# Patient Record
Sex: Female | Born: 1950 | Race: Black or African American | Hispanic: No | Smoking: Former smoker
Health system: Southern US, Community
[De-identification: ages and names within clinical notes are randomized; demographics above are authoritative.]

## PROBLEM LIST (undated history)

## (undated) DIAGNOSIS — F32A Depression, unspecified: Secondary | ICD-10-CM

## (undated) DIAGNOSIS — F419 Anxiety disorder, unspecified: Secondary | ICD-10-CM

## (undated) DIAGNOSIS — F1910 Other psychoactive substance abuse, uncomplicated: Secondary | ICD-10-CM

## (undated) DIAGNOSIS — M1990 Unspecified osteoarthritis, unspecified site: Secondary | ICD-10-CM

## (undated) DIAGNOSIS — D219 Benign neoplasm of connective and other soft tissue, unspecified: Secondary | ICD-10-CM

## (undated) DIAGNOSIS — K219 Gastro-esophageal reflux disease without esophagitis: Secondary | ICD-10-CM

## (undated) DIAGNOSIS — I10 Essential (primary) hypertension: Secondary | ICD-10-CM

## (undated) DIAGNOSIS — F329 Major depressive disorder, single episode, unspecified: Secondary | ICD-10-CM

## (undated) DIAGNOSIS — R0602 Shortness of breath: Secondary | ICD-10-CM

## (undated) DIAGNOSIS — I639 Cerebral infarction, unspecified: Secondary | ICD-10-CM

## (undated) HISTORY — DX: Other psychoactive substance abuse, uncomplicated: F19.10

## (undated) HISTORY — DX: Unspecified osteoarthritis, unspecified site: M19.90

## (undated) HISTORY — PX: CHOLECYSTECTOMY: SHX55

## (undated) HISTORY — PX: ABDOMINAL HYSTERECTOMY: SHX81

---

## 2010-01-27 ENCOUNTER — Ambulatory Visit: Payer: Self-pay | Admitting: Diagnostic Radiology

## 2010-01-27 ENCOUNTER — Emergency Department (HOSPITAL_BASED_OUTPATIENT_CLINIC_OR_DEPARTMENT_OTHER): Admission: EM | Admit: 2010-01-27 | Discharge: 2010-01-28 | Payer: Self-pay | Admitting: Emergency Medicine

## 2011-03-08 LAB — CBC
HCT: 40.2 % (ref 36.0–46.0)
MCHC: 33.7 g/dL (ref 30.0–36.0)
MCV: 93.8 fL (ref 78.0–100.0)
Platelets: 246 10*3/uL (ref 150–400)
RDW: 12.6 % (ref 11.5–15.5)

## 2011-03-08 LAB — BASIC METABOLIC PANEL
BUN: 11 mg/dL (ref 6–23)
Creatinine, Ser: 1.2 mg/dL (ref 0.4–1.2)
GFR calc Af Amer: 55 mL/min — ABNORMAL LOW (ref >60)
Sodium: 146 mEq/L — ABNORMAL HIGH (ref 135–145)

## 2011-03-08 LAB — DIFFERENTIAL
Eosinophils Absolute: 0.3 10*3/uL (ref 0.0–0.7)
Neutro Abs: 2 10*3/uL (ref 1.7–7.7)

## 2011-05-30 DIAGNOSIS — H35341 Macular cyst, hole, or pseudohole, right eye: Secondary | ICD-10-CM | POA: Insufficient documentation

## 2013-02-19 ENCOUNTER — Emergency Department (HOSPITAL_COMMUNITY)
Admission: EM | Admit: 2013-02-19 | Discharge: 2013-02-19 | Disposition: A | Discharge status: Home / Self Care | Payer: Medicare Other | Attending: Emergency Medicine | Admitting: Emergency Medicine

## 2013-02-19 ENCOUNTER — Encounter (HOSPITAL_COMMUNITY): Payer: Self-pay | Admitting: Emergency Medicine

## 2013-02-19 ENCOUNTER — Other Ambulatory Visit: Payer: Self-pay

## 2013-02-19 ENCOUNTER — Emergency Department (HOSPITAL_COMMUNITY): Payer: Medicare Other

## 2013-02-19 DIAGNOSIS — Z79899 Other long term (current) drug therapy: Secondary | ICD-10-CM | POA: Insufficient documentation

## 2013-02-19 DIAGNOSIS — R0982 Postnasal drip: Secondary | ICD-10-CM | POA: Insufficient documentation

## 2013-02-19 DIAGNOSIS — I10 Essential (primary) hypertension: Secondary | ICD-10-CM | POA: Insufficient documentation

## 2013-02-19 DIAGNOSIS — Z8673 Personal history of transient ischemic attack (TIA), and cerebral infarction without residual deficits: Secondary | ICD-10-CM | POA: Insufficient documentation

## 2013-02-19 DIAGNOSIS — R55 Syncope and collapse: Secondary | ICD-10-CM | POA: Insufficient documentation

## 2013-02-19 DIAGNOSIS — R0789 Other chest pain: Secondary | ICD-10-CM

## 2013-02-19 DIAGNOSIS — F411 Generalized anxiety disorder: Secondary | ICD-10-CM | POA: Insufficient documentation

## 2013-02-19 DIAGNOSIS — R42 Dizziness and giddiness: Secondary | ICD-10-CM

## 2013-02-19 DIAGNOSIS — R05 Cough: Secondary | ICD-10-CM | POA: Insufficient documentation

## 2013-02-19 DIAGNOSIS — R059 Cough, unspecified: Secondary | ICD-10-CM

## 2013-02-19 DIAGNOSIS — Z7982 Long term (current) use of aspirin: Secondary | ICD-10-CM | POA: Insufficient documentation

## 2013-02-19 DIAGNOSIS — R450 Nervousness: Secondary | ICD-10-CM | POA: Insufficient documentation

## 2013-02-19 DIAGNOSIS — R0602 Shortness of breath: Secondary | ICD-10-CM | POA: Insufficient documentation

## 2013-02-19 DIAGNOSIS — F419 Anxiety disorder, unspecified: Secondary | ICD-10-CM

## 2013-02-19 HISTORY — DX: Anxiety disorder, unspecified: F41.9

## 2013-02-19 HISTORY — DX: Essential (primary) hypertension: I10

## 2013-02-19 HISTORY — DX: Cerebral infarction, unspecified: I63.9

## 2013-02-19 LAB — BASIC METABOLIC PANEL
CO2: 33 mEq/L — ABNORMAL HIGH (ref 19–32)
Creatinine, Ser: 1.08 mg/dL (ref 0.50–1.10)
GFR calc Af Amer: 63 mL/min — ABNORMAL LOW (ref >90)
GFR calc non Af Amer: 54 mL/min — ABNORMAL LOW (ref >90)
Potassium: 4.6 mEq/L (ref 3.5–5.1)

## 2013-02-19 LAB — POCT I-STAT TROPONIN I
Troponin i, poc: 0.01 ng/mL (ref 0.00–0.08)
Troponin i, poc: 0 ng/mL (ref 0.00–0.08)

## 2013-02-19 LAB — CBC
HCT: 39 % (ref 36.0–46.0)
Hemoglobin: 13.2 g/dL (ref 12.0–15.0)
MCH: 30.3 pg (ref 26.0–34.0)
MCHC: 33.8 g/dL (ref 30.0–36.0)
MCV: 89.7 fL (ref 78.0–100.0)
Platelets: 202 K/uL (ref 150–400)
RBC: 4.35 MIL/uL (ref 3.87–5.11)
RDW: 12.9 % (ref 11.5–15.5)
WBC: 5.7 10*3/uL (ref 4.0–10.5)

## 2013-02-19 LAB — BASIC METABOLIC PANEL WITH GFR
BUN: 13 mg/dL (ref 6–23)
Calcium: 9.8 mg/dL (ref 8.4–10.5)
Chloride: 101 meq/L (ref 96–112)
Glucose, Bld: 81 mg/dL (ref 70–99)
Sodium: 140 meq/L (ref 135–145)

## 2013-02-19 MED ORDER — NITROGLYCERIN 0.4 MG SL SUBL
0.4000 mg | SUBLINGUAL_TABLET | SUBLINGUAL | Status: DC | PRN
  Administered 2013-02-19: 0.4 mg via SUBLINGUAL
  Filled 2013-02-19: qty 25

## 2013-02-19 MED ORDER — NITROGLYCERIN 2 % TD OINT
1.0000 [in_us] | TOPICAL_OINTMENT | Freq: Four times a day (QID) | TRANSDERMAL | Status: DC
Start: 2013-02-19 — End: 2013-02-19
  Administered 2013-02-19: 1 [in_us] via TOPICAL
  Filled 2013-02-19: qty 1

## 2013-02-19 MED ORDER — ALPRAZOLAM 0.25 MG PO TABS: 0.2500 mg | ORAL_TABLET | Freq: Three times a day (TID) | ORAL | Status: DC | PRN

## 2013-02-19 MED ORDER — ASPIRIN 325 MG PO TABS
325.0000 mg | ORAL_TABLET | ORAL | Status: DC
  Filled 2013-02-19: qty 1

## 2013-02-19 MED ORDER — FENTANYL CITRATE 0.05 MG/ML IJ SOLN
50.0000 ug | Freq: Once | INTRAMUSCULAR | Status: AC
  Administered 2013-02-19: 50 ug via INTRAVENOUS
  Filled 2013-02-19: qty 2

## 2013-02-19 MED ORDER — HYDROCOD POLST-CHLORPHEN POLST 10-8 MG/5ML PO LQCR: 5.0000 mL | Freq: Two times a day (BID) | ORAL | Status: DC | PRN

## 2013-02-19 MED ORDER — LORAZEPAM 2 MG/ML IJ SOLN
0.5000 mg | Freq: Once | INTRAMUSCULAR | Status: AC
  Administered 2013-02-19: 0.5 mg via INTRAVENOUS
  Filled 2013-02-19: qty 1

## 2013-02-19 NOTE — ED Notes (Signed)
Pt went to PMD this morning c/o chest pain, on EMS arrival pt anxious, crying. Left arm tingling for the last two years. Pain increasing over the last few weeks. Pt c/o left chest wall pain. Denies cardiac hx.

## 2013-02-19 NOTE — ED Notes (Signed)
X-ray at bedside

## 2013-02-19 NOTE — ED Provider Notes (Signed)
History     CSN: [id number]  Arrival date & time 02/19/13  1122   First MD Initiated Contact with Patient 02/19/13 1233      Chief Complaint  Patient presents with  . Chest Pain    (Consider location/radiation/quality/duration/timing/severity/associated sxs/prior treatment) HPI Comments: Patient with history of hypertension and anxiety and reportedly a stroke although she has no current deficit has had symptoms of dizziness, fainting due to cough and left-sided chest discomfort that radiates to her upper and posterior shoulder area. She denies sweats, nausea. Her cough has been dry but no fever or chills. She reports she has a history of cough syncope. She reports that she has been told by other providers that she likely has sinus problems causing the cough. She also has been told that she may have vertigo problems. She reports with exertion over the last few weeks, she does get shortness of breath more than usual but denies any worsening chest tightness. She denies heartburn. She and her daughter both admit that the patient has issues with anxiety. She does have Medicaid and Medicare but does not have a primary care physician. [facility] and was told that she should probably come to the emergency department for her symptoms. She did not want EMS called and her daughter decided to take her to [facility] walk-in clinic who evaluated the patient and also recommended that she be brought to the emergency department. She was given aspirin and sublingual nitroglycerin there at the clinic and reports her chest discomfort did improve from about an 810 to approximately a 5/10. She does smoke 3 or 4 cigarettes per day. She reports her mother had several medical problems including hypertension, diabetes and seems to indicate that she did have a heart attack at some point in her 81s or 62s.  Patient is a 62 y.o. female presenting with chest pain. The history is provided by the patient and a  relative.  Chest Pain Associated symptoms: cough, dizziness and shortness of breath   Associated symptoms: no diaphoresis, no fever and no nausea     Past Medical History  Diagnosis Date  . Anxiety   . Hypertension   . Stroke     History reviewed. No pertinent past surgical history.  History reviewed. No pertinent family history.  History  Substance Use Topics  . Smoking status: Not on file  . Smokeless tobacco: Not on file  . Alcohol Use: Not on file    OB History   Grav Para Term Preterm Abortions TAB SAB Ect Mult Living                  Review of Systems  Constitutional: Negative for fever, chills, diaphoresis and appetite change.  HENT: Positive for postnasal drip.   Respiratory: Positive for cough and shortness of breath.   Cardiovascular: Positive for chest pain.  Gastrointestinal: Negative for nausea and diarrhea.  Neurological: Positive for dizziness and syncope. Negative for seizures.  Psychiatric/Behavioral: The patient is nervous/anxious.   All other systems reviewed and are negative.    Allergies  Review of patient's allergies indicates no known allergies.  Home Medications   Current Outpatient Rx  Name  Route  Sig  Dispense  Refill  . aspirin 325 MG tablet   Oral   Take 325 mg by mouth daily.         . Naproxen Sodium (ALEVE PO)   Oral   Take 1 tablet by mouth daily as needed (for  pain.).         Marland Kitchen valsartan-hydrochlorothiazide (DIOVAN-HCT) 160-25 MG per tablet   Oral   Take 1 tablet by mouth daily.         Marland Kitchen ALPRAZolam (XANAX) 0.25 MG tablet   Oral   Take 1 tablet (0.25 mg total) by mouth 3 (three) times daily as needed for sleep or anxiety.   15 tablet   0   . chlorpheniramine-HYDROcodone (TUSSIONEX PENNKINETIC ER) 10-8 MG/5ML LQCR   Oral   Take 5 mLs by mouth every 12 (twelve) hours as needed.   80 mL   0     BP 125/74  Pulse 86  Temp(Src) 98.6 F (37 C) (Oral)  Resp 17  SpO2 96%  Physical Exam  Nursing note and  vitals reviewed. Constitutional: She appears well-developed and well-nourished. No distress.  HENT:  Head: Normocephalic and atraumatic.  Eyes: No scleral icterus.  Neck: Normal range of motion. Neck supple.  Cardiovascular: Normal rate and regular rhythm.   No murmur heard. Pulmonary/Chest: Effort normal. No respiratory distress.  Abdominal: Soft. She exhibits no distension. There is no tenderness. There is no rebound.  Musculoskeletal: She exhibits no edema.  Neurological: She is alert. No cranial nerve deficit. Coordination normal.  Skin: Skin is warm and dry. She is not diaphoretic.    ED Course  Procedures (including critical care time)  Labs Reviewed  BASIC METABOLIC PANEL - Abnormal; Notable for the following:    CO2 33 (*)    GFR calc non Af Amer 54 (*)    GFR calc Af Amer 63 (*)    All other components within normal limits  CBC  POCT I-STAT TROPONIN I  POCT I-STAT TROPONIN I   Dg Chest Port 1 View  02/19/2013  *RADIOLOGY REPORT*  Clinical Data: Chest pain, left axilla pain  PORTABLE CHEST - 1 VIEW  Comparison: None.  Findings: Cardiomediastinal silhouette is unremarkable.  No acute infiltrate or pleural effusion.  No pulmonary edema.  Mild degenerative changes left shoulder.  IMPRESSION: No active disease.   Original Report Authenticated By: Natasha Mead, M.D.      1. Musculoskeletal chest pain   2. Dizziness   3. Cough   4. Anxiety     EKG performed at time 11:30, shows sinus rhythm at a rate of 69, normal axis, normal intervals. Nonspecific diffuse ST elevation in inferior and lateral leads with no reciprocal changes. Interpretation is borderline EKG. No priors are available.  Room air saturation is 100% I interpret this to be normal.  4:13 PM Pt now tells me that pain is still improved, yet present at 3/10.  No change with nitropaste.  She has been coughing more in the room and affirms that coughing is making the CP worse.  She now has some reproducible CP on my  exam and I think her CP is more musculoskeletal.  If 2nd troponin is neg, will d/c with MSK CP and cough as diagnoses which fits with her atypical symptoms, no ischemia on ECG and neg troponin.    MDM  Patient's symptoms are several and not consistent with acute coronary syndrome. She does display symptoms of anxiety and has a history of same. Her EKG has some subtle nonspecific changes but no overt changes for ischemia. However given her age and risk factors and nonspecific EKG, she may warrant brief admission for cardiac evaluation. She does not currently have a primary care physician.  Continues to have CP, will give ativan and further  NTG.          Gavin Pound. Oletta Lamas, MD 02/19/13 1806

## 2013-02-19 NOTE — ED Notes (Signed)
Nitroglycerin topical ointment placed on right shoulder.

## 2013-02-19 NOTE — ED Notes (Signed)
Pt crying and appears very anxious. States her mother died very young because of her heart.

## 2013-02-19 NOTE — Discharge Instructions (Signed)
 Cough, Adult  A cough is a reflex that helps clear your throat and airways. It can help heal the body or may be a reaction to an irritated airway. A cough may only last 2 or 3 weeks (acute) or may last more than 8 weeks (chronic).  CAUSES Acute cough:  Viral or bacterial infections. Chronic cough:  Infections.  Allergies.  Asthma.  Post-nasal drip.  Smoking.  Heartburn or acid reflux.  Some medicines.  Chronic lung problems (COPD).  Cancer. SYMPTOMS   Cough.  Fever.  Chest pain.  Increased breathing rate.  High-pitched whistling sound when breathing (wheezing).  Colored mucus that you cough up (sputum). TREATMENT   A bacterial cough may be treated with antibiotic medicine.  A viral cough must run its course and will not respond to antibiotics.  Your caregiver may recommend other treatments if you have a chronic cough. HOME CARE INSTRUCTIONS   Only take over-the-counter or prescription medicines for pain, discomfort, or fever as directed by your caregiver. Use cough suppressants only as directed by your caregiver.  Use a cold steam vaporizer or humidifier in your bedroom or home to help loosen secretions.  Sleep in a semi-upright position if your cough is worse at night.  Rest as needed.  Stop smoking if you smoke. SEEK IMMEDIATE MEDICAL CARE IF:   You have pus in your sputum.  Your cough starts to worsen.  You cannot control your cough with suppressants and are losing sleep.  You begin coughing up blood.  You have difficulty breathing.  You develop pain which is getting worse or is uncontrolled with medicine.  You have a fever. MAKE SURE YOU:   Understand these instructions.  Will watch your condition.  Will get help right away if you are not doing well or get worse. Document Released: 06/01/2011 Document Revised: 02/25/2012 Document Reviewed: 06/01/2011 [facility] Patient Information 2013 [place], [place].     Dizziness Dizziness is a  common problem. It is a feeling of unsteadiness or lightheadedness. You may feel like you are about to faint. Dizziness can lead to injury if you stumble or fall. A person of any age group can suffer from dizziness, but dizziness is more common in older adults. CAUSES  Dizziness can be caused by many different things, including:  Middle ear problems.  Standing for too long.  Infections.  An allergic reaction.  Aging.  An emotional response to something, such as the sight of blood.  Side effects of medicines.  Fatigue.  Problems with circulation or blood pressure.  Excess use of alcohol, medicines, or illegal drug use.  Breathing too fast (hyperventilation).  An arrhythmia or problems with your heart rhythm.  Low red blood cell count (anemia).  Pregnancy.  Vomiting, diarrhea, fever, or other illnesses that cause dehydration.  Diseases or conditions such as Parkinson's disease, high blood pressure (hypertension), diabetes, and thyroid  problems.  Exposure to extreme heat. DIAGNOSIS  To find the cause of your dizziness, your caregiver may do a physical exam, lab tests, radiologic imaging scans, or an electrocardiography test (ECG).  TREATMENT  Treatment of dizziness depends on the cause of your symptoms and can vary greatly. HOME CARE INSTRUCTIONS   Drink enough fluids to keep your urine clear or pale yellow. This is especially important in very hot weather. In the elderly, it is also important in cold weather.  If your dizziness is caused by medicines, take them exactly as directed. When taking blood pressure medicines, it is especially important to get  up slowly.  Rise slowly from chairs and steady yourself until you feel okay.  In the morning, first sit up on the side of the bed. When this seems okay, stand slowly while holding onto something until you know your balance is fine.  If you need to stand in one place for a long time, be sure to move your legs often.  Tighten and relax the muscles in your legs while standing.  If dizziness continues to be a problem, have someone stay with you for a day or two. Do this until you feel you are well enough to stay alone. Have the person call your caregiver if he or she notices changes in you that are concerning.  Do not drive or use heavy machinery if you feel dizzy.  Do not drink alcohol. SEEK IMMEDIATE MEDICAL CARE IF:   Your dizziness or lightheadedness gets worse.  You feel nauseous or vomit.  You develop problems with talking, walking, weakness, or using your arms, hands, or legs.  You are not thinking clearly or you have difficulty forming sentences. It may take a friend or family member to determine if your thinking is normal.  You develop chest pain, abdominal pain, shortness of breath, or sweating.  Your vision changes.  You notice any bleeding.  You have side effects from medicine that seems to be getting worse rather than better. MAKE SURE YOU:   Understand these instructions.  Will watch your condition.  Will get help right away if you are not doing well or get worse. Document Released: 05/29/2001 Document Revised: 02/25/2012 Document Reviewed: 06/22/2011 [facility] Patient Information 2013 [place], [place].    RESOURCE GUIDE  Chronic Pain Problems: Contact [facility]  [phone] Patients need to be referred by their primary care doctor.  Insufficient Money for Medicine: Contact United Way:  call 211.   No Primary Care Doctor: - Call Health Connect  [phone] - can help you locate a primary care doctor that  accepts your insurance, provides certain services, etc. - Physician Referral [phone]  Agencies that provide inexpensive medical care: - [facility]  [phone] - [facility]  [phone] - [facility]  [phone] - Women's Clinic  [phone] - Planned Parenthood  [phone] [facility]   [phone]  Medicaid-accepting [facility] Providers: - [facility]- [address], [address]  [phone], Mon-Fri 9am-7pm, Sat 9am-1pm - [facility]- [address] [place], [address] [place]  [phone] - [facility]- [address] [place]  [phone] [facility]- [address]  [phone] - Kennieth Leech- [address] [place], [address], [phone]  Only accepts [place] patients after they have their name  applied to their card  Self Pay (no insurance) in [place]: - Sickle Cell Patients: Dr Camellia Hutchinson, [facility]  [address] [place], [phone] - [facility]- [address] [place]  [phone]       [facility]- [address]       -     [facility]- see information above (Speak to [employer] if you do not have insurance)       -  [facility]- [address],  [phone]       -  [facility]- [address],  [phone]       -  Dr Catalina-  [address], [place], [phone]       -  [facility] - [address] [phone]       -  [facility]- [address] [phone], also [address] [phone]       -    [facility]- [address] [place], [phone], 1st & 3rd Saturday        every month, 10am-1pm  [facility] [address] [place], [place] [address] [phone]  [facility] [address] [place], [place] [address] [phone]  1) Find a Doctor and Pay Out of Pocket Although you won't have to find out who is covered by your insurance plan, it is a good idea to ask around and get recommendations. You will then need to call the office and see if the doctor you have chosen will accept you as a new patient and what types of options they offer for patients who are  self-pay. Some doctors offer discounts or will set up payment plans for their patients who do not have insurance, but you will need to ask so you aren't surprised when you get to your appointment.  2) Contact Your Local Health Department Not all health departments have doctors that can see patients for sick visits, but many do, so it is worth a call to see if yours does. If you don't know where your local health department is, you can check in your phone book. The CDC also has a tool to help you locate your state's health department, and many state websites also have listings of all of their local health departments.  3) Find a Walk-in Clinic If your illness is not likely to be very severe or complicated, you may want to try a walk in clinic. These are popping up all over the country in pharmacies, drugstores, and shopping centers. They're usually staffed by nurse practitioners or physician assistants that have been trained to treat common illnesses and complaints. They're usually fairly quick and inexpensive. However, if you have serious medical issues or chronic medical problems, these are probably not your best option  STD Testing - [facility] Department of [facility], STD Clinic, [address], [place], phone [phone] or [phone].  Monday - Friday, call for an appointment. [facility] Department of [employer], STD Clinic, [address], [place], phone [phone] or [phone].  Monday - Friday, call for an appointment.  Abuse/Neglect: [facility] Child Abuse Hotline (318)743-8651 [facility] Child Abuse Hotline 250-762-1044 (After Hours)  Emergency Shelter:  [facility] [phone]  Maternity Homes: - Room at the [place] [facility] [phone] - [facility] ([phone]  MRSA Hotline #:   3173284084  Dental Assistance If unable to pay or uninsured, contact:  [facility]. to become qualified for the adult dental clinic.  Patients with Medicaid: [facility] [phone] [address], [phone] [address] [phone]  If unable to pay, or uninsured, contact [facility] [phone] in [place], [phone] in [facility]) to become qualified for the adult dental clinic  [employer] [address] [place], [place] [address] [phone] [email or web address]  Other Proofreader Services: Chartered loss adjuster [facility]- [address], [place],  [place], [address], [phone], Ext. [phone], 2nd and 4th Thursday of the month at 6:30am.  10 clients each day by appointment, can sometimes see walk-in patients if someone does not show for an appointment. [facility]- [address] [place], [place], [address], [phone] - [facility] [address] [place], [place], [address], [phone] - [facility]- [phone] [facility] Health Department- [phone] [facility] Health [phone]       Behavioral Health Resources in the [facility]  Intensive Outpatient Programs: [facility]      [address] [place], [place] [phone] Both a day and evening program       [facility] Outpatient     [address]        [place], [place] [address] [phone]         ADS: [facility] Drug [facility] [address] [place] [phone]  [facility] Mental Health ACCESS LINE: [phone] or [phone] [address] [place], [place] [address] [email or web address]   Substance Abuse Resources: - Alcohol [facility] Drug Services  ([phone] - [facility] [phone] - [facility] [place] [phone] [facility] [place] [phone] - Residential & Outpatient Substance Abuse Program  ([phone]  Psychological Services: [facility]   [phone] [facility]  [phone] - [facility], [phone] [place]. [address], [place], ACCESS LINE: [phone] or [phone], [email or web address]  Mobile Crisis Teams:                                        Therapeutic Alternatives         Mobile Crisis Care Unit ([phone]             [facility] [address] [place] [phone]                                         Interventionist [address] [place] [address] [place] [phone]  Self-Help/Support Groups: Mental Health Assoc. of [employer] of support groups 339-191-6567 (call for more info)   Narcotics Anonymous (NA) Caring [facility] [address] [place] - 2 meetings at this location  Residential Treatment Programs:  [facility]      [address]        [place]       [phone]         [facility] [address] [place] [address]81 [place], [place]  [address] [phone]  [facility]  [address] [place], [place] [address] [phone] Admissions: 8am-3pm M-F  [facility]     [address]        [place], [place] [address]       [phone]         [facility] [address] Christianna COYER [place], [place] [phone]  [facility] [address] [place], [place] [phone]  [facility] - Intensive Outpatient      [address]     [place], [place]       [phone]         [facility] (Addiction Recovery Care [facility].)     [address] [place], [place]  [phone] or [phone]  Residential Treatment Services (RTS), Medicaid [address] [place], [place] [phone]  Fellowship [address]                                               [address] [place] [phone]  [facility] Resources: [phone]                General Therapy                                                Mliss Rival, PhD        [address] [place], [place] [address]         [phone]   Insurance  [facility] - Closed Behavioral   [address] [place], [place] [address] [phone]  [facility] Recovery [address] [place], [place] [address] [phone] Insurance/Medicaid/sponsorship through [facility] and Families                                              [address]                                        [place], [place] [address]    Therapy/tele-psych/case         [phone]          [facility] [address], [place]  [address]  Adolescent/group home/case management [phone]                                           Recardo Rival PhD       General therapy       Insurance   [phone]         Dr. Curry, [place], M-F [phone]  [facility] [place]  [employer] [facility] Dept. [address]                 [address]         [address] [place] [address]  [place]                                               Keenan Keenan Phone:  [phone]  Phone:  [phone]                   Phone:  [phone]  [facility], [phone] - [facility] - [phone]       -     [facility] in [place], [address]             [phone], Insurance  [place] Child Abuse Hotline 270-214-8688 or [phone] (After Hours)

## 2013-03-05 ENCOUNTER — Emergency Department (INDEPENDENT_AMBULATORY_CARE_PROVIDER_SITE_OTHER)
Admission: EM | Admit: 2013-03-05 | Discharge: 2013-03-05 | Disposition: A | Discharge status: Home / Self Care | Payer: Medicare Other | Source: Home / Self Care

## 2013-03-05 ENCOUNTER — Encounter (HOSPITAL_COMMUNITY): Payer: Self-pay

## 2013-03-05 DIAGNOSIS — R071 Chest pain on breathing: Secondary | ICD-10-CM

## 2013-03-05 MED ORDER — IBUPROFEN 600 MG PO TABS: 600.0000 mg | ORAL_TABLET | Freq: Four times a day (QID) | ORAL | Status: DC | PRN

## 2013-03-05 NOTE — ED Notes (Signed)
Hospital follow up?

## 2013-03-05 NOTE — ED Notes (Signed)
Patient Demographics  Deborah Whitehead, is a 62 y.o. female  ZOX:[id number]  WJX:[id number]  DOB - August 10, 1951  Chief Complaint  Patient presents with  . Follow-up        Subjective:   Deborah Whitehead  with a history of smoking, who was recently in the ER for left-sided chest pain which has been ongoing for one month, in the ER EKG troponins were negative, she currently has costochondritis on exam. Denies any palpitations or shortness of breath, worse with touching, no correlation with exertion. Denies any previous heart or breast issues.  Objective:    Filed Vitals:   03/05/13 1408  BP: 147/90  Pulse: 84  Temp: 97.6 F (36.4 C)  TempSrc: Oral  SpO2: 100%     Exam  Awake Alert, Oriented X 3, No new F.N deficits, Normal affect [place].AT,PERRAL Supple Neck,No JVD, No cervical lymphadenopathy appriciated.  Symmetrical Chest wall movement, Good air movement bilaterally, CTAB, point tenderness in the left rib cage area above her breast around the third and fourth ribs. RRR,No Gallops,Rubs or new Murmurs, No Parasternal Heave +ve B.Sounds, Abd Soft, Non tender, No organomegaly appriciated, No rebound - guarding or rigidity. No Cyanosis, Clubbing or edema, No new Rash or bruise       Data Review   CBC No results found for this basename: WBC, HGB, HCT, PLT, MCV, MCH, MCHC, RDW, NEUTRABS, LYMPHSABS, MONOABS, EOSABS, BASOSABS, BANDABS, BANDSABD,  in the last 168 hours  Chemistries   No results found for this basename: NA, K, CL, CO2, GLUCOSE, BUN, CREATININE, GFRCGP, CALCIUM, MG, AST, ALT, ALKPHOS, BILITOT,  in the last 168 hours ------------------------------------------------------------------------------------------------------------------ No results found for this basename: HGBA1C,  in the last 72 hours ------------------------------------------------------------------------------------------------------------------ No results found for this basename: CHOL, HDL, LDLCALC,  TRIG, CHOLHDL, LDLDIRECT,  in the last 72 hours ------------------------------------------------------------------------------------------------------------------ No results found for this basename: TSH, T4TOTAL, FREET3, T3FREE, THYROIDAB,  in the last 72 hours ------------------------------------------------------------------------------------------------------------------ No results found for this basename: VITAMINB12, FOLATE, FERRITIN, TIBC, IRON, RETICCTPCT,  in the last 72 hours  Coagulation profile  No results found for this basename: INR, PROTIME,  in the last 168 hours     Prior to Admission medications   Medication Sig Start Date End Date Taking? Authorizing Provider  ALPRAZolam (XANAX) 0.25 MG tablet Take 1 tablet (0.25 mg total) by mouth 3 (three) times daily as needed for sleep or anxiety. 02/19/13   Gavin Pound. Ghim, MD  aspirin 325 MG tablet Take 325 mg by mouth daily.    Historical Provider, MD  chlorpheniramine-HYDROcodone (TUSSIONEX PENNKINETIC ER) 10-8 MG/5ML LQCR Take 5 mLs by mouth every 12 (twelve) hours as needed. 02/19/13   Gavin Pound. Ghim, MD  ibuprofen (MOTRIN IB) 600 MG tablet Take 1 tablet (600 mg total) by mouth every 6 (six) hours as needed for pain. 03/05/13   Leroy Sea, MD  valsartan-hydrochlorothiazide (DIOVAN-HCT) 160-25 MG per tablet Take 1 tablet by mouth daily.    Historical Provider, MD     Assessment & Plan     CostoChondritis of the left chest wall. Patient was recently in the ER where EKG and troponins were negative, pain has been going for one month, she has point tenderness, she would be prescribed Motrin, come back in a month.   Hypertension stable continue home medications.   Smoking counseled to quit smoking.   Patient needs referral for mammogram, woman's health referral form provided.   Follow-up Information   Follow up with this clinic. Schedule an appointment  as soon as possible for a visit in 1 month.       Leroy Sea M.D on 03/05/2013 at 2:30 PM   Leroy Sea, MD 03/05/13 [phone]

## 2013-05-06 ENCOUNTER — Encounter (INDEPENDENT_AMBULATORY_CARE_PROVIDER_SITE_OTHER): Payer: Self-pay

## 2013-05-13 ENCOUNTER — Ambulatory Visit (INDEPENDENT_AMBULATORY_CARE_PROVIDER_SITE_OTHER): Payer: Medicare Other | Admitting: General Surgery

## 2013-05-13 ENCOUNTER — Encounter (INDEPENDENT_AMBULATORY_CARE_PROVIDER_SITE_OTHER): Payer: Self-pay | Admitting: General Surgery

## 2013-05-13 VITALS — BP 130/80 | HR 80 | Temp 98.6°F | Resp 18 | Ht 63.0 in | Wt 221.4 lb

## 2013-05-13 DIAGNOSIS — R109 Unspecified abdominal pain: Secondary | ICD-10-CM | POA: Insufficient documentation

## 2013-05-13 NOTE — Patient Instructions (Signed)
Will get CT abdomen and pelvis to look for hernia

## 2013-05-13 NOTE — Progress Notes (Signed)
Subjective:     Patient ID: Deborah Whitehead, female   DOB: August 30, 1951, 62 y.o.   MRN: [id number]  HPI We are asked to see the patient in consultation by Dr. Pecola Leisure to evaluate her for an abdominal wall hernia. The patient is a 62 year old black female who states that she has had a bulge at the upper portion of her midline abdominal incision for about the last 5 years. During that time it does not seem to have gotten larger but she seems to be having more discomfort associated with it. She has had several episodes of nausea and vomiting. Her bowels are moving regularly and her appetite is good.  Review of Systems  Constitutional: Negative.   HENT: Negative.   Eyes: Negative.   Respiratory: Positive for cough.   Cardiovascular: Negative.   Gastrointestinal: Positive for abdominal pain.  Endocrine: Negative.   Genitourinary: Negative.   Musculoskeletal: Negative.   Skin: Negative.   Allergic/Immunologic: Negative.   Neurological: Negative.   Hematological: Negative.   Psychiatric/Behavioral: Negative.        Objective:   Physical Exam  Constitutional: She is oriented to person, place, and time. She appears well-developed and well-nourished.  HENT:  Head: Normocephalic and atraumatic.  Eyes: Conjunctivae and EOM are normal. Pupils are equal, round, and reactive to light.  Neck: Normal range of motion. Neck supple.  Cardiovascular: Normal rate, regular rhythm and normal heart sounds.   Pulmonary/Chest: Effort normal and breath sounds normal.  Abdominal: Soft. Bowel sounds are normal.  There is some slight fullness at the upper portion of her midline incision but I cannot appreciate a palpable fascial defect or any reducible hernia  Musculoskeletal: Normal range of motion.  Neurological: She is alert and oriented to person, place, and time.  Skin: Skin is warm and dry.  Psychiatric: She has a normal mood and affect. Her behavior is normal.       Assessment:     The patient has  some abdominal pain at the upper portion of her old incision but I cannot definitively appreciate a fascial defect consistent with a hernia. Because of this I think she should be evaluated with a CT scan of her abdomen and pelvis to look at her anatomy. If she does have a hernia I have discussed with her the different options for repairing it including open versus laparoscopic. I think she would be a good laparoscopic candidate. I have discussed with her in detail the risks and benefits of the operation to fix the hernia laparoscopically as well as some of the technical aspects and she understands and wishes to proceed     Plan:     We will plan to obtain a CT scan of her abdomen and pelvis and then proceed accordingly. We will call her with the results of the study

## 2013-05-15 ENCOUNTER — Ambulatory Visit
Admission: RE | Admit: 2013-05-15 | Discharge: 2013-05-15 | Disposition: A | Discharge status: Home / Self Care | Payer: Medicare Other | Source: Ambulatory Visit | Attending: General Surgery | Admitting: General Surgery

## 2013-05-15 ENCOUNTER — Other Ambulatory Visit (INDEPENDENT_AMBULATORY_CARE_PROVIDER_SITE_OTHER): Payer: Self-pay | Admitting: General Surgery

## 2013-05-15 DIAGNOSIS — R109 Unspecified abdominal pain: Secondary | ICD-10-CM

## 2013-05-15 MED ORDER — IOHEXOL 300 MG/ML  SOLN
125.0000 mL | Freq: Once | INTRAMUSCULAR | Status: AC | PRN
  Administered 2013-05-15: 125 mL via INTRAVENOUS

## 2013-06-10 ENCOUNTER — Encounter (HOSPITAL_COMMUNITY): Payer: Self-pay | Admitting: Pharmacy Technician

## 2013-06-17 ENCOUNTER — Encounter (HOSPITAL_COMMUNITY): Payer: Self-pay

## 2013-06-17 ENCOUNTER — Ambulatory Visit (HOSPITAL_COMMUNITY)
Admission: RE | Admit: 2013-06-17 | Discharge: 2013-06-17 | Disposition: A | Discharge status: Home / Self Care | Payer: Medicare Other | Source: Ambulatory Visit | Attending: General Surgery | Admitting: General Surgery

## 2013-06-17 ENCOUNTER — Encounter (HOSPITAL_COMMUNITY)
Admission: RE | Admit: 2013-06-17 | Discharge: 2013-06-17 | Disposition: A | Discharge status: Home / Self Care | Payer: Medicare Other | Source: Ambulatory Visit | Attending: General Surgery | Admitting: General Surgery

## 2013-06-17 DIAGNOSIS — M954 Acquired deformity of chest and rib: Secondary | ICD-10-CM | POA: Insufficient documentation

## 2013-06-17 DIAGNOSIS — Z01818 Encounter for other preprocedural examination: Secondary | ICD-10-CM | POA: Insufficient documentation

## 2013-06-17 DIAGNOSIS — Z01812 Encounter for preprocedural laboratory examination: Secondary | ICD-10-CM | POA: Insufficient documentation

## 2013-06-17 DIAGNOSIS — K469 Unspecified abdominal hernia without obstruction or gangrene: Secondary | ICD-10-CM | POA: Insufficient documentation

## 2013-06-17 HISTORY — DX: Depression, unspecified: F32.A

## 2013-06-17 HISTORY — DX: Major depressive disorder, single episode, unspecified: F32.9

## 2013-06-17 HISTORY — DX: Shortness of breath: R06.02

## 2013-06-17 HISTORY — DX: Benign neoplasm of connective and other soft tissue, unspecified: D21.9

## 2013-06-17 HISTORY — DX: Gastro-esophageal reflux disease without esophagitis: K21.9

## 2013-06-17 LAB — BASIC METABOLIC PANEL
Calcium: 9.6 mg/dL (ref 8.4–10.5)
Chloride: 97 mEq/L (ref 96–112)
Creatinine, Ser: 1.14 mg/dL — ABNORMAL HIGH (ref 0.50–1.10)
GFR calc Af Amer: 59 mL/min — ABNORMAL LOW (ref >90)

## 2013-06-17 LAB — CBC
MCV: 92.2 fL (ref 78.0–100.0)
Platelets: 234 10*3/uL (ref 150–400)
RDW: 13.1 % (ref 11.5–15.5)
WBC: 5.4 10*3/uL (ref 4.0–10.5)

## 2013-06-24 MED ORDER — CHLORHEXIDINE GLUCONATE 4 % EX LIQD: 1.0000 "application " | Freq: Once | CUTANEOUS | Status: DC

## 2013-06-24 MED ORDER — CEFAZOLIN SODIUM-DEXTROSE 2-3 GM-% IV SOLR
2.0000 g | INTRAVENOUS | Status: AC
  Administered 2013-06-25: 2 g via INTRAVENOUS
  Filled 2013-06-24: qty 50

## 2013-06-25 ENCOUNTER — Ambulatory Visit (HOSPITAL_COMMUNITY): Payer: Medicare Other | Admitting: Anesthesiology

## 2013-06-25 ENCOUNTER — Encounter (HOSPITAL_COMMUNITY)
Admission: RE | Disposition: A | Discharge status: Home / Self Care | Payer: Self-pay | Source: Ambulatory Visit | Attending: General Surgery

## 2013-06-25 ENCOUNTER — Encounter (HOSPITAL_COMMUNITY): Payer: Self-pay | Admitting: *Deleted

## 2013-06-25 ENCOUNTER — Encounter (HOSPITAL_COMMUNITY): Payer: Self-pay | Admitting: Anesthesiology

## 2013-06-25 ENCOUNTER — Inpatient Hospital Stay (HOSPITAL_COMMUNITY)
Admission: RE | Admit: 2013-06-25 | Discharge: 2013-06-29 | DRG: 355 | Disposition: A | Discharge status: Home / Self Care | Payer: Medicare Other | Source: Ambulatory Visit | Attending: General Surgery | Admitting: General Surgery

## 2013-06-25 DIAGNOSIS — R109 Unspecified abdominal pain: Secondary | ICD-10-CM

## 2013-06-25 DIAGNOSIS — K439 Ventral hernia without obstruction or gangrene: Principal | ICD-10-CM | POA: Diagnosis present

## 2013-06-25 HISTORY — PX: INSERTION OF MESH: SHX5868

## 2013-06-25 HISTORY — PX: VENTRAL HERNIA REPAIR: SHX424

## 2013-06-25 SURGERY — REPAIR, HERNIA, VENTRAL, LAPAROSCOPIC
Anesthesia: General | Site: Abdomen | Wound class: Clean

## 2013-06-25 MED ORDER — SODIUM CHLORIDE 0.9 % IR SOLN
Status: DC | PRN
  Administered 2013-06-25: 1000 mL

## 2013-06-25 MED ORDER — MORPHINE SULFATE 4 MG/ML IJ SOLN
INTRAMUSCULAR | Status: AC
  Filled 2013-06-25: qty 1

## 2013-06-25 MED ORDER — HYDROMORPHONE HCL PF 1 MG/ML IJ SOLN
INTRAMUSCULAR | Status: AC
  Filled 2013-06-25: qty 1

## 2013-06-25 MED ORDER — ALPRAZOLAM 0.25 MG PO TABS
0.2500 mg | ORAL_TABLET | Freq: Three times a day (TID) | ORAL | Status: DC | PRN
  Administered 2013-06-25: 0.25 mg via ORAL
  Filled 2013-06-25: qty 1

## 2013-06-25 MED ORDER — IRBESARTAN 150 MG PO TABS
150.0000 mg | ORAL_TABLET | Freq: Every day | ORAL | Status: DC
  Administered 2013-06-25 – 2013-06-29 (×4): 150 mg via ORAL
  Filled 2013-06-25 (×6): qty 1

## 2013-06-25 MED ORDER — ASPIRIN EC 81 MG PO TBEC
81.0000 mg | DELAYED_RELEASE_TABLET | Freq: Every day | ORAL | Status: DC
  Administered 2013-06-26 – 2013-06-29 (×4): 81 mg via ORAL
  Filled 2013-06-25 (×5): qty 1

## 2013-06-25 MED ORDER — ONDANSETRON HCL 4 MG PO TABS: 4.0000 mg | ORAL_TABLET | Freq: Four times a day (QID) | ORAL | Status: DC | PRN

## 2013-06-25 MED ORDER — PROPOFOL 10 MG/ML IV BOLUS
INTRAVENOUS | Status: DC | PRN
  Administered 2013-06-25: 150 mg via INTRAVENOUS

## 2013-06-25 MED ORDER — MIDAZOLAM HCL 2 MG/2ML IJ SOLN
INTRAMUSCULAR | Status: AC
  Filled 2013-06-25: qty 2

## 2013-06-25 MED ORDER — HYDROMORPHONE HCL PF 1 MG/ML IJ SOLN
0.2500 mg | INTRAMUSCULAR | Status: DC | PRN
  Administered 2013-06-25 (×4): 0.5 mg via INTRAVENOUS

## 2013-06-25 MED ORDER — NEOSTIGMINE METHYLSULFATE 1 MG/ML IJ SOLN
INTRAMUSCULAR | Status: DC | PRN
  Administered 2013-06-25: 4 mg via INTRAVENOUS

## 2013-06-25 MED ORDER — MIDAZOLAM HCL 5 MG/5ML IJ SOLN
INTRAMUSCULAR | Status: DC | PRN
  Administered 2013-06-25: 2 mg via INTRAVENOUS

## 2013-06-25 MED ORDER — VALSARTAN-HYDROCHLOROTHIAZIDE 160-25 MG PO TABS: 1.0000 | ORAL_TABLET | Freq: Every day | ORAL | Status: DC

## 2013-06-25 MED ORDER — FENTANYL CITRATE 0.05 MG/ML IJ SOLN
INTRAMUSCULAR | Status: DC | PRN
  Administered 2013-06-25 (×2): 50 ug via INTRAVENOUS
  Administered 2013-06-25: 100 ug via INTRAVENOUS
  Administered 2013-06-25: 50 ug via INTRAVENOUS

## 2013-06-25 MED ORDER — BUPIVACAINE-EPINEPHRINE 0.25% -1:200000 IJ SOLN
INTRAMUSCULAR | Status: DC | PRN
  Administered 2013-06-25: 14 mL

## 2013-06-25 MED ORDER — ONDANSETRON HCL 4 MG/2ML IJ SOLN
4.0000 mg | Freq: Four times a day (QID) | INTRAMUSCULAR | Status: DC | PRN
  Administered 2013-06-25 (×2): 4 mg via INTRAVENOUS
  Filled 2013-06-25 (×2): qty 2

## 2013-06-25 MED ORDER — MIDAZOLAM HCL 2 MG/2ML IJ SOLN
0.5000 mg | Freq: Once | INTRAMUSCULAR | Status: AC
  Administered 2013-06-25: 0.5 mg via INTRAVENOUS

## 2013-06-25 MED ORDER — HYDROCHLOROTHIAZIDE 25 MG PO TABS
25.0000 mg | ORAL_TABLET | Freq: Every day | ORAL | Status: DC
  Administered 2013-06-25 – 2013-06-29 (×4): 25 mg via ORAL
  Filled 2013-06-25 (×5): qty 1

## 2013-06-25 MED ORDER — MORPHINE SULFATE 4 MG/ML IJ SOLN
4.0000 mg | INTRAMUSCULAR | Status: DC | PRN
  Administered 2013-06-25 (×3): 4 mg via INTRAVENOUS
  Filled 2013-06-25: qty 1

## 2013-06-25 MED ORDER — ROCURONIUM BROMIDE 100 MG/10ML IV SOLN
INTRAVENOUS | Status: DC | PRN
  Administered 2013-06-25: 50 mg via INTRAVENOUS

## 2013-06-25 MED ORDER — ONDANSETRON HCL 4 MG/2ML IJ SOLN
INTRAMUSCULAR | Status: DC | PRN
  Administered 2013-06-25: 4 mg via INTRAVENOUS

## 2013-06-25 MED ORDER — GLYCOPYRROLATE 0.2 MG/ML IJ SOLN
INTRAMUSCULAR | Status: DC | PRN
  Administered 2013-06-25: 0.6 mg via INTRAVENOUS

## 2013-06-25 MED ORDER — KCL IN DEXTROSE-NACL 20-5-0.9 MEQ/L-%-% IV SOLN
INTRAVENOUS | Status: DC
  Administered 2013-06-25 – 2013-06-26 (×2): via INTRAVENOUS
  Filled 2013-06-25 (×7): qty 1000

## 2013-06-25 MED ORDER — LACTATED RINGERS IV SOLN
INTRAVENOUS | Status: DC | PRN
  Administered 2013-06-25: 07:00:00 via INTRAVENOUS

## 2013-06-25 MED ORDER — BUPIVACAINE-EPINEPHRINE PF 0.25-1:200000 % IJ SOLN
INTRAMUSCULAR | Status: AC
  Filled 2013-06-25: qty 30

## 2013-06-25 MED ORDER — LIDOCAINE HCL (CARDIAC) 20 MG/ML IV SOLN
INTRAVENOUS | Status: DC | PRN
  Administered 2013-06-25: 60 mg via INTRAVENOUS

## 2013-06-25 MED ORDER — HYDROCODONE-ACETAMINOPHEN 5-325 MG PO TABS
1.0000 | ORAL_TABLET | ORAL | Status: DC | PRN
Start: 2013-06-25 — End: 2013-06-29
  Administered 2013-06-25: 2 via ORAL
  Administered 2013-06-25 – 2013-06-26 (×2): 1 via ORAL
  Administered 2013-06-26 – 2013-06-29 (×10): 2 via ORAL
  Filled 2013-06-25 (×13): qty 2

## 2013-06-25 MED ORDER — HYDROCODONE-ACETAMINOPHEN 5-325 MG PO TABS
ORAL_TABLET | ORAL | Status: AC
  Filled 2013-06-25: qty 2

## 2013-06-25 SURGICAL SUPPLY — 41 items
APPLIER CLIP LOGIC TI 5 (MISCELLANEOUS) IMPLANT
APPLIER CLIP ROT 10 11.4 M/L (STAPLE)
BANDAGE GAUZE ELAST BULKY 4 IN (GAUZE/BANDAGES/DRESSINGS) IMPLANT
CANISTER SUCTION 2500CC (MISCELLANEOUS) IMPLANT
CHLORAPREP W/TINT 26ML (MISCELLANEOUS) ×2 IMPLANT
CLIP APPLIE ROT 10 11.4 M/L (STAPLE) IMPLANT
CLOTH BEACON ORANGE TIMEOUT ST (SAFETY) ×2 IMPLANT
COVER SURGICAL LIGHT HANDLE (MISCELLANEOUS) ×2 IMPLANT
DECANTER SPIKE VIAL GLASS SM (MISCELLANEOUS) ×2 IMPLANT
DERMABOND ADVANCED (GAUZE/BANDAGES/DRESSINGS) ×1
DERMABOND ADVANCED .7 DNX12 (GAUZE/BANDAGES/DRESSINGS) ×1 IMPLANT
DEVICE SECURE STRAP 25 ABSORB (INSTRUMENTS) ×2 IMPLANT
DEVICE TROCAR PUNCTURE CLOSURE (ENDOMECHANICALS) ×2 IMPLANT
DRAPE INCISE IOBAN 66X45 STRL (DRAPES) ×2 IMPLANT
DRAPE UTILITY 15X26 W/TAPE STR (DRAPE) ×4 IMPLANT
ELECT CAUTERY BLADE 6.4 (BLADE) ×2 IMPLANT
ELECT REM PT RETURN 9FT ADLT (ELECTROSURGICAL) ×2
ELECTRODE REM PT RTRN 9FT ADLT (ELECTROSURGICAL) ×1 IMPLANT
GLOVE BIO SURGEON STRL SZ7.5 (GLOVE) ×2 IMPLANT
GOWN STRL NON-REIN LRG LVL3 (GOWN DISPOSABLE) ×6 IMPLANT
KIT BASIN OR (CUSTOM PROCEDURE TRAY) ×2 IMPLANT
KIT ROOM TURNOVER OR (KITS) ×2 IMPLANT
MARKER SKIN DUAL TIP RULER LAB (MISCELLANEOUS) ×2 IMPLANT
MESH VENTRALIGHT ST 4.5IN (Mesh General) ×2 IMPLANT
NEEDLE SPNL 22GX3.5 QUINCKE BK (NEEDLE) ×2 IMPLANT
NS IRRIG 1000ML POUR BTL (IV SOLUTION) ×2 IMPLANT
PAD ARMBOARD 7.5X6 YLW CONV (MISCELLANEOUS) ×2 IMPLANT
PENCIL BUTTON HOLSTER BLD 10FT (ELECTRODE) ×2 IMPLANT
SCALPEL HARMONIC ACE (MISCELLANEOUS) IMPLANT
SCISSORS LAP 5X35 DISP (ENDOMECHANICALS) IMPLANT
SET IRRIG TUBING LAPAROSCOPIC (IRRIGATION / IRRIGATOR) IMPLANT
SLEEVE ENDOPATH XCEL 5M (ENDOMECHANICALS) ×2 IMPLANT
SUT MNCRL AB 4-0 PS2 18 (SUTURE) ×2 IMPLANT
SUT NOVA NAB DX-16 0-1 5-0 T12 (SUTURE) ×2 IMPLANT
TOWEL OR 17X24 6PK STRL BLUE (TOWEL DISPOSABLE) ×2 IMPLANT
TOWEL OR 17X26 10 PK STRL BLUE (TOWEL DISPOSABLE) ×2 IMPLANT
TRAY FOLEY CATH 16FRSI W/METER (SET/KITS/TRAYS/PACK) ×2 IMPLANT
TRAY LAPAROSCOPIC (CUSTOM PROCEDURE TRAY) ×2 IMPLANT
TROCAR XCEL BLUNT TIP 100MML (ENDOMECHANICALS) IMPLANT
TROCAR XCEL NON-BLD 11X100MML (ENDOMECHANICALS) ×2 IMPLANT
TROCAR XCEL NON-BLD 5MMX100MML (ENDOMECHANICALS) ×2 IMPLANT

## 2013-06-25 NOTE — Interval H&P Note (Signed)
History and Physical Interval Note:  06/25/2013 7:21 AM  Deborah Whitehead  has presented today for surgery, with the diagnosis of ventral hernia  The various methods of treatment have been discussed with the patient and family. After consideration of risks, benefits and other options for treatment, the patient has consented to  Procedure(s): LAPAROSCOPIC VENTRAL HERNIA (N/A) INSERTION OF MESH (N/A) as a surgical intervention .  The patient's history has been reviewed, patient examined, no change in status, stable for surgery.  I have reviewed the patient's chart and labs.  Questions were answered to the patient's satisfaction.     TOTH III,PAUL S

## 2013-06-25 NOTE — Anesthesia Preprocedure Evaluation (Addendum)
Anesthesia Evaluation  Patient identified by MRN, date of birth, ID band Patient awake    Reviewed: Allergy & Precautions, H&P   History of Anesthesia Complications Negative for: history of anesthetic complications  Airway Mallampati: II      Dental  (+) Edentulous Upper and Edentulous Lower   Pulmonary shortness of breath and with exertion, Current Smoker,  breath sounds clear to auscultation        Cardiovascular hypertension, Pt. on medications Rhythm:Regular Rate:Normal     Neuro/Psych Anxiety Depression CVA (left side weaker than right), Residual Symptoms    GI/Hepatic Neg liver ROS, GERD-  Medicated and Controlled,  Endo/Other  negative endocrine ROS  Renal/GU negative Renal ROS     Musculoskeletal   Abdominal   Peds  Hematology   Anesthesia Other Findings   Reproductive/Obstetrics                         Anesthesia Physical Anesthesia Plan  ASA: III  Anesthesia Plan: General   Post-op Pain Management:    Induction: Intravenous  Airway Management Planned: Oral ETT  Additional Equipment:   Intra-op Plan:   Post-operative Plan: Extubation in OR  Informed Consent:   Dental advisory given  Plan Discussed with: CRNA, Anesthesiologist and Surgeon  Anesthesia Plan Comments:         Anesthesia Quick Evaluation

## 2013-06-25 NOTE — H&P (Signed)
TISHEENA MAGUIRE  05/13/2013 1:50 PM   Office Visit  MRN:  [id number]   Description: 62 year old Deborah Whitehead  Provider: Robyne Askew, MD  Department: [facility]        Diagnoses    Abdominal pain, unspecified site    -  Primary    789.00      Reason for Visit    New Evaluation    eval abd hernia        Current Vitals - Last Recorded    BP Pulse Temp(Src) Resp Ht Wt    130/80 80 98.6 F (37 C) (Temporal) 18 5\' 3"  (1.6 m) 221 lb 6.4 oz (100.426 kg)       BMI              39.23 kg/m2                 Progress Notes    Robyne Askew, MD at 05/13/2013  2:15 PM    Status: Signed                   Subjective:       Patient ID: Deborah Whitehead, Deborah Whitehead   DOB: [birth year], 62 y.o.   MRN: [id number]   HPI We are asked to see the patient in consultation by Dr. Pecola Leisure to evaluate her for an abdominal wall hernia. The patient is a 62 year old black Deborah Whitehead who states that she has had a bulge at the upper portion of her midline abdominal incision for about the last 5 years. During that time it does not seem to have gotten larger but she seems to be having more discomfort associated with it. She has had several episodes of nausea and vomiting. Her bowels are moving regularly and her appetite is good.   Review of Systems  Constitutional: Negative.   HENT: Negative.   Eyes: Negative.   Respiratory: Positive for cough.   Cardiovascular: Negative.   Gastrointestinal: Positive for abdominal pain.  Endocrine: Negative.   Genitourinary: Negative.   Musculoskeletal: Negative.   Skin: Negative.   Allergic/Immunologic: Negative.   Neurological: Negative.   Hematological: Negative.   Psychiatric/Behavioral: Negative.           Objective:     Physical Exam  Constitutional: She is oriented to person, place, and time. She appears well-developed and well-nourished.  HENT:   Head: Normocephalic and atraumatic.  Eyes: Conjunctivae and EOM are normal. Pupils are  equal, round, and reactive to light.  Neck: Normal range of motion. Neck supple.  Cardiovascular: Normal rate, regular rhythm and normal heart sounds.   Pulmonary/Chest: Effort normal and breath sounds normal.  Abdominal: Soft. Bowel sounds are normal.  There is some slight fullness at the upper portion of her midline incision but I cannot appreciate a palpable fascial defect or any reducible hernia  Musculoskeletal: Normal range of motion.  Neurological: She is alert and oriented to person, place, and time.  Skin: Skin is warm and dry.  Psychiatric: She has a normal mood and affect. Her behavior is normal.          Assessment:       The patient has some abdominal pain at the upper portion of her old incision but I cannot definitively appreciate a fascial defect consistent with a hernia. Because of this I think she should be evaluated with a CT scan of her abdomen and pelvis to look at her anatomy. If she does  have a hernia I have discussed with her the different options for repairing it including open versus laparoscopic. I think she would be a good laparoscopic candidate. I have discussed with her in detail the risks and benefits of the operation to fix the hernia laparoscopically as well as some of the technical aspects and she understands and wishes to proceed      Plan:       We will plan to obtain a CT scan of her abdomen and pelvis and then proceed accordingly. We will call her with the results of the study  CT showed ventral hernia. i think she is a good candidate for lap ventral hernia repair with mesh. I have discussed with her the risks and benefits of surgery as well as some of the technical aspects and she understands and wishes to proceed.

## 2013-06-25 NOTE — Progress Notes (Signed)
Orthopedic Tech Progress Note Patient Details:  Deborah Whitehead Sep 02, 1951 [id number] Abdominal binder ordered and delivered to PACU Ortho Devices Type of Ortho Device: Abdominal binder Ortho Device/Splint Interventions: Ordered   Asia R Thompson 06/25/2013, 10:26 AM

## 2013-06-25 NOTE — Anesthesia Postprocedure Evaluation (Signed)
  Anesthesia Post-op Note  Patient: Deborah Whitehead  Procedure(s) Performed: Procedure(s): LAPAROSCOPIC VENTRAL HERNIA (N/A) INSERTION OF MESH (N/A)  Patient Location: PACU  Anesthesia Type:General  Level of Consciousness: awake  Airway and Oxygen Therapy: Patient Spontanous Breathing  Post-op Pain: mild  Post-op Assessment: Post-op Vital signs reviewed  Post-op Vital Signs: Reviewed  Complications: No apparent anesthesia complications 

## 2013-06-25 NOTE — Anesthesia Procedure Notes (Signed)
Procedure Name: Intubation Date/Time: 06/25/2013 7:41 AM Performed by: Orvilla Fus A Pre-anesthesia Checklist: Patient identified, Timeout performed, Emergency Drugs available, Suction available and Patient being monitored Patient Re-evaluated:Patient Re-evaluated prior to inductionOxygen Delivery Method: Circle system utilized Preoxygenation: Pre-oxygenation with 100% oxygen Intubation Type: IV induction Ventilation: Mask ventilation without difficulty and Oral airway inserted - appropriate to patient size Laryngoscope Size: Mac and 4 Grade View: Grade II Tube type: Oral Tube size: 7.0 mm Number of attempts: 1 Airway Equipment and Method: Stylet Placement Confirmation: ETT inserted through vocal cords under direct vision,  breath sounds checked- equal and bilateral and positive ETCO2 Secured at: 21 cm Tube secured with: Tape Dental Injury: Teeth and Oropharynx as per pre-operative assessment

## 2013-06-25 NOTE — Addendum Note (Signed)
Addendum created 06/25/13 1016 by Judie Petit, MD   Modules edited: Anesthesia Attestations

## 2013-06-25 NOTE — Anesthesia Postprocedure Evaluation (Signed)
  Anesthesia Post-op Note  Patient: Deborah Whitehead  Procedure(s) Performed: Procedure(s): LAPAROSCOPIC VENTRAL HERNIA (N/A) INSERTION OF MESH (N/A)  Patient Location: PACU  Anesthesia Type:General  Level of Consciousness: awake  Airway and Oxygen Therapy: Patient Spontanous Breathing  Post-op Pain: mild  Post-op Assessment: Post-op Vital signs reviewed  Post-op Vital Signs: Reviewed  Complications: No apparent anesthesia complications

## 2013-06-25 NOTE — Transfer of Care (Signed)
Immediate Anesthesia Transfer of Care Note  Patient: Deborah Whitehead  Procedure(s) Performed: Procedure(s): LAPAROSCOPIC VENTRAL HERNIA (N/A) INSERTION OF MESH (N/A)  Patient Location: PACU  Anesthesia Type:General  Level of Consciousness: awake, alert  and oriented  Airway & Oxygen Therapy: Patient Spontanous Breathing and Patient connected to nasal cannula oxygen  Post-op Assessment: Report given to PACU RN, Post -op Vital signs reviewed and stable and Patient moving all extremities  Post vital signs: Reviewed and stable  Complications: No apparent anesthesia complications

## 2013-06-25 NOTE — Op Note (Signed)
06/25/2013  9:12 AM  PATIENT:  Deborah Whitehead  62 y.o. female  PRE-OPERATIVE DIAGNOSIS:  ventral hernia  POST-OPERATIVE DIAGNOSIS:  ventral hernia  PROCEDURE:  Procedure(s): LAPAROSCOPIC VENTRAL HERNIA (N/A) INSERTION OF MESH (N/A)  SURGEON:  Surgeon(s) and Role:    * Robyne Askew, MD - Primary  PHYSICIAN ASSISTANT:   ASSISTANTS: Dr. Luisa Hart   ANESTHESIA:   general  EBL:  Total I/O In: 700 [I.V.:700] Out: -   BLOOD ADMINISTERED:none  DRAINS: none   LOCAL MEDICATIONS USED:  MARCAINE     SPECIMEN:  No Specimen  DISPOSITION OF SPECIMEN:  N/A  COUNTS:  YES  TOURNIQUET:  * No tourniquets in log *  DICTATION: .Dragon Dictation After informed consent was obtained the patient was brought to the operating room and placed in the supine position on the operating room table. After adequate induction of general anesthesia the patient's abdomen was prepped with ChloraPrep, allowed to dry, and draped in usual sterile manner. A site was chosen in the left upper quadrant for placement of a Optiview port. This area was infiltrated with quarter percent Marcaine. A small stab incision was made with a 15 blade knife. A 5 mm Optiview port with the camera inserted was used to dissect bluntly through the layers of the abdominal wall under direct vision until access was gained to the abdominal cavity. The ends and insufflated with carbon dioxide without difficulty. The abdomen was inspected and there were some adhesions at the midline of the abdominal wall just above the umbilicus. There was an obvious hernia defect in this location containing only some omental fat. A second 5 mm port was placed on the left mid abdomen under direct vision. A harmonic scalpel was then used to take down the adhesions of fat to the abdominal wall so that the hernia defect could be easily seen. A harmonic scalpel was also used to take down some of the falciform ligament superiorly. Once this was accomplished the  abdominal wall around the hernia was free. The size of the fascial defect was estimated using a spinal needle and then an 11 cm piece of ventral light circular mesh was chosen. 4 #1 Novafil stitches were placed at equidistant points around the edge of the mesh. Next a small incision was made through the middle of the hernia with a 15 blade knife. The dissection was carried through the skin and subcutaneous tissue sharply with electrocautery until the abdominal cavity was entered. The mesh was placed through this opening into the abdominal cavity with the coated side oriented towards the intestines. The fascial defect at the hernia site was then closed with a figure-of-eight #1 Novafil stitch. The abdomen was then reinsufflated. 4 small stab incisions were made at points corresponding to the placement of the stitches. A suture passer was then used to bring the tails of each stitch through the appropriate opening in the skin. Once this was accomplished each of the stitches was tied down and the tails of the stitches removed. At this point the mesh was in good apposition to the abdominal wall. A secure strap tacking device was then used to tack the edges of the mesh between the stitches so that there were no gaps. Once this was accomplished the mesh was in good position. The superior Novafil stitch broke and was then replaced by passing a 0 Vicryl tie through the mesh at the same location. The Vicryl tie was then cinched and tied which created a good seal to  the abdominal wall. At this point the abdomen was generally inspected. There were no other abnormalities noted. There were some adhesions down in the pelvis but there were is no evidence of hernia on her previous CT scan in this location. At this point the gas was allowed to escape and the 5 mm ports were removed. The incisions were all closed with interrupted 4-0 Monocryl subcuticular stitches. Dermabond dressings were applied. The patient tolerated the procedure  well. At the end of the case needle sponge and instrument counts were correct. The patient was then awakened and taken to recovery in stable condition.  PLAN OF CARE: Admit to inpatient   PATIENT DISPOSITION:  PACU - hemodynamically stable.   Delay start of Pharmacological VTE agent (>24hrs) due to surgical blood loss or risk of bleeding: yes

## 2013-06-25 NOTE — Preoperative (Signed)
Beta Blockers   Reason not to administer Beta Blockers:Not Applicable 

## 2013-06-26 ENCOUNTER — Encounter (HOSPITAL_COMMUNITY): Payer: Self-pay | Admitting: General Surgery

## 2013-06-26 MED ORDER — ALUM & MAG HYDROXIDE-SIMETH 200-200-20 MG/5ML PO SUSP
30.0000 mL | ORAL | Status: DC | PRN
  Administered 2013-06-26 – 2013-06-27 (×3): 30 mL via ORAL
  Filled 2013-06-26 (×3): qty 30

## 2013-06-26 MED ORDER — PANTOPRAZOLE SODIUM 40 MG PO TBEC
40.0000 mg | DELAYED_RELEASE_TABLET | Freq: Every day | ORAL | Status: DC
  Administered 2013-06-26 – 2013-06-29 (×4): 40 mg via ORAL
  Filled 2013-06-26 (×4): qty 1

## 2013-06-26 MED ORDER — PNEUMOCOCCAL VAC POLYVALENT 25 MCG/0.5ML IJ INJ
0.5000 mL | INJECTION | INTRAMUSCULAR | Status: AC
  Filled 2013-06-26: qty 0.5

## 2013-06-26 NOTE — Progress Notes (Signed)
1 Day Post-Op  Subjective: Complains of pain and nausea  Objective: Vital signs in last 24 hours: Temp:  [97.4 F (36.3 C)-99.2 F (37.3 C)] 98.6 F (37 C) (07/11 1345) Pulse Rate:  [82-91] 91 (07/11 1345) Resp:  [18] 18 (07/11 1345) BP: (95-130)/(53-76) 118/76 mmHg (07/11 1345) SpO2:  [97 %-100 %] 100 % (07/11 1345) Last BM Date: 06/24/13  Intake/Output from previous day: 07/10 0701 - 07/11 0700 In: 700 [I.V.:700] Out: 850 [Urine:850] Intake/Output this shift:    GI: soft, appropriately tender. good bs  Lab Results:  No results found for this basename: WBC, HGB, HCT, PLT,  in the last 72 hours BMET No results found for this basename: NA, K, CL, CO2, GLUCOSE, BUN, CREATININE, CALCIUM,  in the last 72 hours PT/INR No results found for this basename: LABPROT, INR,  in the last 72 hours ABG No results found for this basename: PHART, PCO2, PO2, HCO3,  in the last 72 hours  Studies/Results: No results found.  Anti-infectives: Anti-infectives   Start     Dose/Rate Route Frequency Ordered Stop   06/25/13 0600  ceFAZolin (ANCEF) IVPB 2 g/50 mL premix     2 g 100 mL/hr over 30 Minutes Intravenous On call to O.R. 06/24/13 1358 06/25/13 0744      Assessment/Plan: s/p Procedure(s): LAPAROSCOPIC VENTRAL HERNIA (N/A) INSERTION OF MESH (N/A) Advance diet once nausea resolves Work on pain control OOB  LOS: 1 day    TOTH III,PAUL S 06/26/2013

## 2013-06-27 LAB — URINALYSIS, ROUTINE W REFLEX MICROSCOPIC
Bilirubin Urine: NEGATIVE
Glucose, UA: NEGATIVE mg/dL
Specific Gravity, Urine: 1.013 (ref 1.005–1.030)
pH: 6 (ref 5.0–8.0)

## 2013-06-27 LAB — CBC
HCT: 37.2 % (ref 36.0–46.0)
Hemoglobin: 12.2 g/dL (ref 12.0–15.0)
MCHC: 32.8 g/dL (ref 30.0–36.0)

## 2013-06-27 LAB — BASIC METABOLIC PANEL
BUN: 15 mg/dL (ref 6–23)
Chloride: 100 mEq/L (ref 96–112)
GFR calc non Af Amer: 51 mL/min — ABNORMAL LOW (ref >90)
Glucose, Bld: 123 mg/dL — ABNORMAL HIGH (ref 70–99)
Potassium: 4.3 mEq/L (ref 3.5–5.1)

## 2013-06-27 LAB — URINE MICROSCOPIC-ADD ON

## 2013-06-27 MED ORDER — HEPARIN SODIUM (PORCINE) 5000 UNIT/ML IJ SOLN
5000.0000 [IU] | Freq: Three times a day (TID) | INTRAMUSCULAR | Status: DC
  Administered 2013-06-27 – 2013-06-29 (×6): 5000 [IU] via SUBCUTANEOUS
  Filled 2013-06-27 (×9): qty 1

## 2013-06-27 MED ORDER — BISACODYL 10 MG RE SUPP
10.0000 mg | Freq: Every day | RECTAL | Status: DC | PRN
  Administered 2013-06-27: 10 mg via RECTAL
  Filled 2013-06-27: qty 1

## 2013-06-27 MED ORDER — SIMETHICONE 40 MG/0.6ML PO SUSP
40.0000 mg | Freq: Four times a day (QID) | ORAL | Status: DC | PRN
  Administered 2013-06-27: 40 mg via ORAL
  Filled 2013-06-27: qty 0.6

## 2013-06-27 NOTE — Progress Notes (Addendum)
<  principal problem not specified>  Assessment: Stable post op, little progress from yesterday  Plan: Will leave binder off for now as seemed to be causing pain. Will check labs, give suppository and try to ambulate more.   Subjective: Not had a bm or passed gas yet, c/o abdominal pain, particularly around incision in luq. Felt better when binder came off (was very tight and had slid down too low). Was up in chair, taking little po RN notes the urine somewhat foul smelling - will check u/a  Objective: Vital signs in last 24 hours: Temp:  [97.4 F (36.3 C)-99.1 F (37.3 C)] 98.5 F (36.9 C) (07/12 0525) Pulse Rate:  [74-91] 74 (07/12 0525) Resp:  [18-20] 20 (07/12 0525) BP: (95-128)/(53-87) 124/87 mmHg (07/12 0525) SpO2:  [97 %-100 %] 98 % (07/12 0525) Last BM Date: 06/24/13  Intake/Output from previous day: 07/11 0701 - 07/12 0700 In: 1760 [P.O.:480; I.V.:1280] Out: 1400 [Urine:1400]  General appearance: alert, cooperative and mild distress Resp: clear to auscultation bilaterally Cardio: regular rate and rhythm, S1, S2 normal, no murmur, click, rub or gallop Abd distended and a bit tight, some tender around incision, particularly one small one in LUQ. No BS yet Lab Results:  No results found for this or any previous visit (from the past 24 hour(s)).   Studies/Results Radiology     MEDS, Scheduled . aspirin EC  81 mg Oral Daily  . irbesartan  150 mg Oral Daily   And  . hydrochlorothiazide  25 mg Oral Daily  . pantoprazole  40 mg Oral Daily  . pneumococcal 23 valent vaccine  0.5 mL Intramuscular Tomorrow-1000       LOS: 2 days    Currie Paris, MD, [facility], [place] [phone]   06/27/2013 9:10 AM

## 2013-06-28 NOTE — Progress Notes (Signed)
3 Days Post-Op   Assessment: s/p Procedure(s): LAPAROSCOPIC VENTRAL HERNIA INSERTION OF MESH Patient Active Problem List   Diagnosis Date Noted  . Abdominal pain, unspecified site 05/13/2013    Improved today, not quite ready to go home  Plan: Plan for discharge tomorrow  Subjective: Feels better, pain improved since the binder was loostened, passed gas, no BM starting to take some PO, mote than yesterday  Objective: Vital signs in last 24 hours: Temp:  [98.4 F (36.9 C)-98.7 F (37.1 C)] 98.4 F (36.9 C) (07/13 0547) Pulse Rate:  [71-79] 79 (07/13 0547) Resp:  [15-18] 18 (07/13 0547) BP: (97-136)/(55-82) 97/78 mmHg (07/13 0547) SpO2:  [99 %-100 %] 99 % (07/13 0547)   Intake/Output from previous day:    General appearance: alert, cooperative and no distress Resp: clear to auscultation bilaterally GI: Still distended but less than yesterday and not tender today, BS +. Binder in place and not pulled as tight as yesterday, so more comfortable  Incision: healing well  Lab Results:   Recent Labs  06/27/13 1310  WBC 8.0  HGB 12.2  HCT 37.2  PLT 238   BMET  Recent Labs  06/27/13 1310  NA 137  K 4.3  CL 100  CO2 33*  GLUCOSE 123*  BUN 15  CREATININE 1.13*  CALCIUM 9.7    MEDS, Scheduled . aspirin EC  81 mg Oral Daily  . heparin subcutaneous  5,000 Units Subcutaneous Q8H  . irbesartan  150 mg Oral Daily   And  . hydrochlorothiazide  25 mg Oral Daily  . pantoprazole  40 mg Oral Daily  . pneumococcal 23 valent vaccine  0.5 mL Intramuscular Tomorrow-1000    Studies/Results: No results found.    LOS: 3 days     Currie Paris, MD, [facility], [place] [phone]   06/28/2013 9:51 AM

## 2013-06-29 NOTE — Progress Notes (Signed)
4 Days Post-Op  Subjective: She seems to be better. She is still having some pain but it is more manageable. She is having bm's  Objective: Vital signs in last 24 hours: Temp:  [96.8 F (36 C)-98.6 F (37 C)] 96.8 F (36 C) (07/14 0553) Pulse Rate:  [76-79] 79 (07/14 0553) Resp:  [18] 18 (07/14 0553) BP: (103-109)/(72-73) 103/72 mmHg (07/14 0553) SpO2:  [97 %] 97 % (07/14 0553) Last BM Date: 06/29/13  Intake/Output from previous day: 07/13 0701 - 07/14 0700 In: -  Out: 300 [Urine:300] Intake/Output this shift:    GI: soft, appropriately tender. incisions look good  Lab Results:   Recent Labs  06/27/13 1310  WBC 8.0  HGB 12.2  HCT 37.2  PLT 238   BMET  Recent Labs  06/27/13 1310  NA 137  K 4.3  CL 100  CO2 33*  GLUCOSE 123*  BUN 15  CREATININE 1.13*  CALCIUM 9.7   PT/INR No results found for this basename: LABPROT, INR,  in the last 72 hours ABG No results found for this basename: PHART, PCO2, PO2, HCO3,  in the last 72 hours  Studies/Results: No results found.  Anti-infectives: Anti-infectives   Start     Dose/Rate Route Frequency Ordered Stop   06/25/13 0600  ceFAZolin (ANCEF) IVPB 2 g/50 mL premix     2 g 100 mL/hr over 30 Minutes Intravenous On call to O.R. 06/24/13 1358 06/25/13 0744      Assessment/Plan: s/p Procedure(s): LAPAROSCOPIC VENTRAL HERNIA (N/A) INSERTION OF MESH (N/A) Discharge  LOS: 4 days    TOTH III,Deborah Whitehead S 06/29/2013

## 2013-06-29 NOTE — Evaluation (Signed)
Physical Therapy Evaluation Patient Details Name: Deborah Whitehead MRN: [id number] DOB: 29-Jun-1951 Today's Date: 06/29/2013 Time: 5784-6962 PT Time Calculation (min): 29 min  PT Assessment / Plan / Recommendation History of Present Illness  pt admitted for repair of ventral hernia, s/p laproscopic repair.  Prior to D/C pt concerned that her L LE has gotten weaker and more clumsy.  PT ordered.  Clinical Impression  Pt much more steady with the RW and with get one ordered for her.      PT Assessment  Patent does not need any further PT services    Follow Up Recommendations  No PT follow up;Supervision - Intermittent    Does the patient have the potential to tolerate intense rehabilitation      Barriers to Discharge        Equipment Recommendations  Rolling walker with 5" wheels    Recommendations for Other Services     Frequency      Precautions / Restrictions Precautions Precautions: Fall   Pertinent Vitals/Pain       Mobility  Bed Mobility Bed Mobility: Not assessed Transfers Transfers: Sit to Stand;Stand to Sit Sit to Stand: 5: Supervision Stand to Sit: 5: Supervision Details for Transfer Assistance: reinforced with vc/visual cues safe transfer technique Ambulation/Gait Ambulation/Gait Assistance: 4: Min assist;5: Supervision Ambulation Distance (Feet): 60 Feet (30 with RW, 30 without A device) Assistive device: Rolling walker;None Ambulation/Gait Assistance Details: Without device--unsteady with L foot drag, mild ataxia, shoes coming off.  With RW, mild HP gait remains, but much more stready and safe. Gait Pattern: Step-through pattern;Ataxic;Shuffle (L LE sragging behind at times) Stairs: Yes Stairs Assistance: 5: Supervision Stair Management Technique: One rail Left;Step to pattern;Sideways;Backwards Number of Stairs: 7    Exercises     PT Diagnosis:    PT Problem List:   PT Treatment Interventions:       PT Goals(Current goals can be found in  the care plan section)    Visit Information  Last PT Received On: 06/29/13 Assistance Needed: +1 History of Present Illness: pt admitted for repair of ventral hernia, s/p laproscopic repair.  Prior to D/C pt concerned that her L LE has gotten weaker and more clumsy.  PT ordered.       Prior Functioning  Home Living Family/patient expects to be discharged to:: Private residence Living Arrangements: Children (dtr who works days) Available Help at Discharge: Family;Available PRN/intermittently Type of Home: Apartment Home Access: Stairs to enter Entrance Stairs-Number of Steps: 10 Entrance Stairs-Rails: Right;Left Home Layout: One level Home Equipment: None Prior Function Level of Independence: Independent Communication Communication: No difficulties    Cognition  Cognition Arousal/Alertness: Awake/alert Behavior During Therapy: WFL for tasks assessed/performed Overall Cognitive Status: Within Functional Limits for tasks assessed    Extremity/Trunk Assessment Upper Extremity Assessment Upper Extremity Assessment: Overall WFL for tasks assessed Lower Extremity Assessment Lower Extremity Assessment: LLE deficits/detail LLE Deficits / Details: uncoordinated and mild ataxia with gait, numb per pt.   Balance Balance Balance Assessed: Yes Static Standing Balance Static Standing - Balance Support: Bilateral upper extremity supported Static Standing - Level of Assistance: 5: Stand by assistance  End of Session PT - End of Session Activity Tolerance: Patient tolerated treatment well Patient left: with call bell/phone within reach;with family/visitor present (sitting EOB)  GP     Sally Reimers, Eliseo Gum 06/29/2013, 4:13 PM 06/29/2013  [place] Bing, PT [phone]  (pager)

## 2013-06-29 NOTE — Care Management Note (Signed)
  Page 1 of 1   06/29/2013     3:51:39 PM   CARE MANAGEMENT NOTE 06/29/2013  Patient:  Deborah Whitehead, Deborah Whitehead   Account Number:  [id number]  Date Initiated:  06/29/2013  Documentation initiated by:  Ronny Flurry  Subjective/Objective Assessment:     Action/Plan:   Anticipated DC Date:     Anticipated DC Plan:  HOME/SELF CARE         Choice offered to / List presented to:     DME arranged  Levan Hurst      DME agency  [facility]        Status of service:  Completed, signed off Medicare Important Message given?   (If response is "NO", the following Medicare IM given date fields will be blank) Date Medicare IM given:   Date Additional Medicare IM given:    Discharge Disposition:    Per UR Regulation:    If discussed at Long Length of Stay Meetings, dates discussed:    Comments:

## 2013-06-29 NOTE — Discharge Summary (Signed)
Physician Discharge Summary  Patient ID: Deborah Whitehead MRN: [id number] DOB/AGE: 1951/01/23 62 y.o.  Admit date: 06/25/2013 Discharge date: 06/29/2013  Admission Diagnoses:  Discharge Diagnoses:  Active Problems:   * No active hospital problems. *   Discharged Condition: good  Hospital Course: the pt underwent lap ventral hernia repair with mesh. Postop she had issues with nausea and pain control. These gradually improved until she was ready for discharge home  Consults: None  Significant Diagnostic Studies: none  Treatments: surgery: as above  Discharge Exam: Blood pressure 103/72, pulse 79, temperature 96.8 F (36 C), temperature source Oral, resp. rate 18, height 5\' 3"  (1.6 m), weight 210 lb (95.255 kg), SpO2 97.00%. GI: soft, appropriately tender. incisions look good  Disposition: 01-Home or Self Care  Discharge Orders   Future Appointments Provider Department Dept Phone   07/14/2013 11:10 AM Robyne Askew, MD [facility] Surgery, [place] [phone]   Future Orders Complete By Expires     Call MD for:  difficulty breathing, headache or visual disturbances  As directed     Call MD for:  extreme fatigue  As directed     Call MD for:  hives  As directed     Call MD for:  persistant dizziness or light-headedness  As directed     Call MD for:  persistant nausea and vomiting  As directed     Call MD for:  redness, tenderness, or signs of infection (pain, swelling, redness, odor or green/yellow discharge around incision site)  As directed     Call MD for:  severe uncontrolled pain  As directed     Call MD for:  temperature >100.4  As directed     Diet - low sodium heart healthy  As directed     Discharge instructions  As directed     Comments:      May shower. Do not lift more than 5lbs for the next 6 weeks. May use abdominal binder if it is comfortable    Increase activity slowly  As directed     No wound care  As directed         Medication List         ALPRAZolam 0.25 MG tablet  Commonly known as:  XANAX  Take 1 tablet (0.25 mg total) by mouth 3 (three) times daily as needed for sleep or anxiety.     aspirin 81 MG tablet  Take 81 mg by mouth daily.     valsartan-hydrochlorothiazide 160-25 MG per tablet  Commonly known as:  DIOVAN-HCT  Take 1 tablet by mouth daily.           Follow-up Information   Follow up with Robyne Askew, MD In 2 weeks.   Contact information:   [address] [place] [address] [phone]       Signed: Robyne Askew 06/29/2013, 9:39 AM

## 2013-07-09 ENCOUNTER — Telehealth (INDEPENDENT_AMBULATORY_CARE_PROVIDER_SITE_OTHER): Payer: Self-pay

## 2013-07-09 ENCOUNTER — Other Ambulatory Visit (INDEPENDENT_AMBULATORY_CARE_PROVIDER_SITE_OTHER): Payer: Self-pay

## 2013-07-09 DIAGNOSIS — M7989 Other specified soft tissue disorders: Secondary | ICD-10-CM

## 2013-07-09 NOTE — Telephone Encounter (Signed)
Please order bilateral duplex US.  Most likely related to fluid balances, but need to rule out dvt.  Otherwise, she can call her PCP and see if her medications need to be adjusted.

## 2013-07-09 NOTE — Telephone Encounter (Signed)
Patient called to state she is still having swelling in her feet all the way up her leg. She says she had swelling in her feet/legs after her lap ventral hernia repair. At discharge she was told to keep legs elevated and to call our office if symptoms are no better. She report swelling is no better but and at times get worse. She has been taking epsom salt baths and keeping legs elevated all day. I told her Dr Carolynne Edouard is unavailable and we will ask our urgent office doctor and call her back.

## 2013-07-09 NOTE — Telephone Encounter (Signed)
Spoke with patient. She was made aware of below message. Bilateral dopplers set up for tomorrow at [facility] 07/10/13 @ 10:00 am to arrive at 9:45 am. Patient aware.

## 2013-07-10 ENCOUNTER — Encounter (HOSPITAL_BASED_OUTPATIENT_CLINIC_OR_DEPARTMENT_OTHER): Payer: Self-pay | Admitting: *Deleted

## 2013-07-10 ENCOUNTER — Emergency Department (HOSPITAL_BASED_OUTPATIENT_CLINIC_OR_DEPARTMENT_OTHER): Payer: Medicare Other

## 2013-07-10 ENCOUNTER — Ambulatory Visit (HOSPITAL_COMMUNITY)
Admission: RE | Admit: 2013-07-10 | Discharge: 2013-07-10 | Disposition: A | Discharge status: Home / Self Care | Payer: Medicare Other | Source: Ambulatory Visit | Attending: General Surgery | Admitting: General Surgery

## 2013-07-10 ENCOUNTER — Inpatient Hospital Stay (HOSPITAL_BASED_OUTPATIENT_CLINIC_OR_DEPARTMENT_OTHER)
Admission: EM | Admit: 2013-07-10 | Discharge: 2013-07-14 | DRG: 065 | Disposition: A | Discharge status: Rehabilitation Facility | Payer: Medicare Other | Attending: Internal Medicine | Admitting: Internal Medicine

## 2013-07-10 DIAGNOSIS — G8190 Hemiplegia, unspecified affecting unspecified side: Secondary | ICD-10-CM | POA: Diagnosis present

## 2013-07-10 DIAGNOSIS — Z79899 Other long term (current) drug therapy: Secondary | ICD-10-CM

## 2013-07-10 DIAGNOSIS — M129 Arthropathy, unspecified: Secondary | ICD-10-CM | POA: Diagnosis present

## 2013-07-10 DIAGNOSIS — F17200 Nicotine dependence, unspecified, uncomplicated: Secondary | ICD-10-CM | POA: Diagnosis present

## 2013-07-10 DIAGNOSIS — F411 Generalized anxiety disorder: Secondary | ICD-10-CM | POA: Diagnosis present

## 2013-07-10 DIAGNOSIS — K219 Gastro-esophageal reflux disease without esophagitis: Secondary | ICD-10-CM | POA: Diagnosis present

## 2013-07-10 DIAGNOSIS — R109 Unspecified abdominal pain: Secondary | ICD-10-CM | POA: Diagnosis present

## 2013-07-10 DIAGNOSIS — Z8673 Personal history of transient ischemic attack (TIA), and cerebral infarction without residual deficits: Secondary | ICD-10-CM

## 2013-07-10 DIAGNOSIS — I10 Essential (primary) hypertension: Secondary | ICD-10-CM | POA: Diagnosis present

## 2013-07-10 DIAGNOSIS — E669 Obesity, unspecified: Secondary | ICD-10-CM | POA: Diagnosis present

## 2013-07-10 DIAGNOSIS — E785 Hyperlipidemia, unspecified: Secondary | ICD-10-CM | POA: Diagnosis present

## 2013-07-10 DIAGNOSIS — Z7982 Long term (current) use of aspirin: Secondary | ICD-10-CM

## 2013-07-10 DIAGNOSIS — F329 Major depressive disorder, single episode, unspecified: Secondary | ICD-10-CM | POA: Diagnosis present

## 2013-07-10 DIAGNOSIS — F17210 Nicotine dependence, cigarettes, uncomplicated: Secondary | ICD-10-CM | POA: Diagnosis present

## 2013-07-10 DIAGNOSIS — Z23 Encounter for immunization: Secondary | ICD-10-CM

## 2013-07-10 DIAGNOSIS — I6350 Cerebral infarction due to unspecified occlusion or stenosis of unspecified cerebral artery: Principal | ICD-10-CM | POA: Diagnosis present

## 2013-07-10 DIAGNOSIS — Z7902 Long term (current) use of antithrombotics/antiplatelets: Secondary | ICD-10-CM

## 2013-07-10 DIAGNOSIS — F3289 Other specified depressive episodes: Secondary | ICD-10-CM | POA: Diagnosis present

## 2013-07-10 DIAGNOSIS — I639 Cerebral infarction, unspecified: Secondary | ICD-10-CM | POA: Diagnosis present

## 2013-07-10 LAB — CBC
MCH: 29.9 pg (ref 26.0–34.0)
MCV: 93.7 fL (ref 78.0–100.0)
Platelets: 279 10*3/uL (ref 150–400)
RBC: 3.95 MIL/uL (ref 3.87–5.11)

## 2013-07-10 LAB — URINALYSIS, ROUTINE W REFLEX MICROSCOPIC
Bilirubin Urine: NEGATIVE
Glucose, UA: NEGATIVE mg/dL
Hgb urine dipstick: NEGATIVE
Ketones, ur: NEGATIVE mg/dL
Nitrite: NEGATIVE
Protein, ur: NEGATIVE mg/dL
Specific Gravity, Urine: 1.02 (ref 1.005–1.030)
Urobilinogen, UA: 1 mg/dL (ref 0.0–1.0)
pH: 7.5 (ref 5.0–8.0)

## 2013-07-10 LAB — RAPID URINE DRUG SCREEN, HOSP PERFORMED
Amphetamines: NOT DETECTED
Barbiturates: NOT DETECTED
Benzodiazepines: NOT DETECTED
Cocaine: NOT DETECTED
Opiates: NOT DETECTED
Tetrahydrocannabinol: NOT DETECTED

## 2013-07-10 LAB — TROPONIN I: Troponin I: <0.3 ng/mL (ref <0.30)

## 2013-07-10 LAB — DIFFERENTIAL
Basophils Absolute: 0 10*3/uL (ref 0.0–0.1)
Basophils Relative: 0 % (ref 0–1)
Eosinophils Absolute: 0.3 10*3/uL (ref 0.0–0.7)
Eosinophils Relative: 5 % (ref 0–5)
Lymphocytes Relative: 31 % (ref 12–46)
Lymphs Abs: 2.1 10*3/uL (ref 0.7–4.0)
Monocytes Absolute: 0.7 10*3/uL (ref 0.1–1.0)
Monocytes Relative: 10 % (ref 3–12)
Neutro Abs: 3.7 10*3/uL (ref 1.7–7.7)
Neutrophils Relative %: 55 % (ref 43–77)

## 2013-07-10 LAB — PROTIME-INR
INR: 1.06 (ref 0.00–1.49)
Prothrombin Time: 13.6 s (ref 11.6–15.2)

## 2013-07-10 LAB — COMPREHENSIVE METABOLIC PANEL
ALT: 18 U/L (ref 0–35)
BUN: 16 mg/dL (ref 6–23)
Calcium: 10 mg/dL (ref 8.4–10.5)
GFR calc Af Amer: 61 mL/min — ABNORMAL LOW (ref >90)
Glucose, Bld: 75 mg/dL (ref 70–99)
Sodium: 137 mEq/L (ref 135–145)
Total Protein: 7.9 g/dL (ref 6.0–8.3)

## 2013-07-10 LAB — ETHANOL: Alcohol, Ethyl (B): <11 mg/dL (ref 0–11)

## 2013-07-10 LAB — GLUCOSE, CAPILLARY: Glucose-Capillary: 74 mg/dL (ref 70–99)

## 2013-07-10 MED ORDER — LORATADINE 10 MG PO TABS
10.0000 mg | ORAL_TABLET | Freq: Every day | ORAL | Status: DC | PRN
  Filled 2013-07-10: qty 1

## 2013-07-10 MED ORDER — KETOROLAC TROMETHAMINE 0.5 % OP SOLN
1.0000 [drp] | Freq: Four times a day (QID) | OPHTHALMIC | Status: DC
  Administered 2013-07-11 – 2013-07-14 (×8): 1 [drp] via OPHTHALMIC
  Filled 2013-07-10: qty 3

## 2013-07-10 MED ORDER — VALSARTAN-HYDROCHLOROTHIAZIDE 160-25 MG PO TABS: 1.0000 | ORAL_TABLET | Freq: Every day | ORAL | Status: DC

## 2013-07-10 MED ORDER — HYDROCHLOROTHIAZIDE 25 MG PO TABS
25.0000 mg | ORAL_TABLET | Freq: Every day | ORAL | Status: DC
  Administered 2013-07-11 – 2013-07-12 (×2): 25 mg via ORAL
  Filled 2013-07-10 (×2): qty 1

## 2013-07-10 MED ORDER — ASPIRIN EC 81 MG PO TBEC
81.0000 mg | DELAYED_RELEASE_TABLET | Freq: Every day | ORAL | Status: DC
  Administered 2013-07-11 – 2013-07-12 (×2): 81 mg via ORAL
  Filled 2013-07-10 (×2): qty 1

## 2013-07-10 MED ORDER — ALPRAZOLAM 0.25 MG PO TABS: 0.2500 mg | ORAL_TABLET | Freq: Three times a day (TID) | ORAL | Status: DC | PRN

## 2013-07-10 MED ORDER — SODIUM CHLORIDE 0.9 % IJ SOLN
3.0000 mL | Freq: Two times a day (BID) | INTRAMUSCULAR | Status: DC
  Administered 2013-07-10 – 2013-07-11 (×3): 3 mL via INTRAVENOUS

## 2013-07-10 MED ORDER — ASPIRIN 81 MG PO TABS: 81.0000 mg | ORAL_TABLET | Freq: Every day | ORAL | Status: DC

## 2013-07-10 MED ORDER — SODIUM CHLORIDE 0.9 % IV SOLN: INTRAVENOUS | Status: DC

## 2013-07-10 MED ORDER — GATIFLOXACIN 0.5 % OP SOLN
1.0000 [drp] | Freq: Four times a day (QID) | OPHTHALMIC | Status: DC
  Administered 2013-07-11 – 2013-07-14 (×8): 1 [drp] via OPHTHALMIC
  Filled 2013-07-10: qty 2.5

## 2013-07-10 MED ORDER — PREDNISOLONE ACETATE 1 % OP SUSP
1.0000 [drp] | Freq: Four times a day (QID) | OPHTHALMIC | Status: DC
  Administered 2013-07-11 – 2013-07-14 (×8): 1 [drp] via OPHTHALMIC
  Filled 2013-07-10: qty 1

## 2013-07-10 MED ORDER — KETOROLAC TROMETHAMINE 0.4 % OP SOLN: 1.0000 [drp] | Freq: Four times a day (QID) | OPHTHALMIC | Status: DC

## 2013-07-10 MED ORDER — IRBESARTAN 150 MG PO TABS
150.0000 mg | ORAL_TABLET | Freq: Every day | ORAL | Status: DC
  Administered 2013-07-11 – 2013-07-14 (×3): 150 mg via ORAL
  Filled 2013-07-10 (×4): qty 1

## 2013-07-10 NOTE — ED Notes (Signed)
Pt remains in radiology having dopplers performed, checked with glen regarding pt status, states pt is still in ultrasound.

## 2013-07-10 NOTE — ED Notes (Signed)
Pt returns from ultrasound

## 2013-07-10 NOTE — ED Notes (Signed)
Pt c/o left side numbness x 2 weeks and left leg swelling

## 2013-07-10 NOTE — Progress Notes (Signed)
62 year old female with history of stroke, hypertension, anxiety presented to ED at [facility] with two-week history of left facial numbness, left arm numbness and weakness, and left leg weakness and numbness. The patient had a laparoscopic ventral hernia repair on 06/27/2013 by Dr. Carolynne Edouard. Patient stated that she felt left arm weakness and left leg weakness at that time postoperatively. Her symptoms have not worsened. There is no syncope. The patient's vitals are stable. CT of the brain was negative for acute stroke or bleed. [facility] has no MR capability. Patient was accepted for transfer to [facility] for stroke workup.  DTat

## 2013-07-10 NOTE — ED Notes (Signed)
Pt not in room, radiology phoned, states pt is having her dopplers performed at this time.

## 2013-07-10 NOTE — ED Provider Notes (Signed)
CSN: [id number]     Arrival date & time 07/10/13  1433 History     First MD Initiated Contact with Patient 07/10/13 1518     Chief Complaint  Patient presents with  . Numbness   (Consider location/radiation/quality/duration/timing/severity/associated sxs/prior Treatment) HPI This 62 year old female has a history of prior stroke and hypertension a ventral hernia repair 2 weeks ago and states since that surgery she has had mild weakness and numbness to her left face arm and leg which is improving but not back to baseline yet, she also has had postoperative swelling of both legs with the left leg more swollen than the right, she has some left thigh pain but no change to the skin on the left leg, she is no change in speech vision swallowing or understanding no chest pain palpitation shortness breath, she is abdominal pain slowly improving since her surgery as expected, she is no vomiting, she is voiding and having bowel movements, prior to surgery she was walking unassisted, since surgery she had to use a walker to left leg weakness but is no longer having use the walker although her left leg is still weak and numb, her left face and left arm are minimally numb now and minimally weak now in her left leg is mildly numbness and weakness is mild to moderate now. The patient does not a code stroke candidate since her symptoms have been present since surgery 2 weeks ago.  Past Medical History  Diagnosis Date  . Anxiety   . Arthritis   . Substance abuse   . Hypertension     dr Pecola Leisure     immanuel fp  . Shortness of breath   . GERD (gastroesophageal reflux disease)   . Depression   . Fibroids     abd  . Stroke     memory loss   Past Surgical History  Procedure Laterality Date  . Cesarean section    . Cholecystectomy    . Abdominal hysterectomy    . Ventral hernia repair N/A 06/25/2013    Procedure: LAPAROSCOPIC VENTRAL HERNIA;  Surgeon: Robyne Askew, MD;  Location: [facility] OR;  Service: General;   Laterality: N/A;  . Insertion of mesh N/A 06/25/2013    Procedure: INSERTION OF MESH;  Surgeon: Robyne Askew, MD;  Location: [facility] OR;  Service: General;  Laterality: N/A;   Family History  Problem Relation Age of Onset  . Cancer Mother     Breast  . Cancer Father     Prostate   History  Substance Use Topics  . Smoking status: Current Every Day Smoker -- 0.25 packs/day for 15 years  . Smokeless tobacco: Never Used  . Alcohol Use: No     Comment: stopped   OB History   Grav Para Term Preterm Abortions TAB SAB Ect Mult Living                 Review of Systems 10 Systems reviewed and are negative for acute change except as noted in the HPI. Allergies  Review of patient's allergies indicates no known allergies.  Home Medications   No current outpatient prescriptions on file. BP 148/83  Pulse 79  Temp(Src) 98.2 F (36.8 C) (Oral)  Resp 18  Ht 5\' 3"  (1.6 m)  Wt 222 lb 7.1 oz (100.9 kg)  BMI 39.41 kg/m2  SpO2 97% Physical Exam  Nursing note and vitals reviewed. Constitutional:  Awake, alert, nontoxic appearance with baseline speech for patient.  HENT:  Head: Atraumatic.  Mouth/Throat: No oropharyngeal exudate.  Eyes: EOM are normal. Pupils are equal, round, and reactive to light. Right eye exhibits no discharge. Left eye exhibits no discharge.  Neck: Neck supple.  Cardiovascular: Normal rate and regular rhythm.   No murmur heard. Pulmonary/Chest: Effort normal and breath sounds normal. No stridor. No respiratory distress. She has no wheezes. She has no rales. She exhibits no tenderness.  Abdominal: Soft. Bowel sounds are normal. She exhibits no mass. There is tenderness. There is no rebound.  Mild diffuse tenderness without rebound, incisions are clean without erythema purulence or dehiscence  Musculoskeletal: She exhibits edema and tenderness.  Baseline ROM, moves extremities with mild new left-sided focal weakness. Minimal left thigh tenderness. Trace edema right foot  and lower leg, mild edema left foot and lower leg.  Lymphadenopathy:    She has no cervical adenopathy.  Neurological: She is alert.  Awake, alert, cooperative and aware of situation; motor strength 5/5 right side and 4+/5 left arm and 4/5 left leg; sensation normal to light touch right side and slight numbness left face/arm/leg; peripheral visual fields full to confrontation; slight left lower facial droop facial asymmetry; tongue midline; otherwise major cranial nerves appear intact; slight left arm pronator drift, normal finger to nose bilaterally  Skin: No rash noted.  Psychiatric: She has a normal mood and affect.    ED Course   Procedures (including critical care time) ECG: Normal sinus rhythm, ventricular rate 82, normal axis, normal intervals, no acute ischemic changes noted, no significant change noted compared with March 2014 Patient / Family / Caregiver informed of clinical course, understand medical decision-making process, and agree with plan. d/w [facility] for transfer.No MRI available at [facility] and suspect subacute CVA. Labs Reviewed  CBC - Abnormal; Notable for the following:    Hemoglobin 11.8 (*)    All other components within normal limits  COMPREHENSIVE METABOLIC PANEL - Abnormal; Notable for the following:    Albumin 3.3 (*)    Alkaline Phosphatase 120 (*)    Total Bilirubin 0.2 (*)    GFR calc non Af Amer 53 (*)    GFR calc Af Amer 61 (*)    All other components within normal limits  URINALYSIS, ROUTINE W REFLEX MICROSCOPIC - Abnormal; Notable for the following:    APPearance CLOUDY (*)    Leukocytes, UA MODERATE (*)    All other components within normal limits  URINE MICROSCOPIC-ADD ON - Abnormal; Notable for the following:    Squamous Epithelial / LPF FEW (*)    Bacteria, UA FEW (*)    All other components within normal limits  URINE CULTURE  ETHANOL  PROTIME-INR  APTT  DIFFERENTIAL  URINE RAPID DRUG SCREEN (HOSP PERFORMED)  TROPONIN I  GLUCOSE, CAPILLARY    Ct Head Wo Contrast  07/10/2013   *RADIOLOGY REPORT*  Clinical Data: Left-sided numbness.  CT HEAD WITHOUT CONTRAST  Technique:  Contiguous axial images were obtained from the base of the skull through the vertex without contrast.  Comparison: CT scan dated 01/27/2010  Findings: There is no acute intracranial hemorrhage, infarction, or mass lesion.  There are scattered periventricular white matter lucencies consistent with chronic small vessel ischemic disease, stable. Several old small lacunar infarcts including the right occipital lobe.  Diffuse mild cerebral cortical atrophy.  Stable benign calcification on the left side of the torn.  No osseous abnormality.  IMPRESSION: No acute intracranial abnormality.  Chronic small vessel ischemic changes.  Several old lacunar infarcts including the right  occipital lobe.   Original Report Authenticated By: Francene Boyers, M.D.   Mr [facility] Wo Contrast  07/11/2013   *RADIOLOGY REPORT*  Clinical Data:  Left leg weakness and left upper extremity numbness.  MRI HEAD WITHOUT CONTRAST MRA HEAD WITHOUT CONTRAST  Technique:  Multiplanar, multiecho pulse sequences of the brain and surrounding structures were obtained without intravenous contrast. Angiographic images of the head were obtained using MRA technique without contrast.  Comparison:  Head CT 07/10/2013  MRI HEAD  Findings:  There are numerous punctate foci of acute /subacute infarction.  There is a punctate focus in the right thalamus. There is a punctate focus in the right medial parietal occipital parietal junction cortex.  There are numerous punctate foci in the right parietal region along the margin of the right anterior and middle cerebral artery junction.  No large confluent infarction. No swelling or hemorrhage.  There are chronic small vessel changes affecting the pons.  There are a few old small vessel cerebellar infarctions.  There are a few old small vessel infarctions in the thalami and there is moderate  chronic small vessel disease affecting the cerebral hemispheric deep and subcortical white matter.  There is old infarction in the right basal ganglia/external capsule region.  No mass lesion, hemorrhage, hydrocephalus or extra-axial collection.  No pituitary mass.  No inflammatory sinus disease.  No skull or skull base lesion.  IMPRESSION: Numerous punctate foci of acute infarction in the right hemisphere as outlined above.  This includes involvement of the right thalamus, occipital parietal junction region, and parietal lobe along the junction of the anterior and middle cerebral territories. Findings could be due to watershed infarction or micro emboli.  No mass effect or hemorrhage.  Moderate chronic small vessel ischemic changes elsewhere throughout the brain as outlined above.  MRA HEAD  Findings: Both internal carotid arteries are patent into the brain. There is narrowing and irregularity in both carotid siphon regions, right more than left.  Narrowing of the right carotid siphon region is estimated at 50-70%.  There is flow within both anterior and middle cerebral arteries, but the vessels show narrowing and irregularity throughout consistent with diffuse intracranial atherosclerotic disease.  Both vertebral arteries are patent to the basilar with the left being dominant.  No basilar stenosis.  There is irregularity of the basilar artery consistent with atherosclerotic disease.  Superior cerebellar and posterior cerebral arteries are patent, but show considerable atherosclerotic narrowing with multiple stenoses.  IMPRESSION: No major vessel occlusion.  Severe widespread medium to small vessel atherosclerotic disease throughout all the major intracranial vascular territories with multiple areas of narrowing and irregularity.   Original Report Authenticated By: Paulina Fusi, M.D.   Mr Brain Wo Contrast  07/11/2013   *RADIOLOGY REPORT*  Clinical Data:  Left leg weakness and left upper extremity numbness.  MRI  HEAD WITHOUT CONTRAST MRA HEAD WITHOUT CONTRAST  Technique:  Multiplanar, multiecho pulse sequences of the brain and surrounding structures were obtained without intravenous contrast. Angiographic images of the head were obtained using MRA technique without contrast.  Comparison:  Head CT 07/10/2013  MRI HEAD  Findings:  There are numerous punctate foci of acute /subacute infarction.  There is a punctate focus in the right thalamus. There is a punctate focus in the right medial parietal occipital parietal junction cortex.  There are numerous punctate foci in the right parietal region along the margin of the right anterior and middle cerebral artery junction.  No large confluent infarction. No swelling or hemorrhage.  There are chronic small vessel changes affecting the pons.  There are a few old small vessel cerebellar infarctions.  There are a few old small vessel infarctions in the thalami and there is moderate chronic small vessel disease affecting the cerebral hemispheric deep and subcortical white matter.  There is old infarction in the right basal ganglia/external capsule region.  No mass lesion, hemorrhage, hydrocephalus or extra-axial collection.  No pituitary mass.  No inflammatory sinus disease.  No skull or skull base lesion.  IMPRESSION: Numerous punctate foci of acute infarction in the right hemisphere as outlined above.  This includes involvement of the right thalamus, occipital parietal junction region, and parietal lobe along the junction of the anterior and middle cerebral territories. Findings could be due to watershed infarction or micro emboli.  No mass effect or hemorrhage.  Moderate chronic small vessel ischemic changes elsewhere throughout the brain as outlined above.  MRA HEAD  Findings: Both internal carotid arteries are patent into the brain. There is narrowing and irregularity in both carotid siphon regions, right more than left.  Narrowing of the right carotid siphon region is estimated  at 50-70%.  There is flow within both anterior and middle cerebral arteries, but the vessels show narrowing and irregularity throughout consistent with diffuse intracranial atherosclerotic disease.  Both vertebral arteries are patent to the basilar with the left being dominant.  No basilar stenosis.  There is irregularity of the basilar artery consistent with atherosclerotic disease.  Superior cerebellar and posterior cerebral arteries are patent, but show considerable atherosclerotic narrowing with multiple stenoses.  IMPRESSION: No major vessel occlusion.  Severe widespread medium to small vessel atherosclerotic disease throughout all the major intracranial vascular territories with multiple areas of narrowing and irregularity.   Original Report Authenticated By: Paulina Fusi, M.D.   US Venous Img Lower Bilateral  07/10/2013   *RADIOLOGY REPORT*  Clinical Data: Bilateral lower leg swelling since ventral hernia repair.  Question DVT.  BILATERAL LOWER EXTREMITY VENOUS DUPLEX ULTRASOUND  Technique:  Gray-scale sonography with graded compression, as well as color Doppler and duplex ultrasound, were performed to evaluate the deep venous system of both lower extremities from the level of the common femoral vein through the popliteal and proximal calf veins.  Spectral Doppler was utilized to evaluate flow at rest and with distal augmentation maneuvers.  Comparison:  Pelvic CT 05/15/2013.  Findings:  Normal compressibility of bilateral common femoral, superficial femoral, and popliteal veins is demonstrated, as well as the visualized proximal calf veins.  Examination is somewhat limited by body habitus.  No filling defects to suggest DVT on grayscale or color Doppler imaging.  Doppler waveforms show normal direction of venous flow, normal respiratory phasicity and response to augmentation.  IMPRESSION: No evidence of deep vein thrombosis in either lower extremity. Examination is somewhat limited by body habitus.    Original Report Authenticated By: Carey Bullocks, M.D.   1. Stroke   2. Abdominal pain, unspecified site   3. HTN (hypertension)     MDM  The patient appears reasonably stabilized for transfer considering the current resources, flow, and capabilities available in the ED at this time, and I doubt any other [facility] requiring further screening and/or treatment in the ED prior to transfer.  Hurman Horn, MD 07/11/13 1310

## 2013-07-10 NOTE — ED Notes (Signed)
Pt reports having laparoscopic ventral hernia repair x2 weeks ago, immediately following the surgery pt was experiencing left LE weakness, pt states she needed a walker on d/c from the surgery d/t weakness. Over the las two weeks pt has noted increased left face/arm/leg numbness/tingling - pt w/ tactile sensation intact however states it is more dull of a sensation than her rt side. Pt w/o any facial drooping, slurred speech, or memory impairment. Pt is A&Ox4 in no acute distress, moves all extremities.

## 2013-07-11 ENCOUNTER — Observation Stay (HOSPITAL_COMMUNITY): Payer: Medicare Other

## 2013-07-11 LAB — URINE CULTURE

## 2013-07-11 MED ORDER — LORAZEPAM 2 MG/ML IJ SOLN: 1.0000 mg | Freq: Once | INTRAMUSCULAR | Status: DC

## 2013-07-11 MED ORDER — LORAZEPAM 2 MG/ML IJ SOLN
1.0000 mg | Freq: Once | INTRAMUSCULAR | Status: AC
  Administered 2013-07-11: 1 mg via INTRAVENOUS
  Filled 2013-07-11: qty 1

## 2013-07-11 MED ORDER — LORAZEPAM 2 MG/ML IJ SOLN
INTRAMUSCULAR | Status: AC
  Filled 2013-07-11: qty 1

## 2013-07-11 MED ORDER — PNEUMOCOCCAL VAC POLYVALENT 25 MCG/0.5ML IJ INJ
0.5000 mL | INJECTION | INTRAMUSCULAR | Status: AC
  Filled 2013-07-11: qty 0.5

## 2013-07-11 NOTE — Progress Notes (Signed)
[facility] PROGRESS NOTE  Deborah Whitehead YNW:[id number] DOB: [birth year] DOA: 07/10/2013 PCP: Karie Chimera, MD  Assessment/Plan: 1. CVA- MRI +; MRA done, echo, carotids ordered, ASA, PT/OT, HgbA1C, FLP 2. HTN- controlled 3. S/p hernia repair  Code Status: full Family Communication: patient Disposition Plan:    Consultants:    Procedures:  Echo  MRI/MRA  carotids  Antibiotics:    HPI/Subjective: Required ativan to get MRI- sleepy now  Objective: Filed Vitals:   07/10/13 2109 07/11/13 0202 07/11/13 0602 07/11/13 0948  BP: 126/94 148/78 130/71 148/83  Pulse: 98 72 77 79  Temp: 97.8 F (36.6 C) 98 F (36.7 C) 97.8 F (36.6 C) 98.2 F (36.8 C)  TempSrc: Oral Oral Oral Oral  Resp: 20 22 20 18   Height: 5\' 3"  (1.6 m)     Weight: 100.9 kg (222 lb 7.1 oz)     SpO2: 100% 99% 97% 97%    Intake/Output Summary (Last 24 hours) at 07/11/13 1117 Last data filed at 07/11/13 0826  Gross per 24 hour  Intake    240 ml  Output      0 ml  Net    240 ml   Filed Weights   07/10/13 1440 07/10/13 2109  Weight: 97.07 kg (214 lb) 100.9 kg (222 lb 7.1 oz)    Exam:  GEN: A+Ox3, NAD  CHEST: Normal respiration, clear to auscultation bilaterally.  HEART: Regular rate and rhythm. There are no murmur, rub, or gallops.  ABDOMEN: soft and non-tender; no masses, no organomegaly, normal abdominal bowel sounds; no pannus; no intertriginous candida. There is no rebound and no distention.  EXTREMITIES: No bone or joint deformity; age-appropriate arthropathy of the hands and knees; no edema; no ulcerations. There is no calf tenderness.     Data Reviewed: Basic Metabolic Panel:  Recent Labs Lab 07/10/13 1621  NA 137  K 3.9  CL 98  CO2 31  GLUCOSE 75  BUN 16  CREATININE 1.10  CALCIUM 10.0   Liver Function Tests:  Recent Labs Lab 07/10/13 1621  AST 23  ALT 18  ALKPHOS 120*  BILITOT 0.2*  PROT 7.9  ALBUMIN 3.3*   No results found for this basename:  LIPASE, AMYLASE,  in the last 168 hours No results found for this basename: AMMONIA,  in the last 168 hours CBC:  Recent Labs Lab 07/10/13 1621  WBC 6.8  NEUTROABS 3.7  HGB 11.8*  HCT 37.0  MCV 93.7  PLT 279   Cardiac Enzymes:  Recent Labs Lab 07/10/13 1621  TROPONINI <0.30   BNP (last 3 results) No results found for this basename: PROBNP,  in the last 8760 hours CBG:  Recent Labs Lab 07/10/13 1557  GLUCAP 74    No results found for this or any previous visit (from the past 240 hour(s)).   Studies: Ct Head Wo Contrast  07/10/2013   *RADIOLOGY REPORT*  Clinical Data: Left-sided numbness.  CT HEAD WITHOUT CONTRAST  Technique:  Contiguous axial images were obtained from the base of the skull through the vertex without contrast.  Comparison: CT scan dated 01/27/2010  Findings: There is no acute intracranial hemorrhage, infarction, or mass lesion.  There are scattered periventricular white matter lucencies consistent with chronic small vessel ischemic disease, stable. Several old small lacunar infarcts including the right occipital lobe.  Diffuse mild cerebral cortical atrophy.  Stable benign calcification on the left side of the torn.  No osseous abnormality.  IMPRESSION: No acute intracranial abnormality.  Chronic small vessel  ischemic changes.  Several old lacunar infarcts including the right occipital lobe.   Original Report Authenticated By: Francene Boyers, M.D.   US Venous Img Lower Bilateral  07/10/2013   *RADIOLOGY REPORT*  Clinical Data: Bilateral lower leg swelling since ventral hernia repair.  Question DVT.  BILATERAL LOWER EXTREMITY VENOUS DUPLEX ULTRASOUND  Technique:  Gray-scale sonography with graded compression, as well as color Doppler and duplex ultrasound, were performed to evaluate the deep venous system of both lower extremities from the level of the common femoral vein through the popliteal and proximal calf veins.  Spectral Doppler was utilized to evaluate flow  at rest and with distal augmentation maneuvers.  Comparison:  Pelvic CT 05/15/2013.  Findings:  Normal compressibility of bilateral common femoral, superficial femoral, and popliteal veins is demonstrated, as well as the visualized proximal calf veins.  Examination is somewhat limited by body habitus.  No filling defects to suggest DVT on grayscale or color Doppler imaging.  Doppler waveforms show normal direction of venous flow, normal respiratory phasicity and response to augmentation.  IMPRESSION: No evidence of deep vein thrombosis in either lower extremity. Examination is somewhat limited by body habitus.   Original Report Authenticated By: Carey Bullocks, M.D.    Scheduled Meds: . aspirin EC  81 mg Oral Daily  . gatifloxacin  1 drop Right Eye QID  . hydrochlorothiazide  25 mg Oral Daily  . irbesartan  150 mg Oral Daily  . ketorolac  1 drop Right Eye QID  . LORazepam      . LORazepam  1 mg Intravenous Once  . [START ON 07/12/2013] pneumococcal 23 valent vaccine  0.5 mL Intramuscular Tomorrow-1000  . prednisoLONE acetate  1 drop Right Eye QID  . sodium chloride  3 mL Intravenous Q12H   Continuous Infusions:   Active Problems:   Abdominal pain, unspecified site   Stroke   HTN (hypertension)    Time spent: 35    [facility] Ludger Bones  [facility] Pager [phone]. If 7PM-7AM, please contact night-coverage at [email or web address], password [facility] 07/11/2013, 11:17 AM  LOS: 1 day

## 2013-07-11 NOTE — H&P (Signed)
[facility] History and Physical  STEPHANIE LITTMAN UJW:[id number] DOB: [birth year]    PCP:   Karie Chimera, MD   Chief Complaint: left leg weakness and left upper ext numbness for the past two weeks.  HPI: Deborah Whitehead is an 62 y.o. female  With hx of anxiety, depression, HTN, prior CVA, recent laparoscopic hernia repair, presents to [facility] with 2 weeks hx of left lower extremity weakness and left upper extremity paresthesia.  She denied HA, nausea, vomiting, chest pain, visual changes, slurred speech.  Evaluation in the ER included unremarkable serology, and head CT showed old CVA.  Hospitalist was asked to admit her since MRI was not available at [facility].  Rewiew of Systems:  Constitutional: Negative for malaise, fever and chills. No significant weight loss or weight gain Eyes: Negative for eye pain, redness and discharge, diplopia, visual changes, or flashes of light. ENMT: Negative for ear pain, hoarseness, nasal congestion, sinus pressure and sore throat. No headaches; tinnitus, drooling, or problem swallowing. Cardiovascular: Negative for chest pain, palpitations, diaphoresis, dyspnea and peripheral edema. ; No orthopnea, PND Respiratory: Negative for cough, hemoptysis, wheezing and stridor. No pleuritic chestpain. Gastrointestinal: Negative for nausea, vomiting, diarrhea, constipation, abdominal pain, melena, blood in stool, hematemesis, jaundice and rectal bleeding.    Genitourinary: Negative for frequency, dysuria, incontinence,flank pain and hematuria; Musculoskeletal: Negative for back pain and neck pain. Negative for swelling and trauma.;  Skin: . Negative for pruritus, rash, abrasions, bruising and skin lesion.; ulcerations Neuro: Negative for headache, lightheadedness and neck stiffness. Negative for weakness, altered level of consciousness , altered mental status,  burning feet, involuntary movement, seizure and syncope.  Psych: negative for depression, insomnia,  tearfulness, panic attacks, hallucinations, paranoia, suicidal or homicidal ideation   Past Medical History  Diagnosis Date  . Anxiety   . Arthritis   . Substance abuse   . Hypertension     dr Pecola Leisure     immanuel fp  . Shortness of breath   . GERD (gastroesophageal reflux disease)   . Depression   . Fibroids     abd  . Stroke     memory loss    Past Surgical History  Procedure Laterality Date  . Cesarean section    . Cholecystectomy    . Abdominal hysterectomy    . Ventral hernia repair N/A 06/25/2013    Procedure: LAPAROSCOPIC VENTRAL HERNIA;  Surgeon: Robyne Askew, MD;  Location: [facility] OR;  Service: General;  Laterality: N/A;  . Insertion of mesh N/A 06/25/2013    Procedure: INSERTION OF MESH;  Surgeon: Robyne Askew, MD;  Location: [facility] OR;  Service: General;  Laterality: N/A;    Medications:  HOME MEDS: Prior to Admission medications   Medication Sig Start Date End Date Taking? Authorizing Provider  ALPRAZolam (XANAX) 0.25 MG tablet Take 1 tablet (0.25 mg total) by mouth 3 (three) times daily as needed for sleep or anxiety. 02/19/13  Yes Gavin Pound. Ghim, MD  aspirin 81 MG tablet Take 81 mg by mouth daily.   Yes Historical Provider, MD  gatifloxacin (ZYMAXID) 0.5 % SOLN Place 1 drop into the right eye 4 (four) times daily.   Yes Historical Provider, MD  ketorolac (ACULAR) 0.4 % SOLN Place 1 drop into the right eye 4 (four) times daily.   Yes Historical Provider, MD  loratadine (CLARITIN) 10 MG tablet Take 10 mg by mouth daily as needed for allergies. For allergy symptoms   Yes Historical Provider, MD  prednisoLONE acetate (  PRED FORTE) 1 % ophthalmic suspension Place 1 drop into the right eye 4 (four) times daily.   Yes Historical Provider, MD  valsartan-hydrochlorothiazide (DIOVAN-HCT) 160-25 MG per tablet Take 1 tablet by mouth daily.   Yes Historical Provider, MD     Allergies:  No Known Allergies  Social History:   reports that she has been smoking.  She has never  used smokeless tobacco. She reports that she uses illicit drugs (Cocaine). She reports that she does not drink alcohol.  Family History: Family History  Problem Relation Age of Onset  . Cancer Mother     Breast  . Cancer Father     Prostate     Physical Exam: Filed Vitals:   07/10/13 1932 07/10/13 2109 07/11/13 0202 07/11/13 0602  BP:  126/94 148/78 130/71  Pulse:  98 72 77  Temp:  97.8 F (36.6 C) 98 F (36.7 C) 97.8 F (36.6 C)  TempSrc:  Oral Oral Oral  Resp:  20 22 20   Height:  5\' 3"  (1.6 m)    Weight:  100.9 kg (222 lb 7.1 oz)    SpO2: 98% 100% 99% 97%   Blood pressure 130/71, pulse 77, temperature 97.8 F (36.6 C), temperature source Oral, resp. rate 20, height 5\' 3"  (1.6 m), weight 100.9 kg (222 lb 7.1 oz), SpO2 97.00%.  GEN:  Pleasant  patient lying in the stretcher in no acute distress; cooperative with exam. PSYCH:  alert and oriented x4; does not appear anxious or depressed; affect is appropriate. HEENT: Mucous membranes pink and anicteric; PERRLA; EOM intact; no cervical lymphadenopathy nor thyromegaly or carotid bruit; no JVD; There were no stridor. Neck is very supple. Breasts:: Not examined CHEST WALL: No tenderness CHEST: Normal respiration, clear to auscultation bilaterally.  HEART: Regular rate and rhythm.  There are no murmur, rub, or gallops.   BACK: No kyphosis or scoliosis; no CVA tenderness ABDOMEN: soft and non-tender; no masses, no organomegaly, normal abdominal bowel sounds; no pannus; no intertriginous candida. There is no rebound and no distention. Rectal Exam: Not done EXTREMITIES: No bone or joint deformity; age-appropriate arthropathy of the hands and knees; no edema; no ulcerations.  There is no calf tenderness. Genitalia: not examined PULSES: 2+ and symmetric SKIN: Normal hydration no rash or ulceration CNS: Cranial nerves 2-12 grossly intact no focal lateralizing neurologic deficit.  Speech is fluent; uvula elevated with phonation, facial  symmetry and tongue midline. DTR are normal bilaterally, cerebella exam is intact, barbinski is negative and strength is weaker on the left compared to right.  No sensory loss.   Labs on Admission:  Basic Metabolic Panel:  Recent Labs Lab 07/10/13 1621  NA 137  K 3.9  CL 98  CO2 31  GLUCOSE 75  BUN 16  CREATININE 1.10  CALCIUM 10.0   Liver Function Tests:  Recent Labs Lab 07/10/13 1621  AST 23  ALT 18  ALKPHOS 120*  BILITOT 0.2*  PROT 7.9  ALBUMIN 3.3*   No results found for this basename: LIPASE, AMYLASE,  in the last 168 hours No results found for this basename: AMMONIA,  in the last 168 hours CBC:  Recent Labs Lab 07/10/13 1621  WBC 6.8  NEUTROABS 3.7  HGB 11.8*  HCT 37.0  MCV 93.7  PLT 279   Cardiac Enzymes:  Recent Labs Lab 07/10/13 1621  TROPONINI <0.30    CBG:  Recent Labs Lab 07/10/13 1557  GLUCAP 74     Radiological Exams on Admission: Ct Head  Wo Contrast  07/10/2013   *RADIOLOGY REPORT*  Clinical Data: Left-sided numbness.  CT HEAD WITHOUT CONTRAST  Technique:  Contiguous axial images were obtained from the base of the skull through the vertex without contrast.  Comparison: CT scan dated 01/27/2010  Findings: There is no acute intracranial hemorrhage, infarction, or mass lesion.  There are scattered periventricular white matter lucencies consistent with chronic small vessel ischemic disease, stable. Several old small lacunar infarcts including the right occipital lobe.  Diffuse mild cerebral cortical atrophy.  Stable benign calcification on the left side of the torn.  No osseous abnormality.  IMPRESSION: No acute intracranial abnormality.  Chronic small vessel ischemic changes.  Several old lacunar infarcts including the right occipital lobe.   Original Report Authenticated By: Francene Boyers, M.D.   US Venous Img Lower Bilateral  07/10/2013   *RADIOLOGY REPORT*  Clinical Data: Bilateral lower leg swelling since ventral hernia repair.   Question DVT.  BILATERAL LOWER EXTREMITY VENOUS DUPLEX ULTRASOUND  Technique:  Gray-scale sonography with graded compression, as well as color Doppler and duplex ultrasound, were performed to evaluate the deep venous system of both lower extremities from the level of the common femoral vein through the popliteal and proximal calf veins.  Spectral Doppler was utilized to evaluate flow at rest and with distal augmentation maneuvers.  Comparison:  Pelvic CT 05/15/2013.  Findings:  Normal compressibility of bilateral common femoral, superficial femoral, and popliteal veins is demonstrated, as well as the visualized proximal calf veins.  Examination is somewhat limited by body habitus.  No filling defects to suggest DVT on grayscale or color Doppler imaging.  Doppler waveforms show normal direction of venous flow, normal respiratory phasicity and response to augmentation.  IMPRESSION: No evidence of deep vein thrombosis in either lower extremity. Examination is somewhat limited by body habitus.   Original Report Authenticated By: Carey Bullocks, M.D.   Assessment/Plan Present on Admission:  . Abdominal pain, unspecified site . HTN (hypertension) CVA Recent Hernia repair.  PLAN:  She likely had a cva 2 weeks ago that CT scan didn't pick up.  Will obtain an MRI of her brain.  If it is positive for a new stroke, then full work up can be initiated.  If her MRI is negative, I would not pursue any further stroke work up.  She has been on ASA and I have continued her meds.  Her hernia repair is healing well.  I have continued her home meds.  She is stable, full code, and will be admitted to [facility] service.  Thank you for allowng me to partake in the care of this nice patient.  Other plans as per orders.  Code Status: FULL Unk Lightning, MD. [facility] Pager [phone] 7pm to 7am.  07/11/2013, 7:07 AM

## 2013-07-12 DIAGNOSIS — E785 Hyperlipidemia, unspecified: Secondary | ICD-10-CM

## 2013-07-12 LAB — HEMOGLOBIN A1C
Hgb A1c MFr Bld: 6.5 % — ABNORMAL HIGH (ref <5.7)
Mean Plasma Glucose: 140 mg/dL — ABNORMAL HIGH (ref <117)

## 2013-07-12 LAB — LIPID PANEL: Cholesterol: 200 mg/dL (ref 0–200)

## 2013-07-12 MED ORDER — CLOPIDOGREL BISULFATE 75 MG PO TABS
75.0000 mg | ORAL_TABLET | Freq: Every day | ORAL | Status: DC
  Administered 2013-07-13 – 2013-07-14 (×2): 75 mg via ORAL
  Filled 2013-07-12 (×3): qty 1

## 2013-07-12 MED ORDER — ATORVASTATIN CALCIUM 10 MG PO TABS
10.0000 mg | ORAL_TABLET | Freq: Every day | ORAL | Status: DC
  Administered 2013-07-12 – 2013-07-13 (×2): 10 mg via ORAL
  Filled 2013-07-12 (×3): qty 1

## 2013-07-12 MED ORDER — PANTOPRAZOLE SODIUM 40 MG PO TBEC
40.0000 mg | DELAYED_RELEASE_TABLET | Freq: Every day | ORAL | Status: DC
  Administered 2013-07-12 – 2013-07-14 (×3): 40 mg via ORAL
  Filled 2013-07-12 (×3): qty 1

## 2013-07-12 MED ORDER — ALUM & MAG HYDROXIDE-SIMETH 200-200-20 MG/5ML PO SUSP: 15.0000 mL | Freq: Four times a day (QID) | ORAL | Status: DC | PRN

## 2013-07-12 NOTE — Progress Notes (Signed)
Rehab Admissions Coordinator Note:  Patient was screened by Clois Dupes for appropriateness for an Inpatient Acute Rehab Consult.  At this time, we are recommending Inpatient Rehab consult which is pending for Monday.   Clois Dupes 07/12/2013, 3:48 PM  I can be reached at [phone].

## 2013-07-12 NOTE — Evaluation (Signed)
Physical Therapy Evaluation Patient Details Name: Deborah Whitehead MRN: [id number] DOB: [birth year] Today's Date: 07/12/2013 Time: 4098-1191 PT Time Calculation (min): 44 min  PT Assessment / Plan / Recommendation History of Present Illness  Admitted with CVA; Unsteady gait  Clinical Impression  Pt admitted with L sided weakness, CVA. Pt currently with functional limitations due to the deficits listed below (see PT Problem List).  Pt will benefit from skilled PT to increase their independence and safety with mobility to allow discharge to the venue listed below.   Pt's score of 32/56 on the Berg Balance Assessment puts her at a significantly high fall risk       PT Assessment  Patient needs continued PT services    Follow Up Recommendations  CIR It is possible and likely that with comprehensive Inpt Rehab, pt will reach modified independent functional level and be able to dc home    Does the patient have the potential to tolerate intense rehabilitation      Barriers to Discharge Decreased caregiver support Is at home independently during the day while daughters are at work    Engineer, agricultural with 5" wheels;3in1 (PT)    Recommendations for Other Services Rehab consult   Frequency Min 4X/week    Precautions / Restrictions Precautions Precautions: Fall   Pertinent Vitals/Pain no apparent distress Noted HR 103 with activity      Mobility  Bed Mobility Bed Mobility: Not assessed Details for Bed Mobility Assistance: in chair upon arrival Transfers Transfers: Sit to Stand;Stand to Sit Sit to Stand: 4: Min assist;From bed;From chair/3-in-1 (form low mat table) Stand to Sit: 4: Min assist Details for Transfer Assistance: reinforced with vc/visual cues safe transfer technique; Required min assist for power-up; Dependent on UEs and momentum for successful sit to stand Ambulation/Gait Ambulation/Gait Assistance: 4: Min assist;3: Mod  assist Ambulation Distance (Feet): 100 Feet Assistive device: Rolling walker Ambulation/Gait Assistance Details: Quite unsteady gait, noted trunk instability/ataxia and decr L heel strike with frequent toe drag; numerous losses of balance, at time requiring mod assist to regain steadiness Gait Pattern: Step-through pattern;Ataxic;Shuffle (L LE sragging behind at times) Stairs: Yes Stairs Assistance: 3: Mod assist Stairs Assistance Details (indicate cue type and reason): Cues for sequence; noted L foot catch on step Stair Management Technique: One rail Left;Step to pattern;Sideways Number of Stairs: 3 Modified Rankin (Stroke Patients Only) Pre-Morbid Rankin Score: Moderate disability Modified Rankin: Moderately severe disability    Exercises     PT Diagnosis: Difficulty walking  PT Problem List: Decreased strength;Decreased activity tolerance;Decreased balance;Decreased mobility;Decreased coordination;Decreased knowledge of use of DME;Decreased safety awareness;Decreased knowledge of precautions PT Treatment Interventions: DME instruction;Gait training;Stair training;Functional mobility training;Therapeutic activities;Therapeutic exercise;Balance training;Neuromuscular re-education;Patient/family education     PT Goals(Current goals can be found in the care plan section) Acute Rehab PT Goals Patient Stated Goal: Wants to go home PT Goal Formulation: With patient Time For Goal Achievement: 07/26/13 Potential to Achieve Goals: Good  Visit Information  Last PT Received On: 07/12/13 Assistance Needed: +1 History of Present Illness: Admitted with CVA; Unsteady gait       Prior Functioning  Home Living Family/patient expects to be discharged to:: Private residence Living Arrangements: Children (dtr who works days) Available Help at Discharge: Family;Available PRN/intermittently Type of Home: Apartment Home Access: Stairs to enter Entrance Stairs-Number of Steps: 10 Entrance  Stairs-Rails: Right;Left Home Layout: One level Home Equipment: Walker - 2 wheels Prior Function Level of Independence: Independent Comments: Independent prior to most recent  admission earlier this month Communication Communication: No difficulties    Cognition  Cognition Arousal/Alertness: Awake/alert Behavior During Therapy: WFL for tasks assessed/performed Overall Cognitive Status: Within Functional Limits for tasks assessed    Extremity/Trunk Assessment Upper Extremity Assessment Upper Extremity Assessment: Defer to OT evaluation Lower Extremity Assessment Lower Extremity Assessment: LLE deficits/detail LLE Deficits / Details: Hip flexion, 3/5; Knee ext, 3+/5, ankle dorsiflexion, 1/5 LLE Coordination: decreased fine motor;decreased gross motor   Balance Balance Balance Assessed: Yes Static Standing Balance Static Standing - Balance Support: Bilateral upper extremity supported Static Standing - Level of Assistance: 4: Min assist Standardized Balance Assessment Standardized Balance Assessment: Berg Balance Test Berg Balance Test Sit to Stand: Able to stand using hands after several tries Standing Unsupported: Able to stand 2 minutes with supervision Sitting with Back Unsupported but Feet Supported on Floor or Stool: Able to sit safely and securely 2 minutes Stand to Sit: Controls descent by using hands Transfers: Able to transfer with verbal cueing and /or supervision Standing Unsupported with Eyes Closed: Able to stand 10 seconds with supervision Standing Ubsupported with Feet Together: Able to place feet together independently but unable to hold for 30 seconds From Standing, Reach Forward with Outstretched Arm: Can reach forward >12 cm safely (5") From Standing Position, Pick up Object from Floor: Unable to pick up shoe, but reaches 2-5 cm (1-2") from shoe and balances independently From Standing Position, Turn to Look Behind Over each Shoulder: Turn sideways only but  maintains balance Turn 360 Degrees: Able to turn 360 degrees safely but slowly Standing Unsupported, Alternately Place Feet on Step/Stool: Able to complete >2 steps/needs minimal assist Standing Unsupported, One Foot in Front: Able to take small step independently and hold 30 seconds Standing on One Leg: Tries to lift leg/unable to hold 3 seconds but remains standing independently Total Score: 32  End of Session PT - End of Session Equipment Utilized During Treatment: Gait belt Activity Tolerance: Patient tolerated treatment well Patient left: with call bell/phone within reach;in chair;with chair alarm set (sitting EOB) Nurse Communication: Mobility status  GP     Olen Pel [place], [place] [phone]  07/12/2013, 11:14 AM

## 2013-07-12 NOTE — Progress Notes (Addendum)
[facility] PROGRESS NOTE  Deborah Whitehead ZOX:[id number] DOB: Mar 30, 1951 DOA: 07/10/2013 PCP: Karie Chimera, MD  Assessment/Plan: 1. CVA- MRI +; MRA done, echo, carotids ordered, plavix, PT/OT, HgbA1C, FLP showed elevated lipids- add statin 2. HTN- on lower side- holding parameters and d/c HCTZ 3. S/p hernia repair- Dr. Carolynne Edouard  Code Status: full Family Communication: patient Disposition Plan:    Consultants:    Procedures:  Echo  MRI/MRA  carotids  Antibiotics:    HPI/Subjective: Feeling better today Anxious about further studies  Objective: Filed Vitals:   07/11/13 2200 07/12/13 0200 07/12/13 0600 07/12/13 1040  BP: 123/80 120/82 100/71 100/70  Pulse: 76 82 66 90  Temp: 98.4 F (36.9 C) 98.3 F (36.8 C) 97.7 F (36.5 C) 98.4 F (36.9 C)  TempSrc:    Oral  Resp: 20 20 20 18   Height:      Weight:      SpO2: 99% 99% 100% 100%    Intake/Output Summary (Last 24 hours) at 07/12/13 1110 Last data filed at 07/12/13 0850  Gross per 24 hour  Intake    483 ml  Output      0 ml  Net    483 ml   Filed Weights   07/10/13 1440 07/10/13 2109  Weight: 97.07 kg (214 lb) 100.9 kg (222 lb 7.1 oz)    Exam:  GEN: A+Ox3, NAD  CHEST: Normal respiration, clear to auscultation bilaterally.  HEART: Regular rate and rhythm. There are no murmur, rub, or gallops.  ABDOMEN: soft and non-tender; no masses, no organomegaly, normal abdominal bowel sounds; no pannus; no intertriginous candida. There is no rebound and no distention.  EXTREMITIES: No bone or joint deformity; age-appropriate arthropathy of the hands and knees; no edema; no ulcerations. There is no calf tenderness.     Data Reviewed: Basic Metabolic Panel:  Recent Labs Lab 07/10/13 1621  NA 137  K 3.9  CL 98  CO2 31  GLUCOSE 75  BUN 16  CREATININE 1.10  CALCIUM 10.0   Liver Function Tests:  Recent Labs Lab 07/10/13 1621  AST 23  ALT 18  ALKPHOS 120*  BILITOT 0.2*  PROT 7.9  ALBUMIN  3.3*   No results found for this basename: LIPASE, AMYLASE,  in the last 168 hours No results found for this basename: AMMONIA,  in the last 168 hours CBC:  Recent Labs Lab 07/10/13 1621  WBC 6.8  NEUTROABS 3.7  HGB 11.8*  HCT 37.0  MCV 93.7  PLT 279   Cardiac Enzymes:  Recent Labs Lab 07/10/13 1621  TROPONINI <0.30   BNP (last 3 results) No results found for this basename: PROBNP,  in the last 8760 hours CBG:  Recent Labs Lab 07/10/13 1557  GLUCAP 74    Recent Results (from the past 240 hour(s))  URINE CULTURE     Status: None   Collection Time    07/10/13  4:00 PM      Result Value Range Status   Specimen Description URINE, CLEAN CATCH   Final   Special Requests NONE   Final   Culture  Setup Time 07/10/2013 22:28   Final   Colony Count 35,000 COLONIES/ML   Final   Culture     Final   Value: Multiple bacterial morphotypes present, none predominant. Suggest appropriate recollection if clinically indicated.   Report Status 07/11/2013 FINAL   Final     Studies: Ct Head Wo Contrast  07/10/2013   *RADIOLOGY REPORT*  Clinical Data: Left-sided  numbness.  CT HEAD WITHOUT CONTRAST  Technique:  Contiguous axial images were obtained from the base of the skull through the vertex without contrast.  Comparison: CT scan dated 01/27/2010  Findings: There is no acute intracranial hemorrhage, infarction, or mass lesion.  There are scattered periventricular white matter lucencies consistent with chronic small vessel ischemic disease, stable. Several old small lacunar infarcts including the right occipital lobe.  Diffuse mild cerebral cortical atrophy.  Stable benign calcification on the left side of the torn.  No osseous abnormality.  IMPRESSION: No acute intracranial abnormality.  Chronic small vessel ischemic changes.  Several old lacunar infarcts including the right occipital lobe.   Original Report Authenticated By: Francene Boyers, M.D.   Mr [facility] Wo Contrast  07/11/2013    *RADIOLOGY REPORT*  Clinical Data:  Left leg weakness and left upper extremity numbness.  MRI HEAD WITHOUT CONTRAST MRA HEAD WITHOUT CONTRAST  Technique:  Multiplanar, multiecho pulse sequences of the brain and surrounding structures were obtained without intravenous contrast. Angiographic images of the head were obtained using MRA technique without contrast.  Comparison:  Head CT 07/10/2013  MRI HEAD  Findings:  There are numerous punctate foci of acute /subacute infarction.  There is a punctate focus in the right thalamus. There is a punctate focus in the right medial parietal occipital parietal junction cortex.  There are numerous punctate foci in the right parietal region along the margin of the right anterior and middle cerebral artery junction.  No large confluent infarction. No swelling or hemorrhage.  There are chronic small vessel changes affecting the pons.  There are a few old small vessel cerebellar infarctions.  There are a few old small vessel infarctions in the thalami and there is moderate chronic small vessel disease affecting the cerebral hemispheric deep and subcortical white matter.  There is old infarction in the right basal ganglia/external capsule region.  No mass lesion, hemorrhage, hydrocephalus or extra-axial collection.  No pituitary mass.  No inflammatory sinus disease.  No skull or skull base lesion.  IMPRESSION: Numerous punctate foci of acute infarction in the right hemisphere as outlined above.  This includes involvement of the right thalamus, occipital parietal junction region, and parietal lobe along the junction of the anterior and middle cerebral territories. Findings could be due to watershed infarction or micro emboli.  No mass effect or hemorrhage.  Moderate chronic small vessel ischemic changes elsewhere throughout the brain as outlined above.  MRA HEAD  Findings: Both internal carotid arteries are patent into the brain. There is narrowing and irregularity in both carotid  siphon regions, right more than left.  Narrowing of the right carotid siphon region is estimated at 50-70%.  There is flow within both anterior and middle cerebral arteries, but the vessels show narrowing and irregularity throughout consistent with diffuse intracranial atherosclerotic disease.  Both vertebral arteries are patent to the basilar with the left being dominant.  No basilar stenosis.  There is irregularity of the basilar artery consistent with atherosclerotic disease.  Superior cerebellar and posterior cerebral arteries are patent, but show considerable atherosclerotic narrowing with multiple stenoses.  IMPRESSION: No major vessel occlusion.  Severe widespread medium to small vessel atherosclerotic disease throughout all the major intracranial vascular territories with multiple areas of narrowing and irregularity.   Original Report Authenticated By: Paulina Fusi, M.D.   Mr Brain Wo Contrast  07/11/2013   *RADIOLOGY REPORT*  Clinical Data:  Left leg weakness and left upper extremity numbness.  MRI HEAD WITHOUT CONTRAST MRA  HEAD WITHOUT CONTRAST  Technique:  Multiplanar, multiecho pulse sequences of the brain and surrounding structures were obtained without intravenous contrast. Angiographic images of the head were obtained using MRA technique without contrast.  Comparison:  Head CT 07/10/2013  MRI HEAD  Findings:  There are numerous punctate foci of acute /subacute infarction.  There is a punctate focus in the right thalamus. There is a punctate focus in the right medial parietal occipital parietal junction cortex.  There are numerous punctate foci in the right parietal region along the margin of the right anterior and middle cerebral artery junction.  No large confluent infarction. No swelling or hemorrhage.  There are chronic small vessel changes affecting the pons.  There are a few old small vessel cerebellar infarctions.  There are a few old small vessel infarctions in the thalami and there is  moderate chronic small vessel disease affecting the cerebral hemispheric deep and subcortical white matter.  There is old infarction in the right basal ganglia/external capsule region.  No mass lesion, hemorrhage, hydrocephalus or extra-axial collection.  No pituitary mass.  No inflammatory sinus disease.  No skull or skull base lesion.  IMPRESSION: Numerous punctate foci of acute infarction in the right hemisphere as outlined above.  This includes involvement of the right thalamus, occipital parietal junction region, and parietal lobe along the junction of the anterior and middle cerebral territories. Findings could be due to watershed infarction or micro emboli.  No mass effect or hemorrhage.  Moderate chronic small vessel ischemic changes elsewhere throughout the brain as outlined above.  MRA HEAD  Findings: Both internal carotid arteries are patent into the brain. There is narrowing and irregularity in both carotid siphon regions, right more than left.  Narrowing of the right carotid siphon region is estimated at 50-70%.  There is flow within both anterior and middle cerebral arteries, but the vessels show narrowing and irregularity throughout consistent with diffuse intracranial atherosclerotic disease.  Both vertebral arteries are patent to the basilar with the left being dominant.  No basilar stenosis.  There is irregularity of the basilar artery consistent with atherosclerotic disease.  Superior cerebellar and posterior cerebral arteries are patent, but show considerable atherosclerotic narrowing with multiple stenoses.  IMPRESSION: No major vessel occlusion.  Severe widespread medium to small vessel atherosclerotic disease throughout all the major intracranial vascular territories with multiple areas of narrowing and irregularity.   Original Report Authenticated By: Paulina Fusi, M.D.   US Venous Img Lower Bilateral  07/10/2013   *RADIOLOGY REPORT*  Clinical Data: Bilateral lower leg swelling since  ventral hernia repair.  Question DVT.  BILATERAL LOWER EXTREMITY VENOUS DUPLEX ULTRASOUND  Technique:  Gray-scale sonography with graded compression, as well as color Doppler and duplex ultrasound, were performed to evaluate the deep venous system of both lower extremities from the level of the common femoral vein through the popliteal and proximal calf veins.  Spectral Doppler was utilized to evaluate flow at rest and with distal augmentation maneuvers.  Comparison:  Pelvic CT 05/15/2013.  Findings:  Normal compressibility of bilateral common femoral, superficial femoral, and popliteal veins is demonstrated, as well as the visualized proximal calf veins.  Examination is somewhat limited by body habitus.  No filling defects to suggest DVT on grayscale or color Doppler imaging.  Doppler waveforms show normal direction of venous flow, normal respiratory phasicity and response to augmentation.  IMPRESSION: No evidence of deep vein thrombosis in either lower extremity. Examination is somewhat limited by body habitus.   Original Report  Authenticated By: Carey Bullocks, M.D.    Scheduled Meds: . aspirin EC  81 mg Oral Daily  . gatifloxacin  1 drop Right Eye QID  . hydrochlorothiazide  25 mg Oral Daily  . irbesartan  150 mg Oral Daily  . ketorolac  1 drop Right Eye QID  . LORazepam  1 mg Intravenous Once  . pantoprazole  40 mg Oral Daily  . pneumococcal 23 valent vaccine  0.5 mL Intramuscular Tomorrow-1000  . prednisoLONE acetate  1 drop Right Eye QID  . sodium chloride  3 mL Intravenous Q12H   Continuous Infusions:   Active Problems:   Abdominal pain, unspecified site   Stroke   HTN (hypertension)    Time spent: 35    [facility], Sanaz Scarlett  [facility] Pager [phone]. If 7PM-7AM, please contact night-coverage at [email or web address], password [facility] 07/12/2013, 11:10 AM  LOS: 2 days

## 2013-07-12 NOTE — Consult Note (Signed)
Referring Physician: Dr. Benjamine Mola    Chief Complaint: Weakness and numbness of left side over the past 2 weeks.  HPI: Deborah Whitehead is an 62 y.o. female history of hypertension, presenting with weakness involving left lower extremity and numbness involving left upper extremity with onset about 2 weeks ago. She's noticed changes and ambulation with left lower extremity weakness. Numbness has persisted and left upper extremity. She's had no changes in speech. CT scan of her head showed no acute intracranial abnormality. Several old lacunar infarcts in the right occipital region were noted. MRI study however showed numerous punctate foci of acute infarction involving the right hemisphere, including right thalamus, occipital parietal junction, parietal lobe and a watershed area between the middle and anterior cerebral artery territories. NIH stroke score was 3.  LSN: 2 weeks ago tPA Given: No: Beyond time under for treatment consideration MRankin: 1  Past Medical History  Diagnosis Date  . Anxiety   . Arthritis   . Substance abuse   . Hypertension     dr Pecola Leisure     immanuel fp  . Shortness of breath   . GERD (gastroesophageal reflux disease)   . Depression   . Fibroids     abd  . Stroke     memory loss    Family History  Problem Relation Age of Onset  . Cancer Mother     Breast  . Cancer Father     Prostate     Medications: I have reviewed the patient's current medications.  ROS: History obtained from the patient  General ROS: negative for - chills, fatigue, fever, night sweats, weight gain or weight loss Psychological ROS: negative for - behavioral disorder, hallucinations, memory difficulties, mood swings or suicidal ideation Ophthalmic ROS: negative for - blurry vision, double vision, eye pain or loss of vision ENT ROS: negative for - epistaxis, nasal discharge, oral lesions, sore throat, tinnitus or vertigo Allergy and Immunology ROS: negative for - hives or itchy/watery  eyes Hematological and Lymphatic ROS: negative for - bleeding problems, bruising or swollen lymph nodes Endocrine ROS: negative for - galactorrhea, hair pattern changes, polydipsia/polyuria or temperature intolerance Respiratory ROS: negative for - cough, hemoptysis, shortness of breath or wheezing Cardiovascular ROS: negative for - chest pain, dyspnea on exertion, edema or irregular heartbeat Gastrointestinal ROS: negative for - abdominal pain, diarrhea, hematemesis, nausea/vomiting or stool incontinence Genito-Urinary ROS: negative for - dysuria, hematuria, incontinence or urinary frequency/urgency Musculoskeletal ROS: negative for - joint swelling or muscular weakness Neurological ROS: as noted in HPI Dermatological ROS: negative for rash and skin lesion changes  Physical Examination: Blood pressure 100/70, pulse 90, temperature 98.4 F (36.9 C), temperature source Oral, resp. rate 18, height 5\' 3"  (1.6 m), weight 100.9 kg (222 lb 7.1 oz), SpO2 100.00%.  Neurologic Examination: Mental Status: Alert, oriented, thought content appropriate.  Speech fluent without evidence of aphasia. Able to follow commands without difficulty. Cranial Nerves: II-Visual fields were normal except for equivocal mild left visual field defect. III/IV/VI-Pupils were equal and reacted. Extraocular movements were full and conjugate.    V/VII-mild left facial numbness; no facial weakness. VIII-normal. X-normal speech. Motor: Mild proximal weakness of left lower extremity; motor exam otherwise normal. Muscle tone was throughout. Sensory: No sensory changes over upper and lower extremities. Deep Tendon Reflexes: 2+ and symmetric. Plantars: Mute bilaterally Cerebellar: Normal finger-to-nose testing. Carotid auscultation: Normal   Assessment: 62 y.o. female 62 year old lady with hypertension presenting with multiple small areas of acute infarction involving right hemisphere.  Stroke Risk Factors - family  history, hyperlipidemia and hypertension  Plan: 1. HgbA1c, fasting lipid panel 2. MRI, MRA  of the brain without contrast 3. PT consult, OT consult, Speech consult 4. Echocardiogram 5. Carotid dopplers 6. Prophylactic therapy-Antiplatelet med: Plavix 75 mg per day 7. Risk factor modification 8. Telemetry monitoring  C.R. Roseanne Reno, MD [facility] [phone]  07/12/2013, 11:58 AM

## 2013-07-12 NOTE — Evaluation (Signed)
Occupational Therapy Evaluation Patient Details Name: Deborah Whitehead MRN: [id number] DOB: 05-Nov-1951 Today's Date: 07/12/2013 Time: 4098-1191 OT Time Calculation (min): 25 min  OT Assessment / Plan / Recommendation History of present illness Pt with hx of anxiety, depression, HTN, prior CVA, recent laparoscopic hernia repair, presents to [facility] with 2 weeks hx of left lower extremity weakness and left upper extremity paresthesia. Stroke workup underway.    Clinical Impression   Pt admitted with left side weakness.  Pt requiring assist for ADLs and functional mobility at this time.  Pt continues to present with left side weakness as well as generalized weakness.  Pt reports her daughter works during the day therefore pt would be alone several hours daily. Pt unsafe at this time to be home alone. Recommending CIR to further maximize independence and safety with ADLs before eventual return home.    OT Assessment  Patient needs continued OT Services    Follow Up Recommendations  CIR    Barriers to Discharge Decreased caregiver support Daughter works during the day.  Equipment Recommendations  3 in 1 bedside comode;Tub/shower bench    Recommendations for Other Services Rehab consult  Frequency  Min 3X/week    Precautions / Restrictions Precautions Precautions: Fall   Pertinent Vitals/Pain See vitals    ADL  Eating/Feeding: Performed;Modified independent Where Assessed - Eating/Feeding: Chair Grooming: Performed;Wash/dry hands;Min guard Where Assessed - Grooming: Unsupported standing Upper Body Bathing: Simulated;Set up Where Assessed - Upper Body Bathing: Unsupported sitting Lower Body Bathing: Simulated;Min guard Where Assessed - Lower Body Bathing: Supported sit to stand Upper Body Dressing: Simulated;Minimal assistance Where Assessed - Upper Body Dressing: Unsupported sitting Lower Body Dressing: Performed;Minimal assistance Where Assessed - Lower Body Dressing:  Supported sit to stand Toilet Transfer: Performed;Minimal assistance Toilet Transfer Method: Sit to Barista: Bedside commode Toileting - Clothing Manipulation and Hygiene: Performed;Min guard Where Assessed - Engineer, mining and Hygiene: Standing Equipment Used: Gait belt;Rolling walker Transfers/Ambulation Related to ADLs: min guard with RW ambulating in room.  Left knee buckled x1 but pt able to self correct without physical assist. ADL Comments: Pt on bedside commode on OT arrival.  Pt requires guarding during all standing components of ADLs for safety due to LLE weakness.     OT Diagnosis: Generalized weakness;Paresis  OT Problem List: Decreased strength;Decreased activity tolerance;Impaired balance (sitting and/or standing);Decreased knowledge of use of DME or AE;Impaired UE functional use;Impaired sensation OT Treatment Interventions: Self-care/ADL training;Neuromuscular education;DME and/or AE instruction;Therapeutic activities;Patient/family education;Balance training   OT Goals(Current goals can be found in the care plan section) Acute Rehab OT Goals Patient Stated Goal: Wants to go home OT Goal Formulation: With patient Time For Goal Achievement: 07/26/13 Potential to Achieve Goals: Good ADL Goals Pt/caregiver will Perform Home Exercise Program: For improved balance;With Supervision, verbal cues required/provided  Visit Information  Last OT Received On: 07/12/13 Assistance Needed: +1 History of Present Illness: Admitted with CVA; Unsteady gait       Prior Functioning     Home Living Family/patient expects to be discharged to:: Private residence Living Arrangements: Children Available Help at Discharge: Family;Available PRN/intermittently Type of Home: Apartment Home Access: Stairs to enter Entrance Stairs-Number of Steps: 10 Entrance Stairs-Rails: Right;Left Home Layout: One level Home Equipment: Walker - 2 wheels Prior  Function Level of Independence: Independent Comments: Independent prior to most recent admission earlier this month Communication Communication: No difficulties         Vision/Perception Vision - Assessment Additional Comments: Pt reports she  had laser sx earlier this month (did not see this in chart).  States her eyes have been giving her trouble for some time. Pt requesting to rest and declining vision assessment at this time. will continue to assess next vision.   Cognition  Cognition Arousal/Alertness: Awake/alert Behavior During Therapy: WFL for tasks assessed/performed Overall Cognitive Status: Within Functional Limits for tasks assessed    Extremity/Trunk Assessment Upper Extremity Assessment Upper Extremity Assessment: RUE deficits/detail;LUE deficits/detail;Generalized weakness RUE Deficits / Details: 3+/5 throughout. Pt states RUE has been weak since stroke in 2010 but feels generally weak now due to deconditioning. RUE Sensation: decreased light touch RUE Coordination: decreased fine motor;decreased gross motor LUE Deficits / Details: 3+/5 throughout LUE Sensation: decreased light touch LUE Coordination: decreased fine motor;decreased gross motor Lower Extremity Assessment Lower Extremity Assessment: LLE deficits/detail LLE Deficits / Details: Hip flexion, 3/5; Knee ext, 3+/5, ankle dorsiflexion, 1/5 LLE Coordination: decreased fine motor;decreased gross motor     Mobility Bed Mobility Bed Mobility: Not assessed Details for Bed Mobility Assistance: in chair upon arrival Transfers Transfers: Sit to Stand;Stand to Sit Sit to Stand: 4: Min assist;From chair/3-in-1;With upper extremity assist;With armrests Stand to Sit: 4: Min guard;To chair/3-in-1;With armrests;With upper extremity assist Details for Transfer Assistance: Assist initally for power up.      Exercise     Balance    End of Session OT - End of Session Equipment Utilized During Treatment: Rolling  walker Activity Tolerance: Patient limited by fatigue Patient left: in chair;with call bell/phone within reach;with chair alarm set Nurse Communication: Mobility status  GO   07/12/2013 Cipriano Mile OTR/L Pager [phone] Office [phone]   Cipriano Mile 07/12/2013, 11:45 AM

## 2013-07-12 NOTE — Progress Notes (Signed)
VASCULAR LAB PRELIMINARY  PRELIMINARY  PRELIMINARY  PRELIMINARY  Carotid duplex completed.    Preliminary report:  Bilateral:  Less than 40% ICA stenosis.  Vertebral artery flow is antegrade.     Kamdin Follett, RVS 07/12/2013, 12:09 PM

## 2013-07-12 NOTE — Progress Notes (Signed)
Pts IV deaccessed, she is refusing new IV

## 2013-07-13 ENCOUNTER — Telehealth (INDEPENDENT_AMBULATORY_CARE_PROVIDER_SITE_OTHER): Payer: Self-pay

## 2013-07-13 DIAGNOSIS — I6340 Cerebral infarction due to embolism of unspecified cerebral artery: Secondary | ICD-10-CM

## 2013-07-13 DIAGNOSIS — E785 Hyperlipidemia, unspecified: Secondary | ICD-10-CM | POA: Diagnosis present

## 2013-07-13 DIAGNOSIS — F17210 Nicotine dependence, cigarettes, uncomplicated: Secondary | ICD-10-CM | POA: Diagnosis present

## 2013-07-13 DIAGNOSIS — E669 Obesity, unspecified: Secondary | ICD-10-CM | POA: Diagnosis present

## 2013-07-13 NOTE — Progress Notes (Signed)
[facility] PROGRESS NOTE  Deborah Whitehead:[id number] DOB: 1951/02/20 DOA: 07/10/2013 PCP: Karie Chimera, MD  Assessment/Plan: CVA- MRI +; MRA done,  echo pending carotids Bilateral: Less than 40% ICA stenosis. Vertebral artery flow is antegrade plavix PT/OT HgbA1C 6.5 FLP showed elevated lipids- add statin  HTN- on lower side- holding parameters and d/c HCTZ  S/p hernia repair- Dr. Carolynne Edouard  Code Status: full Family Communication: patient Disposition Plan:    Consultants:    Procedures:  Echo  MRI/MRA  carotids  Antibiotics:    HPI/Subjective: Tearful from anxiety of further studies  Objective: Filed Vitals:   07/12/13 2241 07/13/13 0220 07/13/13 0525 07/13/13 0900  BP: 109/75 109/66 117/78 98/69  Pulse: 74 79 78 82  Temp: 97.9 F (36.6 C) 98 F (36.7 C) 98.1 F (36.7 C) 98.9 F (37.2 C)  TempSrc: Oral Oral Oral Oral  Resp:  20 20 20   Height:      Weight:      SpO2: 100% 97% 96% 100%    Intake/Output Summary (Last 24 hours) at 07/13/13 1053 Last data filed at 07/13/13 0900  Gross per 24 hour  Intake    360 ml  Output      0 ml  Net    360 ml   Filed Weights   07/10/13 1440 07/10/13 2109  Weight: 97.07 kg (214 lb) 100.9 kg (222 lb 7.1 oz)    Exam:  GEN: A+Ox3, NAD  CHEST: Normal respiration, clear to auscultation bilaterally.  HEART: Regular rate and rhythm. There are no murmur, rub, or gallops.  ABDOMEN: soft and non-tender; no masses, no organomegaly, normal abdominal bowel sounds; no pannus; no intertriginous candida. There is no rebound and no distention.  EXTREMITIES: No bone or joint deformity; age-appropriate arthropathy of the hands and knees; no edema; no ulcerations. There is no calf tenderness.     Data Reviewed: Basic Metabolic Panel:  Recent Labs Lab 07/10/13 1621  NA 137  K 3.9  CL 98  CO2 31  GLUCOSE 75  BUN 16  CREATININE 1.10  CALCIUM 10.0   Liver Function Tests:  Recent Labs Lab  07/10/13 1621  AST 23  ALT 18  ALKPHOS 120*  BILITOT 0.2*  PROT 7.9  ALBUMIN 3.3*   No results found for this basename: LIPASE, AMYLASE,  in the last 168 hours No results found for this basename: AMMONIA,  in the last 168 hours CBC:  Recent Labs Lab 07/10/13 1621  WBC 6.8  NEUTROABS 3.7  HGB 11.8*  HCT 37.0  MCV 93.7  PLT 279   Cardiac Enzymes:  Recent Labs Lab 07/10/13 1621  TROPONINI <0.30   BNP (last 3 results) No results found for this basename: PROBNP,  in the last 8760 hours CBG:  Recent Labs Lab 07/10/13 1557  GLUCAP 74    Recent Results (from the past 240 hour(s))  URINE CULTURE     Status: None   Collection Time    07/10/13  4:00 PM      Result Value Range Status   Specimen Description URINE, CLEAN CATCH   Final   Special Requests NONE   Final   Culture  Setup Time 07/10/2013 22:28   Final   Colony Count 35,000 COLONIES/ML   Final   Culture     Final   Value: Multiple bacterial morphotypes present, none predominant. Suggest appropriate recollection if clinically indicated.   Report Status 07/11/2013 FINAL   Final     Studies: Mr [facility]  Wo Contrast  07/11/2013   *RADIOLOGY REPORT*  Clinical Data:  Left leg weakness and left upper extremity numbness.  MRI HEAD WITHOUT CONTRAST MRA HEAD WITHOUT CONTRAST  Technique:  Multiplanar, multiecho pulse sequences of the brain and surrounding structures were obtained without intravenous contrast. Angiographic images of the head were obtained using MRA technique without contrast.  Comparison:  Head CT 07/10/2013  MRI HEAD  Findings:  There are numerous punctate foci of acute /subacute infarction.  There is a punctate focus in the right thalamus. There is a punctate focus in the right medial parietal occipital parietal junction cortex.  There are numerous punctate foci in the right parietal region along the margin of the right anterior and middle cerebral artery junction.  No large confluent infarction. No swelling  or hemorrhage.  There are chronic small vessel changes affecting the pons.  There are a few old small vessel cerebellar infarctions.  There are a few old small vessel infarctions in the thalami and there is moderate chronic small vessel disease affecting the cerebral hemispheric deep and subcortical white matter.  There is old infarction in the right basal ganglia/external capsule region.  No mass lesion, hemorrhage, hydrocephalus or extra-axial collection.  No pituitary mass.  No inflammatory sinus disease.  No skull or skull base lesion.  IMPRESSION: Numerous punctate foci of acute infarction in the right hemisphere as outlined above.  This includes involvement of the right thalamus, occipital parietal junction region, and parietal lobe along the junction of the anterior and middle cerebral territories. Findings could be due to watershed infarction or micro emboli.  No mass effect or hemorrhage.  Moderate chronic small vessel ischemic changes elsewhere throughout the brain as outlined above.  MRA HEAD  Findings: Both internal carotid arteries are patent into the brain. There is narrowing and irregularity in both carotid siphon regions, right more than left.  Narrowing of the right carotid siphon region is estimated at 50-70%.  There is flow within both anterior and middle cerebral arteries, but the vessels show narrowing and irregularity throughout consistent with diffuse intracranial atherosclerotic disease.  Both vertebral arteries are patent to the basilar with the left being dominant.  No basilar stenosis.  There is irregularity of the basilar artery consistent with atherosclerotic disease.  Superior cerebellar and posterior cerebral arteries are patent, but show considerable atherosclerotic narrowing with multiple stenoses.  IMPRESSION: No major vessel occlusion.  Severe widespread medium to small vessel atherosclerotic disease throughout all the major intracranial vascular territories with multiple areas of  narrowing and irregularity.   Original Report Authenticated By: Paulina Fusi, M.D.   Mr Brain Wo Contrast  07/11/2013   *RADIOLOGY REPORT*  Clinical Data:  Left leg weakness and left upper extremity numbness.  MRI HEAD WITHOUT CONTRAST MRA HEAD WITHOUT CONTRAST  Technique:  Multiplanar, multiecho pulse sequences of the brain and surrounding structures were obtained without intravenous contrast. Angiographic images of the head were obtained using MRA technique without contrast.  Comparison:  Head CT 07/10/2013  MRI HEAD  Findings:  There are numerous punctate foci of acute /subacute infarction.  There is a punctate focus in the right thalamus. There is a punctate focus in the right medial parietal occipital parietal junction cortex.  There are numerous punctate foci in the right parietal region along the margin of the right anterior and middle cerebral artery junction.  No large confluent infarction. No swelling or hemorrhage.  There are chronic small vessel changes affecting the pons.  There are a few old  small vessel cerebellar infarctions.  There are a few old small vessel infarctions in the thalami and there is moderate chronic small vessel disease affecting the cerebral hemispheric deep and subcortical white matter.  There is old infarction in the right basal ganglia/external capsule region.  No mass lesion, hemorrhage, hydrocephalus or extra-axial collection.  No pituitary mass.  No inflammatory sinus disease.  No skull or skull base lesion.  IMPRESSION: Numerous punctate foci of acute infarction in the right hemisphere as outlined above.  This includes involvement of the right thalamus, occipital parietal junction region, and parietal lobe along the junction of the anterior and middle cerebral territories. Findings could be due to watershed infarction or micro emboli.  No mass effect or hemorrhage.  Moderate chronic small vessel ischemic changes elsewhere throughout the brain as outlined above.  MRA HEAD   Findings: Both internal carotid arteries are patent into the brain. There is narrowing and irregularity in both carotid siphon regions, right more than left.  Narrowing of the right carotid siphon region is estimated at 50-70%.  There is flow within both anterior and middle cerebral arteries, but the vessels show narrowing and irregularity throughout consistent with diffuse intracranial atherosclerotic disease.  Both vertebral arteries are patent to the basilar with the left being dominant.  No basilar stenosis.  There is irregularity of the basilar artery consistent with atherosclerotic disease.  Superior cerebellar and posterior cerebral arteries are patent, but show considerable atherosclerotic narrowing with multiple stenoses.  IMPRESSION: No major vessel occlusion.  Severe widespread medium to small vessel atherosclerotic disease throughout all the major intracranial vascular territories with multiple areas of narrowing and irregularity.   Original Report Authenticated By: Paulina Fusi, M.D.    Scheduled Meds: . atorvastatin  10 mg Oral q1800  . clopidogrel  75 mg Oral Q breakfast  . gatifloxacin  1 drop Right Eye QID  . irbesartan  150 mg Oral Daily  . ketorolac  1 drop Right Eye QID  . LORazepam  1 mg Intravenous Once  . pantoprazole  40 mg Oral Daily  . prednisoLONE acetate  1 drop Right Eye QID  . sodium chloride  3 mL Intravenous Q12H   Continuous Infusions:   Active Problems:   Abdominal pain, unspecified site   Stroke   HTN (hypertension)    Time spent: 35    [facility], Chyrel Taha  [facility] Pager [phone]. If 7PM-7AM, please contact night-coverage at [email or web address], password [facility] 07/13/2013, 10:53 AM  LOS: 3 days

## 2013-07-13 NOTE — Progress Notes (Signed)
Physical Therapy Treatment Patient Details Name: CARLIS BURNSWORTH MRN: [id number] DOB: 04/04/1951 Today's Date: 07/13/2013 Time: 0347-4259 PT Time Calculation (min): 13 min  PT Assessment / Plan / Recommendation  History of Present Illness 62 y o female with 2 weeks hx of left lower extremity weakness and left upper extremity paresthesia.   Clinical Impression    PT Comments   Making some progress, though pt extremely anxious about going for a test  Follow Up Recommendations  CIR     Does the patient have the potential to tolerate intense rehabilitation     Barriers to Discharge        Equipment Recommendations  Rolling walker with 5" wheels;3in1 (PT)    Recommendations for Other Services    Frequency Min 4X/week   Progress towards PT Goals Progress towards PT goals: Progressing toward goals  Plan Current plan remains appropriate    Precautions / Restrictions Precautions Precautions: Fall Restrictions Weight Bearing Restrictions: No   Pertinent Vitals/Pain no apparent distress     Mobility  Bed Mobility Bed Mobility: Not assessed Details for Bed Mobility Assistance: in chair upon arrival Transfers Transfers: Sit to Stand;Stand to Sit Sit to Stand: 4: Min assist;With upper extremity assist;From chair/3-in-1;From toilet Stand to Sit: 4: Min assist;With upper extremity assist;To chair/3-in-1;To toilet Details for Transfer Assistance: Assist initally for power up. reinforced with vc/visual cues safe transfer technique Ambulation/Gait Ambulation/Gait Assistance: 4: Min assist;3: Mod assist Ambulation Distance (Feet): 100 Feet Assistive device: Rolling walker Ambulation/Gait Assistance Details: Quite unsteady gait, noted trunk instability/ataxia and decr L heel strike with noted toe drag Gait Pattern: Step-through pattern;Ataxic;Shuffle    Exercises     PT Diagnosis:    PT Problem List:   PT Treatment Interventions:     PT Goals (current goals can now be found  in the care plan section) Acute Rehab PT Goals Patient Stated Goal: Wants to go home Time For Goal Achievement: 07/26/13 Potential to Achieve Goals: Good  Visit Information  Last PT Received On: 07/13/13 Assistance Needed: +1 History of Present Illness: 15 y o female with 2 weeks hx of left lower extremity weakness and left upper extremity paresthesia.    Subjective Data  Subjective: Very nervous about stroke workup tests Patient Stated Goal: Wants to go home   Cognition  Cognition Arousal/Alertness: Awake/alert Behavior During Therapy: Anxious (about TEE and having something go down her throat) Overall Cognitive Status: Within Functional Limits for tasks assessed    Balance  Balance Balance Assessed: Yes Static Standing Balance Static Standing - Balance Support: Bilateral upper extremity supported Static Standing - Level of Assistance: 4: Min assist  End of Session PT - End of Session Equipment Utilized During Treatment: Gait belt Activity Tolerance: Patient tolerated treatment well Patient left: in bed;with call bell/phone within reach (abotu to be transported to test)   GP     Marylu Lund [facility] [place], [place] [phone]  07/13/2013, 4:12 PM

## 2013-07-13 NOTE — Progress Notes (Signed)
Spoke at length with patient about benefits of TEE and risks of not having it done.  She expressed understanding but declined to allow me to talk to her daughter about it saying "she would want me to have it done"  Will attempt again in the AM  Marlin Canary DO

## 2013-07-13 NOTE — Progress Notes (Signed)
*  PRELIMINARY RESULTS* Echocardiogram 2D Echocardiogram has been performed.  Deborah Whitehead 07/13/2013, 4:47 PM

## 2013-07-13 NOTE — Progress Notes (Signed)
I agree with the following treatment note after reviewing documentation.   Johnston, Sederick Jacobsen Brynn   OTR/L Pager: [phone] Office: [phone] .   

## 2013-07-13 NOTE — Telephone Encounter (Signed)
Pt called from [facility] as inpt. She wanted Dr Carolynne Edouard to be aware she has been admitted for vascular issues and rehab. She will not make her appt tomorrow and requests if possible that Dr Carolynne Edouard or one of his partners could round on her there while she is IP. I advised pt I will send this request to Dr Carolynne Edouard and his assistant.

## 2013-07-13 NOTE — Progress Notes (Signed)
Stroke Team Progress Note  HISTORY Deborah Whitehead is an 62 y.o. female history of hypertension, presenting with weakness involving left lower extremity and numbness involving left upper extremity with onset about 2 weeks ago. She's noticed changes and ambulation with left lower extremity weakness. Numbness has persisted and left upper extremity. She's had no changes in speech. CT scan of her head showed no acute intracranial abnormality. Several old lacunar infarcts in the right occipital region were noted. MRI study however showed numerous punctate foci of acute infarction involving the right hemisphere, including right thalamus, occipital parietal junction, parietal lobe and a watershed area between the middle and anterior cerebral artery territories. NIH stroke score was 3. Patient was not a TPA candidate secondary to delay in arrival. She was admitted for further evaluation and treatment.  SUBJECTIVE No family is at the bedside.  Overall she feels her condition is stable. He is tearful related to current need for hospitalization.   OBJECTIVE Most recent Vital Signs: Filed Vitals:   07/12/13 1726 07/12/13 2241 07/13/13 0220 07/13/13 0525  BP: 115/78 109/75 109/66 117/78  Pulse: 83 74 79 78  Temp: 98.6 F (37 C) 97.9 F (36.6 C) 98 F (36.7 C) 98.1 F (36.7 C)  TempSrc: Oral Oral Oral Oral  Resp: 20  20 20   Height:      Weight:      SpO2: 100% 100% 97% 96%   CBG (last 3)   Recent Labs  07/10/13 1557  GLUCAP 74    IV Fluid Intake:     MEDICATIONS  . atorvastatin  10 mg Oral q1800  . clopidogrel  75 mg Oral Q breakfast  . gatifloxacin  1 drop Right Eye QID  . irbesartan  150 mg Oral Daily  . ketorolac  1 drop Right Eye QID  . LORazepam  1 mg Intravenous Once  . pantoprazole  40 mg Oral Daily  . prednisoLONE acetate  1 drop Right Eye QID  . sodium chloride  3 mL Intravenous Q12H   PRN:  ALPRAZolam, alum & mag hydroxide-simeth, loratadine  Diet:  Cardiac thin  liquids Activity:  Bedrest with Bathroom privileges DVT Prophylaxis:  SCDs   CLINICALLY SIGNIFICANT STUDIES Basic Metabolic Panel:  Recent Labs Lab 07/10/13 1621  NA 137  K 3.9  CL 98  CO2 31  GLUCOSE 75  BUN 16  CREATININE 1.10  CALCIUM 10.0   Liver Function Tests:  Recent Labs Lab 07/10/13 1621  AST 23  ALT 18  ALKPHOS 120*  BILITOT 0.2*  PROT 7.9  ALBUMIN 3.3*   CBC:  Recent Labs Lab 07/10/13 1621  WBC 6.8  NEUTROABS 3.7  HGB 11.8*  HCT 37.0  MCV 93.7  PLT 279   Coagulation:  Recent Labs Lab 07/10/13 1621  LABPROT 13.6  INR 1.06   Cardiac Enzymes:  Recent Labs Lab 07/10/13 1621  TROPONINI <0.30   Urinalysis:  Recent Labs Lab 07/10/13 1600  COLORURINE YELLOW  LABSPEC 1.020  PHURINE 7.5  GLUCOSEU NEGATIVE  HGBUR NEGATIVE  BILIRUBINUR NEGATIVE  KETONESUR NEGATIVE  PROTEINUR NEGATIVE  UROBILINOGEN 1.0  NITRITE NEGATIVE  LEUKOCYTESUR MODERATE*   Lipid Panel    Component Value Date/Time   CHOL 200 07/12/2013 0630   TRIG 105 07/12/2013 0630   HDL 41 07/12/2013 0630   CHOLHDL 4.9 07/12/2013 0630   VLDL 21 07/12/2013 0630   LDLCALC 138* 07/12/2013 0630   HgbA1C  Lab Results  Component Value Date   HGBA1C 6.5* 07/12/2013  Urine Drug Screen:     Component Value Date/Time   LABOPIA NONE DETECTED 07/10/2013 1600   COCAINSCRNUR NONE DETECTED 07/10/2013 1600   LABBENZ NONE DETECTED 07/10/2013 1600   AMPHETMU NONE DETECTED 07/10/2013 1600   THCU NONE DETECTED 07/10/2013 1600   LABBARB NONE DETECTED 07/10/2013 1600    Alcohol Level:  Recent Labs Lab 07/10/13 1621  ETH <11   CT of the brain    MRI of the brain  07/11/2013  Numerous punctate foci of acute infarction in the right hemisphere as outlined above.  This includes involvement of the right thalamus, occipital parietal junction region, and parietal lobe along the junction of the anterior and middle cerebral territories. Findings could be due to watershed infarction or micro emboli.  No  mass effect or hemorrhage.  Moderate chronic small vessel ischemic changes elsewhere throughout the brain  MRA of the brain 07/11/2013  No major vessel occlusion.  Severe widespread medium to small vessel atherosclerotic disease throughout all the major intracranial vascular territories with multiple areas of narrowing and irregularity.    2D Echocardiogram    Carotid Doppler  Bilateral: Less than 40% ICA stenosis. Vertebral artery flow is antegrade.   CXR    EKG  normal EKG, normal sinus rhythm.   Therapy Recommendations CIR  Physical Exam   Obese middle aged African american lady not in distress.Awake alert. Afebrile. Head is nontraumatic. Neck is supple without bruit. Hearing is normal. Cardiac exam no murmur or gallop. Lungs are clear to auscultation. Distal pulses are well felt. Neurological Exam ; Awake alert oriented x 3 normal speech and language. Mild left lower face asymmetry. Tongue midline. No drift. Mild diminished fine finger movements on left. Orbits right over left upper extremity. Mild left grip weak.. Mild diminished left legl sensation . Normal coordination. ASSESSMENT Deborah Whitehead is a 62 y.o. female presenting with left lower extremity weakness and numbness. Imaging confirms multiple right MCA/ACA territory infarcts. Infarcts felt to be embolic secondary to unknown etiology.  On aspirin 81 mg orally every day prior to admission, though she was not taking it regularly. Now on clopidogrel 75 mg orally every day for secondary stroke prevention. Patient with resultant left hemiplegia. Work up underway.   Hx substance abuse, screen neg, hx cocaine use Hypertension Hyperlipidemia, LDL 138, on no statin PTA, now on lipitor 10, goal LDL < 100 (< 70 for diabetics) Hx stroke per record,date and location unknown,  with resultant memory loss Cigarette smoker Obesity, Body mass index is 39.41 kg/(m^2).  HgbA1c 6.5  Hospital day # 3  TREATMENT/PLAN  Continue  clopidogrel 75 mg orally every day for secondary stroke prevention.  Agree with IP rehab consult TEE to look for embolic source. Arranged with [facility] for tomorrow.  If positive for PFO (patent foramen ovale), check bilateral lower extremity venous dopplers to rule out DVT as possible source of stroke. If TEE unrevealing, please schedule outpatient telemetry monitoring or loop recorder placement to assess patient for atrial fibrillation as source of stroke. May be arranged with patient's cardiologist, or cardiologist of choice. Can do after rehab stay if she is a candidate.   Annie Main, MSN, RN, ANVP-BC, ANP-BC, [facility] Pager: [phone] 07/13/2013 10:12 AM  I have personally obtained a history, examined the patient, evaluated imaging results, and formulated the assessment and plan of care. I agree with the above. Delia Heady, MD

## 2013-07-13 NOTE — Progress Notes (Signed)
Occupational Therapy Treatment Patient Details Name: SMRITHI PIGFORD MRN: [id number] DOB: [birth year] Today's Date: 07/13/2013 Time: 7829-5621 OT Time Calculation (min): 17 min  OT Assessment / Plan / Recommendation  History of present illness 62 y o female with 2 weeks hx of left lower extremity weakness and left upper extremity paresthesia.   Clinical Impression Pt progressing towards goals. Pt reports that her LLE is feeling stronger, but stil with Left foot drop and fall risk during transitions and ADL. Pt was very anxious when OTS entered room today about an upcoming procedure. OTS comforted Pt and was able to continue with productive therapy session. Pt continues to benefit from skilled OT in the acute setting and would benefit from CIR to increase independence in ADL and improve balance and safety awareness.      Follow Up Recommendations  CIR       Equipment Recommendations  3 in 1 bedside comode;Tub/shower bench       Frequency Min 3X/week   Progress towards OT Goals Progress towards OT goals: Progressing toward goals  Plan Discharge plan remains appropriate    Precautions / Restrictions Precautions Precautions: Fall Restrictions Weight Bearing Restrictions: No   Pertinent Vitals/Pain Pt reported pain when her LUE cramped. Pt rated it as a 6/10, but said that this has been happening PTA.    ADL  Grooming: Wash/dry hands;Min guard;Performed Where Assessed - Grooming: Supported standing Lower Body Dressing: Minimal assistance Where Assessed - Lower Body Dressing: Supported sit to Pharmacist, hospital: Performed;Minimal Dentist Method: Sit to Barista: Comfort height toilet;Grab bars Toileting - Clothing Manipulation and Hygiene: Minimal assistance;Performed Where Assessed - Engineer, mining and Hygiene: Sit to stand from 3-in-1 or toilet Equipment Used: Gait belt;Rolling walker Transfers/Ambulation Related  to ADLs: min guard with RW, min vc's for safe hand placement during transitions and ambulation ADL Comments: Pt ambulated from chair to bathroom. Pt performed toilet transfer wih vc's for sequencing and safe transfer techniques. Pt able to perform peri care, and then Pt performed BUE grooming tasks at sink. Pt is in need of guarding for safety, and Pt needed one arm supporting her while standing at sink for ADL.    OT Goals(current goals can now be found in the care plan section) Acute Rehab OT Goals Patient Stated Goal: Wants to go home OT Goal Formulation: With patient Time For Goal Achievement: 07/26/13 Potential to Achieve Goals: Good ADL Goals Pt Will Perform Grooming: with modified independence;standing Pt Will Perform Upper Body Bathing: with modified independence;sitting Pt Will Perform Lower Body Bathing: with modified independence;sit to/from stand Pt Will Perform Upper Body Dressing: with modified independence;sitting Pt Will Perform Lower Body Dressing: with modified independence;sit to/from stand Pt Will Transfer to Toilet: with modified independence;ambulating;regular height toilet;bedside commode Pt Will Perform Toileting - Clothing Manipulation and hygiene: with modified independence;sit to/from stand Pt Will Perform Tub/Shower Transfer: Tub transfer;with modified independence;tub bench;ambulating Pt/caregiver will Perform Home Exercise Program: For improved balance;With Supervision, verbal cues required/provided  Visit Information  Last OT Received On: 07/13/13 Assistance Needed: +1 History of Present Illness: 62 y o female with 2 weeks hx of left lower extremity weakness and left upper extremity paresthesia.          Cognition  Cognition Arousal/Alertness: Awake/alert Behavior During Therapy: Anxious (about EEG and having something go down her throat) Overall Cognitive Status: Within Functional Limits for tasks assessed    Mobility  Bed Mobility Bed Mobility: Not  assessed Details for Bed  Mobility Assistance: in chair upon arrival Transfers Transfers: Sit to Stand;Stand to Sit Sit to Stand: 4: Min assist;With upper extremity assist;From chair/3-in-1;From toilet Stand to Sit: 4: Min assist;With upper extremity assist;To chair/3-in-1;To toilet Details for Transfer Assistance: Assist initally for power up. reinforced with vc/visual cues safe transfer technique       Balance Balance Balance Assessed: Yes Static Standing Balance Static Standing - Balance Support: Bilateral upper extremity supported Static Standing - Level of Assistance: 4: Min assist   End of Session OT - End of Session Equipment Utilized During Treatment: Gait belt;Rolling walker Activity Tolerance: Patient limited by fatigue Patient left: in chair;with call bell/phone within reach;with chair alarm set Nurse Communication: Mobility status  GO     Sherryl Manges 07/13/2013, 1:05 PM

## 2013-07-13 NOTE — Progress Notes (Signed)
Attempt to get consent form sign from patient for echocardiogram transesophageal.  Patient refuse states she is scared and don't think she need it.  Educate patient about the procedure.  Dr. Benjamine Mola notified and spoke to the patient via telephone.  Patient unable to decide and continue to say no.   Per Dr. Benjamine Mola to keep the orders in place and give patient time until tomorrow morning to make her decision.  Patient will be NPO after midnight.  Orders will be pass to the next nurse.

## 2013-07-13 NOTE — Consult Note (Signed)
Physical Medicine and Rehabilitation Consult Reason for Consult: CVA Referring Physician: [facility]   HPI: Deborah Whitehead is a 62 y.o. right-handed female with history of hypertension as well as history of CVA. Patient independent prior to admission and driving. Presented 07/11/2013 with left-sided weakness x2 weeks. MRI of the brain showed numerous punctate foci of acute infarction in the right hemisphere of the right thalamus, occipital parietal junction region and parietal lobe along the junction of the anterior and middle cerebral territories. MRA of the head with no major vessel occlusion. Echocardiogram pending. Carotid Dopplers with less than 40% ICA stenosis. Patient did not receive TPA. Neurology services consulted and placed on Plavix for CVA prophylaxis. Patient had been on aspirin prior to admission. Patient tolerating a regular diet. Physical and occupational therapy evaluations completed 07/12/2013 with recommendations for physical medicine rehabilitation consult to consider inpatient rehabilitation services.   Review of Systems  Respiratory: Positive for shortness of breath.   Gastrointestinal:       GERD  Neurological: Positive for weakness.  Psychiatric/Behavioral: Positive for depression.       Anxiety  All other systems reviewed and are negative.   Past Medical History  Diagnosis Date  . Anxiety   . Arthritis   . Substance abuse   . Hypertension     dr Pecola Leisure     immanuel fp  . Shortness of breath   . GERD (gastroesophageal reflux disease)   . Depression   . Fibroids     abd  . Stroke     memory loss   Past Surgical History  Procedure Laterality Date  . Cesarean section    . Cholecystectomy    . Abdominal hysterectomy    . Ventral hernia repair N/A 06/25/2013    Procedure: LAPAROSCOPIC VENTRAL HERNIA;  Surgeon: Robyne Askew, MD;  Location: [facility] OR;  Service: General;  Laterality: N/A;  . Insertion of mesh N/A 06/25/2013    Procedure: INSERTION OF MESH;   Surgeon: Robyne Askew, MD;  Location: [facility] OR;  Service: General;  Laterality: N/A;   Family History  Problem Relation Age of Onset  . Cancer Mother     Breast  . Cancer Father     Prostate   Social History:  reports that she has been smoking.  She has never used smokeless tobacco. She reports that she uses illicit drugs (Cocaine). She reports that she does not drink alcohol. Allergies: No Known Allergies Medications Prior to Admission  Medication Sig Dispense Refill  . ALPRAZolam (XANAX) 0.25 MG tablet Take 1 tablet (0.25 mg total) by mouth 3 (three) times daily as needed for sleep or anxiety.  15 tablet  0  . aspirin 81 MG tablet Take 81 mg by mouth daily.      Marland Kitchen gatifloxacin (ZYMAXID) 0.5 % SOLN Place 1 drop into the right eye 4 (four) times daily.      Marland Kitchen ketorolac (ACULAR) 0.4 % SOLN Place 1 drop into the right eye 4 (four) times daily.      Marland Kitchen loratadine (CLARITIN) 10 MG tablet Take 10 mg by mouth daily as needed for allergies. For allergy symptoms      . prednisoLONE acetate (PRED FORTE) 1 % ophthalmic suspension Place 1 drop into the right eye 4 (four) times daily.      . valsartan-hydrochlorothiazide (DIOVAN-HCT) 160-25 MG per tablet Take 1 tablet by mouth daily.        Home: Home Living Family/patient expects to be discharged to::  Private residence Living Arrangements: Children Available Help at Discharge: Family;Available PRN/intermittently Type of Home: Apartment Home Access: Stairs to enter Entrance Stairs-Number of Steps: 10 Entrance Stairs-Rails: Right;Left Home Layout: One level Home Equipment: Walker - 2 wheels  Functional History: Prior Function Comments: Independent prior to most recent admission earlier this month Functional Status:  Mobility: Bed Mobility Bed Mobility: Not assessed Transfers Transfers: Sit to Stand;Stand to Sit Sit to Stand: 4: Min assist;From chair/3-in-1;With upper extremity assist;With armrests Stand to Sit: 4: Min guard;To  chair/3-in-1;With armrests;With upper extremity assist Ambulation/Gait Ambulation/Gait Assistance: 4: Min assist;3: Mod assist Ambulation Distance (Feet): 100 Feet Assistive device: Rolling walker Ambulation/Gait Assistance Details: Quite unsteady gait, noted trunk instability/ataxia and decr L heel strike with frequent toe drag; numerous losses of balance, at time requiring mod assist to regain steadiness Gait Pattern: Step-through pattern;Ataxic;Shuffle (L LE sragging behind at times) Stairs: Yes Stairs Assistance: 3: Mod assist Stairs Assistance Details (indicate cue type and reason): Cues for sequence; noted L foot catch on step Stair Management Technique: One rail Left;Step to pattern;Sideways Number of Stairs: 3    ADL: ADL Eating/Feeding: Performed;Modified independent Where Assessed - Eating/Feeding: Chair Grooming: Performed;Wash/dry hands;Min guard Where Assessed - Grooming: Unsupported standing Upper Body Bathing: Simulated;Set up Where Assessed - Upper Body Bathing: Unsupported sitting Lower Body Bathing: Simulated;Min guard Where Assessed - Lower Body Bathing: Supported sit to stand Upper Body Dressing: Simulated;Minimal assistance Where Assessed - Upper Body Dressing: Unsupported sitting Lower Body Dressing: Performed;Minimal assistance Where Assessed - Lower Body Dressing: Supported sit to stand Toilet Transfer: Performed;Minimal assistance Toilet Transfer Method: Sit to Barista: Bedside commode Equipment Used: Gait belt;Rolling walker Transfers/Ambulation Related to ADLs: min guard with RW ambulating in room.  Left knee buckled x1 but pt able to self correct without physical assist. ADL Comments: Pt on bedside commode on OT arrival.  Pt requires guarding during all standing components of ADLs for safety due to LLE weakness.   Cognition: Cognition Overall Cognitive Status: Within Functional Limits for tasks assessed Orientation Level:  Oriented to person;Oriented to place;Oriented to situation;Oriented to time Cognition Arousal/Alertness: Awake/alert Behavior During Therapy: WFL for tasks assessed/performed Overall Cognitive Status: Within Functional Limits for tasks assessed  Blood pressure 117/78, pulse 78, temperature 98.1 F (36.7 C), temperature source Oral, resp. rate 20, height 5\' 3"  (1.6 m), weight 100.9 kg (222 lb 7.1 oz), SpO2 96.00%. Physical Exam  Vitals reviewed. HENT:  Head: Normocephalic.  Eyes: EOM are normal.  Neck: Normal range of motion. Neck supple. No thyromegaly present.  Cardiovascular: Normal rate and regular rhythm.   Pulmonary/Chest: Effort normal and breath sounds normal. No respiratory distress.  Abdominal: Soft. Bowel sounds are normal. She exhibits no distension.  Neurological: She is alert.  Patient is pleasant and was able to provide name, place and date of birth. She followed three-step commands. LUE 4+, LLE 3+ HF, KE to 4/5 at ankle. Sensation 1/2 left face, arm, leg.  Mild left central 7. Gaze appears a little dysconjugate. Visual acuity a little impaired also. VF grossly intact. Cognitively displays good insight and awareness.   Skin: Skin is warm and dry.    Results for orders placed during the hospital encounter of 07/10/13 (from the past 24 hour(s))  LIPID PANEL     Status: Abnormal   Collection Time    07/12/13  6:30 AM      Result Value Range   Cholesterol 200  0 - 200 mg/dL   Triglycerides 098  <119 mg/dL  HDL 41  >39 mg/dL   Total CHOL/HDL Ratio 4.9     VLDL 21  0 - 40 mg/dL   LDL Cholesterol 161 (*) 0 - 99 mg/dL  HEMOGLOBIN W9U     Status: Abnormal   Collection Time    07/12/13  6:30 AM      Result Value Range   Hemoglobin A1C 6.5 (*) <5.7 %   Mean Plasma Glucose 140 (*) <117 mg/dL   Mr Maxine Glenn Head Wo Contrast  07/11/2013   *RADIOLOGY REPORT*  Clinical Data:  Left leg weakness and left upper extremity numbness.  MRI HEAD WITHOUT CONTRAST MRA HEAD WITHOUT CONTRAST   Technique:  Multiplanar, multiecho pulse sequences of the brain and surrounding structures were obtained without intravenous contrast. Angiographic images of the head were obtained using MRA technique without contrast.  Comparison:  Head CT 07/10/2013  MRI HEAD  Findings:  There are numerous punctate foci of acute /subacute infarction.  There is a punctate focus in the right thalamus. There is a punctate focus in the right medial parietal occipital parietal junction cortex.  There are numerous punctate foci in the right parietal region along the margin of the right anterior and middle cerebral artery junction.  No large confluent infarction. No swelling or hemorrhage.  There are chronic small vessel changes affecting the pons.  There are a few old small vessel cerebellar infarctions.  There are a few old small vessel infarctions in the thalami and there is moderate chronic small vessel disease affecting the cerebral hemispheric deep and subcortical white matter.  There is old infarction in the right basal ganglia/external capsule region.  No mass lesion, hemorrhage, hydrocephalus or extra-axial collection.  No pituitary mass.  No inflammatory sinus disease.  No skull or skull base lesion.  IMPRESSION: Numerous punctate foci of acute infarction in the right hemisphere as outlined above.  This includes involvement of the right thalamus, occipital parietal junction region, and parietal lobe along the junction of the anterior and middle cerebral territories. Findings could be due to watershed infarction or micro emboli.  No mass effect or hemorrhage.  Moderate chronic small vessel ischemic changes elsewhere throughout the brain as outlined above.  MRA HEAD  Findings: Both internal carotid arteries are patent into the brain. There is narrowing and irregularity in both carotid siphon regions, right more than left.  Narrowing of the right carotid siphon region is estimated at 50-70%.  There is flow within both anterior and  middle cerebral arteries, but the vessels show narrowing and irregularity throughout consistent with diffuse intracranial atherosclerotic disease.  Both vertebral arteries are patent to the basilar with the left being dominant.  No basilar stenosis.  There is irregularity of the basilar artery consistent with atherosclerotic disease.  Superior cerebellar and posterior cerebral arteries are patent, but show considerable atherosclerotic narrowing with multiple stenoses.  IMPRESSION: No major vessel occlusion.  Severe widespread medium to small vessel atherosclerotic disease throughout all the major intracranial vascular territories with multiple areas of narrowing and irregularity.   Original Report Authenticated By: Paulina Fusi, M.D.   Mr Brain Wo Contrast  07/11/2013   *RADIOLOGY REPORT*  Clinical Data:  Left leg weakness and left upper extremity numbness.  MRI HEAD WITHOUT CONTRAST MRA HEAD WITHOUT CONTRAST  Technique:  Multiplanar, multiecho pulse sequences of the brain and surrounding structures were obtained without intravenous contrast. Angiographic images of the head were obtained using MRA technique without contrast.  Comparison:  Head CT 07/10/2013  MRI HEAD  Findings:  There are numerous punctate foci of acute /subacute infarction.  There is a punctate focus in the right thalamus. There is a punctate focus in the right medial parietal occipital parietal junction cortex.  There are numerous punctate foci in the right parietal region along the margin of the right anterior and middle cerebral artery junction.  No large confluent infarction. No swelling or hemorrhage.  There are chronic small vessel changes affecting the pons.  There are a few old small vessel cerebellar infarctions.  There are a few old small vessel infarctions in the thalami and there is moderate chronic small vessel disease affecting the cerebral hemispheric deep and subcortical white matter.  There is old infarction in the right basal  ganglia/external capsule region.  No mass lesion, hemorrhage, hydrocephalus or extra-axial collection.  No pituitary mass.  No inflammatory sinus disease.  No skull or skull base lesion.  IMPRESSION: Numerous punctate foci of acute infarction in the right hemisphere as outlined above.  This includes involvement of the right thalamus, occipital parietal junction region, and parietal lobe along the junction of the anterior and middle cerebral territories. Findings could be due to watershed infarction or micro emboli.  No mass effect or hemorrhage.  Moderate chronic small vessel ischemic changes elsewhere throughout the brain as outlined above.  MRA HEAD  Findings: Both internal carotid arteries are patent into the brain. There is narrowing and irregularity in both carotid siphon regions, right more than left.  Narrowing of the right carotid siphon region is estimated at 50-70%.  There is flow within both anterior and middle cerebral arteries, but the vessels show narrowing and irregularity throughout consistent with diffuse intracranial atherosclerotic disease.  Both vertebral arteries are patent to the basilar with the left being dominant.  No basilar stenosis.  There is irregularity of the basilar artery consistent with atherosclerotic disease.  Superior cerebellar and posterior cerebral arteries are patent, but show considerable atherosclerotic narrowing with multiple stenoses.  IMPRESSION: No major vessel occlusion.  Severe widespread medium to small vessel atherosclerotic disease throughout all the major intracranial vascular territories with multiple areas of narrowing and irregularity.   Original Report Authenticated By: Paulina Fusi, M.D.    Assessment/Plan: Diagnosis: multiple, likely embolic, right sided infarcts (parietal, occipital, thalamic) 1. Does the need for close, 24 hr/day medical supervision in concert with the patient's rehab needs make it unreasonable for this patient to be served in a less  intensive setting? Yes 2. Co-Morbidities requiring supervision/potential complications: htn 3. Due to bladder management, bowel management, safety, skin/wound care, disease management and medication administration, does the patient require 24 hr/day rehab nursing? Yes 4. Does the patient require coordinated care of a physician, rehab nurse, PT (1-2 hrs/day, 5 days/week) and OT (1-2 hrs/day, 5 days/week) to address physical and functional deficits in the context of the above medical diagnosis(es)? Yes Addressing deficits in the following areas: balance, endurance, locomotion, strength, transferring, bowel/bladder control, bathing, dressing, feeding, grooming, toileting and psychosocial support 5. Can the patient actively participate in an intensive therapy program of at least 3 hrs of therapy per day at least 5 days per week? Yes 6. The potential for patient to make measurable gains while on inpatient rehab is excellent 7. Anticipated functional outcomes upon discharge from inpatient rehab are mod I to supervision with PT, mod I to supervision with OT, n/a with SLP. 8. Estimated rehab length of stay to reach the above functional goals is: 2 weeks 9. Does the patient have adequate social supports to accommodate  these discharge functional goals? Yes 10. Anticipated D/C setting: Home 11. Anticipated post D/C treatments: HH therapy 12. Overall Rehab/Functional Prognosis: excellent  RECOMMENDATIONS: This patient's condition is appropriate for continued rehabilitative care in the following setting: CIR Patient has agreed to participate in recommended program. Yes Note that insurance prior authorization may be required for reimbursement for recommended care.  Comment: Rehab RN to follow up.   Ranelle Oyster, MD, [place] [facility]     07/13/2013

## 2013-07-14 ENCOUNTER — Inpatient Hospital Stay (HOSPITAL_COMMUNITY)
Admission: RE | Admit: 2013-07-14 | Discharge: 2013-07-21 | DRG: 945 | Disposition: A | Discharge status: Home Health | Payer: Medicare Other | Source: Intra-hospital | Attending: Physical Medicine & Rehabilitation | Admitting: Physical Medicine & Rehabilitation

## 2013-07-14 ENCOUNTER — Encounter (HOSPITAL_COMMUNITY)
Admission: EM | Disposition: A | Discharge status: Rehabilitation Facility | Payer: Self-pay | Source: Home / Self Care | Attending: Internal Medicine

## 2013-07-14 ENCOUNTER — Encounter (HOSPITAL_COMMUNITY): Payer: Self-pay | Admitting: *Deleted

## 2013-07-14 ENCOUNTER — Encounter (INDEPENDENT_AMBULATORY_CARE_PROVIDER_SITE_OTHER): Payer: Medicare Other | Admitting: General Surgery

## 2013-07-14 DIAGNOSIS — I6340 Cerebral infarction due to embolism of unspecified cerebral artery: Secondary | ICD-10-CM

## 2013-07-14 DIAGNOSIS — Z5189 Encounter for other specified aftercare: Principal | ICD-10-CM

## 2013-07-14 DIAGNOSIS — I639 Cerebral infarction, unspecified: Secondary | ICD-10-CM

## 2013-07-14 DIAGNOSIS — J310 Chronic rhinitis: Secondary | ICD-10-CM

## 2013-07-14 DIAGNOSIS — J309 Allergic rhinitis, unspecified: Secondary | ICD-10-CM

## 2013-07-14 DIAGNOSIS — F3289 Other specified depressive episodes: Secondary | ICD-10-CM

## 2013-07-14 DIAGNOSIS — Z7902 Long term (current) use of antithrombotics/antiplatelets: Secondary | ICD-10-CM

## 2013-07-14 DIAGNOSIS — I10 Essential (primary) hypertension: Secondary | ICD-10-CM

## 2013-07-14 DIAGNOSIS — Z79899 Other long term (current) drug therapy: Secondary | ICD-10-CM

## 2013-07-14 DIAGNOSIS — R29898 Other symptoms and signs involving the musculoskeletal system: Secondary | ICD-10-CM

## 2013-07-14 DIAGNOSIS — F17200 Nicotine dependence, unspecified, uncomplicated: Secondary | ICD-10-CM

## 2013-07-14 DIAGNOSIS — E669 Obesity, unspecified: Secondary | ICD-10-CM

## 2013-07-14 DIAGNOSIS — Z8042 Family history of malignant neoplasm of prostate: Secondary | ICD-10-CM

## 2013-07-14 DIAGNOSIS — Z803 Family history of malignant neoplasm of breast: Secondary | ICD-10-CM

## 2013-07-14 DIAGNOSIS — F329 Major depressive disorder, single episode, unspecified: Secondary | ICD-10-CM

## 2013-07-14 DIAGNOSIS — F411 Generalized anxiety disorder: Secondary | ICD-10-CM

## 2013-07-14 DIAGNOSIS — K219 Gastro-esophageal reflux disease without esophagitis: Secondary | ICD-10-CM

## 2013-07-14 DIAGNOSIS — E785 Hyperlipidemia, unspecified: Secondary | ICD-10-CM

## 2013-07-14 DIAGNOSIS — Z8673 Personal history of transient ischemic attack (TIA), and cerebral infarction without residual deficits: Secondary | ICD-10-CM

## 2013-07-14 DIAGNOSIS — Z9089 Acquired absence of other organs: Secondary | ICD-10-CM

## 2013-07-14 SURGERY — ECHOCARDIOGRAM, TRANSESOPHAGEAL
Anesthesia: Moderate Sedation

## 2013-07-14 MED ORDER — PREDNISOLONE ACETATE 1 % OP SUSP
1.0000 [drp] | Freq: Four times a day (QID) | OPHTHALMIC | Status: DC
  Administered 2013-07-14 – 2013-07-21 (×27): 1 [drp] via OPHTHALMIC
  Filled 2013-07-14: qty 1

## 2013-07-14 MED ORDER — SORBITOL 70 % SOLN: 30.0000 mL | Freq: Every day | Status: DC | PRN

## 2013-07-14 MED ORDER — ONDANSETRON HCL 4 MG/2ML IJ SOLN: 4.0000 mg | Freq: Four times a day (QID) | INTRAMUSCULAR | Status: DC | PRN

## 2013-07-14 MED ORDER — ONDANSETRON HCL 4 MG PO TABS: 4.0000 mg | ORAL_TABLET | Freq: Four times a day (QID) | ORAL | Status: DC | PRN

## 2013-07-14 MED ORDER — CLOPIDOGREL BISULFATE 75 MG PO TABS: 75.0000 mg | ORAL_TABLET | Freq: Every day | ORAL | Status: DC

## 2013-07-14 MED ORDER — GATIFLOXACIN 0.5 % OP SOLN
1.0000 [drp] | Freq: Four times a day (QID) | OPHTHALMIC | Status: DC
  Administered 2013-07-14 – 2013-07-21 (×27): 1 [drp] via OPHTHALMIC
  Filled 2013-07-14: qty 2.5

## 2013-07-14 MED ORDER — CLOPIDOGREL BISULFATE 75 MG PO TABS
75.0000 mg | ORAL_TABLET | Freq: Every day | ORAL | Status: DC
  Administered 2013-07-15 – 2013-07-21 (×7): 75 mg via ORAL
  Filled 2013-07-14 (×8): qty 1

## 2013-07-14 MED ORDER — ACETAMINOPHEN 325 MG PO TABS
325.0000 mg | ORAL_TABLET | ORAL | Status: DC | PRN
  Administered 2013-07-16 – 2013-07-19 (×2): 650 mg via ORAL
  Filled 2013-07-14 (×2): qty 2

## 2013-07-14 MED ORDER — IRBESARTAN 150 MG PO TABS
150.0000 mg | ORAL_TABLET | Freq: Every day | ORAL | Status: DC
  Administered 2013-07-15 – 2013-07-21 (×7): 150 mg via ORAL
  Filled 2013-07-14 (×8): qty 1

## 2013-07-14 MED ORDER — PANTOPRAZOLE SODIUM 40 MG PO TBEC
40.0000 mg | DELAYED_RELEASE_TABLET | Freq: Every day | ORAL | Status: DC
  Administered 2013-07-15 – 2013-07-21 (×7): 40 mg via ORAL
  Filled 2013-07-14 (×6): qty 1

## 2013-07-14 MED ORDER — ALPRAZOLAM 0.25 MG PO TABS
0.2500 mg | ORAL_TABLET | Freq: Three times a day (TID) | ORAL | Status: DC | PRN
  Administered 2013-07-14 – 2013-07-17 (×3): 0.25 mg via ORAL
  Filled 2013-07-14 (×3): qty 1

## 2013-07-14 MED ORDER — ATORVASTATIN CALCIUM 10 MG PO TABS: 10.0000 mg | ORAL_TABLET | Freq: Every day | ORAL | Status: DC

## 2013-07-14 MED ORDER — LORATADINE 10 MG PO TABS
10.0000 mg | ORAL_TABLET | Freq: Every day | ORAL | Status: DC | PRN
  Filled 2013-07-14: qty 1

## 2013-07-14 MED ORDER — ALUM & MAG HYDROXIDE-SIMETH 200-200-20 MG/5ML PO SUSP: 15.0000 mL | Freq: Four times a day (QID) | ORAL | Status: DC | PRN

## 2013-07-14 MED ORDER — ATORVASTATIN CALCIUM 10 MG PO TABS
10.0000 mg | ORAL_TABLET | Freq: Every day | ORAL | Status: DC
  Administered 2013-07-14 – 2013-07-20 (×7): 10 mg via ORAL
  Filled 2013-07-14 (×8): qty 1

## 2013-07-14 MED ORDER — KETOROLAC TROMETHAMINE 0.5 % OP SOLN
1.0000 [drp] | Freq: Four times a day (QID) | OPHTHALMIC | Status: DC
  Administered 2013-07-14 – 2013-07-21 (×27): 1 [drp] via OPHTHALMIC
  Filled 2013-07-14: qty 3

## 2013-07-14 NOTE — Progress Notes (Signed)
Physical Therapy Treatment Patient Details Name: Deborah Whitehead MRN: [id number] DOB: 1951/10/12 Today's Date: 07/14/2013 Time: 0865-7846 PT Time Calculation (min): 30 min  PT Assessment / Plan / Recommendation  History of Present Illness 12 y o female with 2 weeks hx of left lower extremity weakness and left upper extremity paresthesia.      PT Comments   Progressing nicely today. Started balance and reaching activities today. Still anxious and preoccupied with not having the test done although she already knew it had been cancelled.  Follow Up Recommendations  CIR     Does the patient have the potential to tolerate intense rehabilitation     Barriers to Discharge        Equipment Recommendations  Rolling walker with 5" wheels;3in1 (PT)    Recommendations for Other Services Rehab consult  Frequency Min 4X/week   Progress towards PT Goals Progress towards PT goals: Progressing toward goals  Plan Current plan remains appropriate    Precautions / Restrictions Precautions Precautions: Fall Restrictions Weight Bearing Restrictions: No   Pertinent Vitals/Pain Denies pain    Mobility  Bed Mobility Bed Mobility: Not assessed Transfers Transfers: Sit to Stand;Stand to Sit Sit to Stand: 5: Supervision;With upper extremity assist;From chair/3-in-1 Stand to Sit: 5: Supervision;With upper extremity assist;To chair/3-in-1 Details for Transfer Assistance: verbal cues for safe sequencing using RW and hand placement Ambulation/Gait Ambulation/Gait Assistance: 4: Min guard;4: Min assist Ambulation Distance (Feet): 120 Feet Assistive device: Rolling walker Ambulation/Gait Assistance Details: cues for tall posture and focus on control of LLE during gait, LLE tends to drag slightly however increases more with fatigue Gait Pattern: Step-through pattern;Trunk flexed;Decreased stride length Stairs: No Modified Rankin (Stroke Patients Only) Pre-Morbid Rankin Score: Moderate  disability Modified Rankin: Moderately severe disability    Exercises General Exercises - Lower Extremity Long Arc Quad: AROM;Both;10 reps;Seated Hip Flexion/Marching: AROM;Both;10 reps;Standing (with minA and supported at RW with BUE support) Toe Raises: AROM;Both;10 reps;Seated Shoulder Exercises Shoulder Flexion: AAROM;Left;10 reps;Standing    PT Goals (current goals can now be found in the care plan section)    Visit Information  Last PT Received On: 07/14/13 Assistance Needed: +1 History of Present Illness: 62 y o female with 2 weeks hx of left lower extremity weakness and left upper extremity paresthesia.    Subjective Data      Cognition  Cognition Arousal/Alertness: Awake/alert Behavior During Therapy: Anxious Overall Cognitive Status: Within Functional Limits for tasks assessed    Balance  Balance Balance Assessed: Yes Static Standing Balance Static Standing - Balance Support: No upper extremity supported Static Standing - Level of Assistance: 5: Stand by assistance Static Standing - Comment/# of Minutes: stood x2 minutes without upper extremity support, needs close gaurding due to increased lateral sway Dynamic Standing Balance Dynamic Standing - Balance Support: Right upper extremity supported Dynamic Standing - Level of Assistance: 4: Min assist Dynamic Standing - Balance Activities: Reaching for objects;Reaching across midline;Lateral lean/weight shifting Dynamic Standing - Comments: LUE reaching activities outside BOS to the left and accross midline x5 minutes with facilitation for weight shift and lateral trunk flexion on the right  End of Session PT - End of Session Equipment Utilized During Treatment: Gait belt Activity Tolerance: Patient tolerated treatment well Patient left: in chair;with call bell/phone within reach;with chair alarm set Nurse Communication: Mobility status   GP     [facility] Deborah Whitehead 07/14/2013, 11:01 AM

## 2013-07-14 NOTE — Progress Notes (Signed)
Rehab admissions - Evaluated for possible admission.  I spoke with patient and her daughter yesterday.  Dtr works and may not be able to take time off.  Dtr will look into having a nephew sit with patient if needed after rehab.  Patient is hopeful that she can go home from rehab at [facility] I level.  Patient refusing TEE.  Bed available and can admit to inpatient rehab today.  Call me for questions.  #[phone]

## 2013-07-14 NOTE — Progress Notes (Signed)
Dr. Benjamine Mola has discussed the TEE with the patient and the patient continues to refuse.  Dr. Benjamine Mola notified; new orders received to discontinue TEE and place diet order.  Will continue to monitor.

## 2013-07-14 NOTE — Progress Notes (Signed)
Notified Dr. Jens Som of patient's refusal of TEE and the order to discontinue TEE.  No new orders received.  Will continue to monitor.

## 2013-07-14 NOTE — Progress Notes (Signed)
Annie Main, RN notified of patient's refusal of TEE and order to discontinue TEE by Dr. Benjamine Mola.  Will continue to monitor.

## 2013-07-14 NOTE — Progress Notes (Signed)
Pt arrived to unit at 1530. Rehab packet given to pt and reviewed process. Safety plan sheet reviewed and signed with understanding. Pt in bed resting with no complaints of pain. Call bell with in reach and bed alarm on. Daughter is on the way.

## 2013-07-14 NOTE — Plan of Care (Signed)
Overall Plan of Care [facility]) Patient Details Name: Deborah Whitehead MRN: [id number] DOB: 05-Sep-1951  Diagnosis:  Embolic infarct L parieto-occipital and thalamic  Co-morbidities: Anxiety  .  Arthritis  .  Substance abuse  .  Hypertension  dr Pecola Leisure immanuel fp  .  Shortness of breath  .  GERD (gastroesophageal reflux disease)  .  Depression  .  Fibroids    Functional Problem List  Patient demonstrates impairments in the following areas: Balance, Endurance, Medication Management, Motor, Perception, Safety and Sensory   Basic ADL's: grooming, bathing, dressing and toileting Advanced ADL's: simple meal preparation and light housekeeping  Transfers:  bed mobility, bed to chair, toilet, tub/shower, car and furniture Locomotion:  ambulation, wheelchair mobility and stairs  Additional Impairments:  Functional use of upper extremity  Anticipated Outcomes Item Anticipated Outcome  Eating/Swallowing    Basic self-care  Mod I with AE PRN  Tolieting  Mod I with AE PRN  Bowel/Bladder  Remain continent of bowel and bladder.   Transfers  Modified independent basic, supervision car * tub/shower  Locomotion  Modified independent gait x 150' controlled setting, x 50' home setting, and up/down 12 steps 1 rail supervision  Communication    Cognition    Pain  3 or less  Safety/Judgment    Other     Therapy Plan: PT Intensity: Minimum of 1-2 x/day ,45 to 90 minutes PT Frequency: 5 out of 7 days PT Duration Estimated Length of Stay: 5-7 days OT Intensity: Minimum of 1-2 x/day, 45 to 90 minutes OT Frequency: 5 out of 7 days OT Duration/Estimated Length of Stay: 7 days      Team Interventions: Item RN PT OT SLP SW TR Other  Self Care/Advanced ADL Retraining   x      Neuromuscular Re-Education  x x      Therapeutic Activities  x x      UE/LE Strength Training/ROM  x x      UE/LE Coordination Activities  x x      Visual/Perceptual Remediation/Compensation          DME/Adaptive Equipment Instruction  x x      Therapeutic Exercise  x x      Balance/Vestibular Training  x x      Patient/Family Education x x x      Cognitive Remediation/Compensation         Functional Mobility Training  x x      Ambulation/Gait Training  x       Museum/gallery curator  x       Wheelchair Propulsion/Positioning  x       Functional Tourist information centre manager Reintegration   x      Dysphagia/Aspiration Landscape architect Facilitation         Bladder Management         Bowel Management         Disease Management/Prevention x        Pain Management x        Medication Management x        Skin Care/Wound Management         Splinting/Orthotics  x       Discharge Planning  x x  x    Psychosocial Support  x x  x  Team Discharge Planning: Destination: PT-Home ,OT- Home , SLP-  Projected Follow-up: PT-Home health PT, OT-  Home health OT (intermittent Supervision), SLP-  Projected Equipment Needs: PT- , OT-  , SLP-  Patient/family involved in discharge planning: PT- Patient,  OT-Patient, SLP-   MD ELOS: 7-10 days Medical Rehab Prognosis:  Excellent Assessment: 62 yo female admitted with R hemiparesis, MRI revealed multile L cortical adn subcortical infarcts. Now requiring 24/7 Rehab RN,MD, as well as CIR level PT, OT and SLP.  Treatment team will focus on ADLs and mobility with goals set at supervision to Mod I    See Team Conference Notes for weekly updates to the plan of care
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
## 2013-07-14 NOTE — Progress Notes (Signed)
Patient is being discharged room 4N17 and transferred to CIR at this time. Report given to receiving nurse. No IV access and tele d/c'd. Discharge instructions read to patient and understanding verbalized.Patient in stable condition at this time.

## 2013-07-14 NOTE — PMR Pre-admission (Signed)
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 Date: 07/14/13 eligible     Deduct:        Out of Pocket Max:        Life Max:   CIR:        SNF:   Outpatient:       Co-Pay:   Home Health:        Co-Pay:   DME:       Co-Pay:    Emergency Contact Information Contact Information   Name Relation Home Work Mobile   [place] Daughter [phone]      Current Medical History  Patient Admitting Diagnosis: Multiple, likely embolic, right sided infarcts (parietal, occipital, thalamic)   History of Present Illness:  A 62 y.o. right-handed female with history of hypertension as well as history of CVA. Patient independent prior to admission and driving. Presented 07/11/2013 with left-sided weakness x2 weeks. MRI of the brain showed numerous punctate foci of acute infarction in the right hemisphere of the right thalamus, occipital parietal junction region and parietal lobe along the junction of the anterior and middle cerebral territories. MRA of the head with  no major vessel occlusion. Echocardiogram done. Carotid Dopplers with less than 40% ICA stenosis. Patient did not receive TPA. Neurology services consulted and placed on Plavix for CVA prophylaxis. Patient had been on aspirin prior to admission. Patient is refusing to have TEE done.  Patient tolerating a regular diet. Physical and occupational therapy evaluations completed 07/12/2013 with recommendations for physical medicine rehabilitation consult to consider inpatient rehabilitation services.      Total: 1=NIH  Past Medical History  Past Medical History  Diagnosis Date  . Anxiety   . Arthritis   . Substance abuse   . Hypertension     dr Pecola Leisure     [facility] fp  . Shortness of breath   . GERD (gastroesophageal reflux disease)   . Depression   . Fibroids  abd  . Stroke     memory loss    Family History  family history includes Cancer in her father and mother.  Prior Rehab/Hospitalizations:  No previous rehab admissions.   Current Medications  Current facility-administered medications:ALPRAZolam Prudy Feeler) tablet 0.25 mg, 0.25 mg, Oral, TID PRN, Houston Siren, MD;  alum & mag hydroxide-simeth (MAALOX/MYLANTA) 200-200-20 MG/5ML suspension 15 mL, 15 mL, Oral, Q6H PRN, Joseph Art, DO;  atorvastatin (LIPITOR) tablet 10 mg, 10 mg, Oral, q1800, Joseph Art, DO, 10 mg at 07/13/13 1745;  clopidogrel (PLAVIX) tablet 75 mg, 75 mg, Oral, Q breakfast, Joseph Art, DO, 75 mg at 07/14/13 0814 gatifloxacin (ZYMAXID) 0.5 % ophthalmic drops 1 drop, 1 drop, Right Eye, QID, Houston Siren, MD, 1 drop at 07/14/13 1049;  irbesartan (AVAPRO) tablet 150 mg, 150 mg, Oral, Daily, Jessica U Vann, DO, 150 mg at 07/14/13 1048;  ketorolac (ACULAR) 0.5 % ophthalmic solution 1 drop, 1 drop, Right Eye, QID, Hessie Diener [place], RPH, 1 drop at 07/14/13 1049;  loratadine (CLARITIN) tablet 10 mg, 10 mg, Oral, Daily PRN, Houston Siren, MD LORazepam (ATIVAN) injection 1 mg, 1 mg, Intravenous, Once, Jessica U Vann, DO;  pantoprazole  (PROTONIX) EC tablet 40 mg, 40 mg, Oral, Daily, Jessica U Vann, DO, 40 mg at 07/14/13 1048;  prednisoLONE acetate (PRED FORTE) 1 % ophthalmic suspension 1 drop, 1 drop, Right Eye, QID, Houston Siren, MD, 1 drop at 07/14/13 1049;  sodium chloride 0.9 % injection 3 mL, 3 mL, Intravenous, Q12H, Houston Siren, MD, 3 mL at 07/11/13 2125  Patients Current Diet: Cardiac  Precautions / Restrictions Precautions Precautions: Fall Restrictions Weight Bearing Restrictions: No   Prior Activity Level Community (5-7x/wk): Went out most days.  Might stay home on the weekends.  Home Assistive Devices / Equipment Home Assistive Devices/Equipment: None Home Equipment: Walker - 2 wheels  Prior Functional Level Prior Function Level of Independence: Independent Comments: Independent prior to most recent admission earlier this month  Current Functional Level Cognition  Overall Cognitive Status: Within Functional Limits for tasks assessed Orientation Level: Oriented X4    Extremity Assessment (includes Sensation/Coordination)  Upper Extremity Assessment: RUE deficits/detail;LUE deficits/detail;Generalized weakness RUE Deficits / Details: 3+/5 throughout. Pt states RUE has been weak since stroke in 2010 but feels generally weak now due to deconditioning. RUE Sensation: decreased light touch RUE Coordination: decreased fine motor;decreased gross motor LUE Deficits / Details: 3+/5 throughout LUE Sensation: decreased light touch LUE Coordination: decreased fine motor;decreased gross motor  Lower Extremity Assessment: LLE deficits/detail LLE Deficits / Details: Hip flexion, 3/5; Knee ext, 3+/5, ankle dorsiflexion, 1/5 LLE Coordination: decreased fine motor;decreased gross motor    ADLs  Eating/Feeding: Performed;Modified independent Where Assessed - Eating/Feeding: Chair Grooming: Wash/dry hands;Min guard;Performed Where Assessed - Grooming: Supported standing Upper Body Bathing: Simulated;Set up Where  Assessed - Upper Body Bathing: Unsupported sitting Lower Body Bathing: Simulated;Min guard Where Assessed - Lower Body Bathing: Supported sit to stand Upper Body Dressing: Simulated;Minimal assistance Where Assessed - Upper Body Dressing: Unsupported sitting Lower Body Dressing: Minimal assistance Where Assessed - Lower Body Dressing: Supported sit to stand Toilet Transfer: Performed;Minimal assistance Toilet Transfer Method: Sit to Barista: Comfort height toilet;Grab bars Toileting - Clothing Manipulation and Hygiene: Minimal assistance;Performed Where Assessed - Engineer, mining and Hygiene: Sit to stand from 3-in-1 or toilet Equipment Used: Gait belt;Rolling walker Transfers/Ambulation Related to ADLs: min guard with RW, min vc's for safe hand placement during transitions and ambulation ADL Comments: Pt ambulated from  chair to bathroom. Pt performed toilet transfer wih vc's for sequencing and safe transfer techniques. Pt able to perform peri care, and then Pt performed BUE grooming tasks at sink. Pt is in need og guarding for safety, and Pt needed one arm supporting her while standing at sink for ADL.    Mobility  Bed Mobility: Not assessed    Transfers  Transfers: Sit to Stand;Stand to Sit Sit to Stand: 5: Supervision;With upper extremity assist;From chair/3-in-1 Stand to Sit: 5: Supervision;With upper extremity assist;To chair/3-in-1    Ambulation / Gait / Stairs / Wheelchair Mobility  Ambulation/Gait Ambulation/Gait Assistance: 4: Min guard;4: Min Environmental consultant (Feet): 120 Feet Assistive device: Rolling walker Ambulation/Gait Assistance Details: cues for tall posture and focus on control of LLE during gait, LLE tends to drag slightly however increases more with fatigue Gait Pattern: Step-through pattern;Trunk flexed;Decreased stride length Stairs: No Stairs Assistance: 3: Mod assist Stairs Assistance Details (indicate cue type  and reason): Cues for sequence; noted L foot catch on step Stair Management Technique: One rail Left;Step to pattern;Sideways Number of Stairs: 3    Posture / Balance Static Standing Balance Static Standing - Balance Support: No upper extremity supported Static Standing - Level of Assistance: 5: Stand by assistance Static Standing - Comment/# of Minutes: stood x2 minutes without upper extremity support, needs close gaurding due to increased lateral sway Dynamic Standing Balance Dynamic Standing - Balance Support: Right upper extremity supported Dynamic Standing - Level of Assistance: 4: Min assist Dynamic Standing - Balance Activities: Reaching for objects;Reaching across midline;Lateral lean/weight shifting Dynamic Standing - Comments: LUE reaching activities outside BOS to the left and accross midline x5 minutes with facilitation for weight shift and lateral trunk flexion on the right Berg Balance Test Sit to Stand: Able to stand using hands after several tries Standing Unsupported: Able to stand 2 minutes with supervision Sitting with Back Unsupported but Feet Supported on Floor or Stool: Able to sit safely and securely 2 minutes Stand to Sit: Controls descent by using hands Transfers: Able to transfer with verbal cueing and /or supervision Standing Unsupported with Eyes Closed: Able to stand 10 seconds with supervision Standing Ubsupported with Feet Together: Able to place feet together independently but unable to hold for 30 seconds From Standing, Reach Forward with Outstretched Arm: Can reach forward >12 cm safely (5") From Standing Position, Pick up Object from Floor: Unable to pick up shoe, but reaches 2-5 cm (1-2") from shoe and balances independently From Standing Position, Turn to Look Behind Over each Shoulder: Turn sideways only but maintains balance Turn 360 Degrees: Able to turn 360 degrees safely but slowly Standing Unsupported, Alternately Place Feet on Step/Stool: Able to  complete >2 steps/needs minimal assist Standing Unsupported, One Foot in Front: Able to take small step independently and hold 30 seconds Standing on One Leg: Tries to lift leg/unable to hold 3 seconds but remains standing independently Total Score: 32    Special needs/care consideration BiPAP/CPAP No CPM No Continuous Drip IV No Dialysis No       Life Vest No Oxygen No Special Bed No Trach Size No Wound Vac (area) No       Skin No                            Bowel mgmt: Had BM today 07/14/13 Bladder mgmt: Voiding on BSC and in bathroom, some dribbling. Diabetic mgmt No    Previous Home Environment Living  Arrangements: Children Available Help at Discharge: Family;Available PRN/intermittently Type of Home: Apartment Home Layout: One level Home Access: Stairs to enter Entrance Stairs-Rails: Doctor, general practice of Steps: 10 Bathroom Shower/Tub: Engineer, manufacturing systems: Standard Home Care Services: No  Discharge Living Setting Plans for Discharge Living Setting: Patient's home;Lives with (comment);Apartment (Lives with daughter.) Type of Home at Discharge: Apartment Discharge Home Layout: One level (Apartment is on the 2nd level.) Discharge Home Access: Stairs to enter Entrance Stairs-Number of Steps: 10 Does the patient have any problems obtaining your medications?: No  Social/Family/Support Systems Patient Roles: Parent (Lives with Dtr.  Son in [place].) Contact Information: Marjory Sneddon - Dtr Anticipated Caregiver: self and daughter Anticipated Caregiver's Contact Information: Yvone Neu [phone] Ability/Limitations of Caregiver: Dtr works 5 a to 1 p at [employer] of [place].  May be able to get a nephew to stay with patient after discharge while dtr works. Caregiver Availability: Other (Comment) (Alone 5 am to 1 pm unless nephew can stay.) Discharge Plan Discussed with Primary Caregiver: Yes Is Caregiver In Agreement with Plan?: Yes Does  Caregiver/Family have Issues with Lodging/Transportation while Pt is in Rehab?: No  Goals/Additional Needs Patient/Family Goal for Rehab: PT/OT mod I/Supervision goals, no ST needs.  Patient hopeful for mod I so that she can go home alone. Expected length of stay: 2 weeks Cultural Considerations: Baptist, attends church. Dietary Needs: Heart, thin liquids Equipment Needs: TBD Additional Information: Has been disabled since CVA in 2010 Pt/Family Agrees to Admission and willing to participate: Yes Program Orientation Provided & Reviewed with Pt/Caregiver Including Roles  & Responsibilities: Yes  Decrease burden of Care through IP rehab admission: Not applicable  Possible need for SNF placement upon discharge: Not likely  Patient Condition: This patient's condition remains as documented in the consult dated 07/13/13, in which the Rehabilitation Physician determined and documented that the patient's condition is appropriate for intensive rehabilitative care in an inpatient rehabilitation facility. Will admit to inpatient rehab today.  Preadmission Screen Completed By:  Trish Mage, 07/14/2013 11:38 AM ______________________________________________________________________   Discussed status with Dr. Riley Kill on 07/14/13 at 1145 and received telephone approval for admission today.  Admission Coordinator:  Trish Mage, time1145/Date07/29/14

## 2013-07-14 NOTE — Progress Notes (Signed)
Stroke Team Progress Note  HISTORY Deborah Whitehead is an 62 y.o. female history of hypertension, presenting with weakness involving left lower extremity and numbness involving left upper extremity with onset about 2 weeks ago. She's noticed changes and ambulation with left lower extremity weakness. Numbness has persisted and left upper extremity. She's had no changes in speech. CT scan of her head showed no acute intracranial abnormality. Several old lacunar infarcts in the right occipital region were noted. MRI study however showed numerous punctate foci of acute infarction involving the right hemisphere, including right thalamus, occipital parietal junction, parietal lobe and a watershed area between the middle and anterior cerebral artery territories. NIH stroke score was 3. Patient was not a TPA candidate secondary to delay in arrival. She was admitted for further evaluation and treatment.  SUBJECTIVE No family is at the bedside.  She is sitting up in the geri chair. She remains tearful. She has refused her TEE.  OBJECTIVE Most recent Vital Signs: Filed Vitals:   07/14/13 0139 07/14/13 0541 07/14/13 0928 07/14/13 1030  BP: 135/86 136/79 126/83   Pulse: 81 80 86 99  Temp: 97.9 F (36.6 C) 98.4 F (36.9 C) 98.2 F (36.8 C)   TempSrc: Oral Oral Oral   Resp: 18 18 18    Height:      Weight:      SpO2: 99% 98% 99%    CBG (last 3)  No results found for this basename: GLUCAP,  in the last 72 hours  IV Fluid Intake:     MEDICATIONS  . atorvastatin  10 mg Oral q1800  . clopidogrel  75 mg Oral Q breakfast  . gatifloxacin  1 drop Right Eye QID  . irbesartan  150 mg Oral Daily  . ketorolac  1 drop Right Eye QID  . LORazepam  1 mg Intravenous Once  . pantoprazole  40 mg Oral Daily  . prednisoLONE acetate  1 drop Right Eye QID  . sodium chloride  3 mL Intravenous Q12H   PRN:  ALPRAZolam, alum & mag hydroxide-simeth, loratadine  Diet:  Cardiac thin liquids Activity:  Bedrest with  Bathroom privileges DVT Prophylaxis:  SCDs   CLINICALLY SIGNIFICANT STUDIES Basic Metabolic Panel:   Recent Labs Lab 07/10/13 1621  NA 137  K 3.9  CL 98  CO2 31  GLUCOSE 75  BUN 16  CREATININE 1.10  CALCIUM 10.0   Liver Function Tests:   Recent Labs Lab 07/10/13 1621  AST 23  ALT 18  ALKPHOS 120*  BILITOT 0.2*  PROT 7.9  ALBUMIN 3.3*   CBC:   Recent Labs Lab 07/10/13 1621  WBC 6.8  NEUTROABS 3.7  HGB 11.8*  HCT 37.0  MCV 93.7  PLT 279   Coagulation:   Recent Labs Lab 07/10/13 1621  LABPROT 13.6  INR 1.06   Cardiac Enzymes:   Recent Labs Lab 07/10/13 1621  TROPONINI <0.30   Urinalysis:   Recent Labs Lab 07/10/13 1600  COLORURINE YELLOW  LABSPEC 1.020  PHURINE 7.5  GLUCOSEU NEGATIVE  HGBUR NEGATIVE  BILIRUBINUR NEGATIVE  KETONESUR NEGATIVE  PROTEINUR NEGATIVE  UROBILINOGEN 1.0  NITRITE NEGATIVE  LEUKOCYTESUR MODERATE*   Lipid Panel    Component Value Date/Time   CHOL 200 07/12/2013 0630   TRIG 105 07/12/2013 0630   HDL 41 07/12/2013 0630   CHOLHDL 4.9 07/12/2013 0630   VLDL 21 07/12/2013 0630   LDLCALC 138* 07/12/2013 0630   HgbA1C  Lab Results  Component Value Date   HGBA1C  6.5* 07/12/2013    Urine Drug Screen:     Component Value Date/Time   LABOPIA NONE DETECTED 07/10/2013 1600   COCAINSCRNUR NONE DETECTED 07/10/2013 1600   LABBENZ NONE DETECTED 07/10/2013 1600   AMPHETMU NONE DETECTED 07/10/2013 1600   THCU NONE DETECTED 07/10/2013 1600   LABBARB NONE DETECTED 07/10/2013 1600    Alcohol Level:   Recent Labs Lab 07/10/13 1621  ETH <11   CT of the brain    MRI of the brain  07/11/2013  Numerous punctate foci of acute infarction in the right hemisphere as outlined above.  This includes involvement of the right thalamus, occipital parietal junction region, and parietal lobe along the junction of the anterior and middle cerebral territories. Findings could be due to watershed infarction or micro emboli.  No mass effect or  hemorrhage.  Moderate chronic small vessel ischemic changes elsewhere throughout the brain  MRA of the brain 07/11/2013  No major vessel occlusion.  Severe widespread medium to small vessel atherosclerotic disease throughout all the major intracranial vascular territories with multiple areas of narrowing and irregularity.    2D Echocardiogram    Carotid Doppler  Bilateral: Less than 40% ICA stenosis. Vertebral artery flow is antegrade.   CXR    EKG  normal EKG, normal sinus rhythm.   Therapy Recommendations CIR  Physical Exam   Obese middle aged African american lady not in distress.Awake alert. Afebrile. Head is nontraumatic. Neck is supple without bruit. Hearing is normal. Cardiac exam no murmur or gallop. Lungs are clear to auscultation. Distal pulses are well felt. Neurological Exam ; Awake alert oriented x 3 normal speech and language. Mild left lower face asymmetry. Tongue midline. No drift. Mild diminished fine finger movements on left. Orbits right over left upper extremity. Mild left grip weak.. Mild diminished left legl sensation . Normal coordination.  ASSESSMENT [name] is a 62 y.o. female presenting with left lower extremity weakness and numbness. Imaging confirms multiple right MCA/ACA territory infarcts. Infarcts felt to be embolic secondary to unknown etiology.  On aspirin 81 mg orally every day prior to admission, though she was not taking it regularly. Now on clopidogrel 75 mg orally every day for secondary stroke prevention. Patient with resultant left hemiplegia. Work up completed.   Hx substance abuse, screen neg, hx cocaine use Hypertension Hyperlipidemia, LDL 138, on no statin PTA, now on lipitor 10, goal LDL < 100 (< 70 for diabetics) Hx stroke per record,date and location unknown,  with resultant memory loss Cigarette smoker Obesity, Body mass index is 39.41 kg/(m^2).  HgbA1c 6.5  Hospital day # 4  TREATMENT/PLAN  Continue clopidogrel 75 mg  orally every day for secondary stroke prevention.  Agree with plans for discharge to IP rehab when bed available. Patient will need outpatient prolonged cardiac telemetry x 3 weeks post discharge.Patient has a 10-15% risk of having another stroke over the next year, the highest risk is within 2 weeks of the most recent stroke/TIA (risk of having a stroke following a stroke or TIA is the same). Ongoing risk factor control by Primary Care Physician Stroke Service will sign off. Please call should any needs arise. Follow up with Dr. Pearlean Brownie, Stroke Clinic, in 2 months.  Annie Main, MSN, RN, ANVP-BC, ANP-BC, [facility] Pager: [phone] 07/14/2013 11:18 AM  I have personally obtained a history, examined the patient, evaluated imaging results, and formulated the assessment and plan of care. I agree with the above.  Delia Heady,  MD

## 2013-07-14 NOTE — H&P (Signed)
Physical Medicine and Rehabilitation Admission H&P  Chief Complaint   Patient presents with   .  Numbness   :  HPI: Deborah Whitehead is a 62 y.o. right-handed female with history of hypertension as well as history of CVA. Patient independent prior to admission and driving. Presented 07/11/2013 with left-sided weakness x2 weeks. MRI of the brain showed numerous punctate foci of acute infarction in the right hemisphere of the right thalamus, occipital parietal junction region and parietal lobe along the junction of the anterior and middle cerebral territories. MRA of the head with no major vessel occlusion. Echocardiogram with ejection fraction of 60% grade 1 diastolic dysfunction . Carotid Dopplers with less than 40% ICA stenosis. Patient did not receive TPA. Neurology services consulted and placed on Plavix for CVA prophylaxis. Patient had been on aspirin prior to admission. Patient refused TEE. Patient tolerating a regular diet. Physical and occupational therapy evaluations completed 07/12/2013 with recommendations for physical medicine rehabilitation consult to consider inpatient rehabilitation services. Patient was felt to be a good candidate for inpatient rehabilitation services and was admitted for comprehensive rehabilitation program    Review of Systems  Respiratory: Positive for shortness of breath.  Gastrointestinal:  GERD  Neurological: Positive for weakness.  Psychiatric/Behavioral: Positive for depression.  Anxiety  All other systems reviewed and are negative  Past Medical History   Diagnosis  Date   .  Anxiety    .  Arthritis    .  Substance abuse    .  Hypertension      dr Pecola Leisure immanuel fp   .  Shortness of breath    .  GERD (gastroesophageal reflux disease)    .  Depression    .  Fibroids      abd   .  Stroke      memory loss    Past Surgical History   Procedure  Laterality  Date   .  Cesarean section     .  Cholecystectomy     .  Abdominal hysterectomy     .   Ventral hernia repair  N/A  06/25/2013     Procedure: LAPAROSCOPIC VENTRAL HERNIA; Surgeon: Robyne Askew, MD; Location: [facility] OR; Service: General; Laterality: N/A;   .  Insertion of mesh  N/A  06/25/2013     Procedure: INSERTION OF MESH; Surgeon: Robyne Askew, MD; Location: [facility] OR; Service: General; Laterality: N/A;    Family History   Problem  Relation  Age of Onset   .  Cancer  Mother      Breast   .  Cancer  Father      Prostate    Social History: reports that she has been smoking. She has never used smokeless tobacco. She reports that she uses illicit drugs (Cocaine). She reports that she does not drink alcohol.  Allergies: No Known Allergies  Medications Prior to Admission   Medication  Sig  Dispense  Refill   .  ALPRAZolam (XANAX) 0.25 MG tablet  Take 1 tablet (0.25 mg total) by mouth 3 (three) times daily as needed for sleep or anxiety.  15 tablet  0   .  aspirin 81 MG tablet  Take 81 mg by mouth daily.     Marland Kitchen  gatifloxacin (ZYMAXID) 0.5 % SOLN  Place 1 drop into the right eye 4 (four) times daily.     Marland Kitchen  ketorolac (ACULAR) 0.4 % SOLN  Place 1 drop into the right eye 4 (four)  times daily.     Marland Kitchen  loratadine (CLARITIN) 10 MG tablet  Take 10 mg by mouth daily as needed for allergies. For allergy symptoms     .  prednisoLONE acetate (PRED FORTE) 1 % ophthalmic suspension  Place 1 drop into the right eye 4 (four) times daily.     .  valsartan-hydrochlorothiazide (DIOVAN-HCT) 160-25 MG per tablet  Take 1 tablet by mouth daily.      Home:  Home Living  Family/patient expects to be discharged to:: Private residence  Living Arrangements: Children  Available Help at Discharge: Family;Available PRN/intermittently  Type of Home: Apartment  Home Access: Stairs to enter  Entrance Stairs-Number of Steps: 10  Entrance Stairs-Rails: Right;Left  Home Layout: One level  Home Equipment: Walker - 2 wheels  Functional History:  Prior Function  Comments: Independent prior to most recent  admission earlier this month  Functional Status:  Mobility:  Bed Mobility  Bed Mobility: Not assessed  Transfers  Transfers: Sit to Stand;Stand to Sit  Sit to Stand: 4: Min assist;With upper extremity assist;From chair/3-in-1;From toilet  Stand to Sit: 4: Min assist;With upper extremity assist;To chair/3-in-1;To toilet  Ambulation/Gait  Ambulation/Gait Assistance: 4: Min assist;3: Mod assist  Ambulation Distance (Feet): 100 Feet  Assistive device: Rolling walker  Ambulation/Gait Assistance Details: Quite unsteady gait, noted trunk instability/ataxia and decr L heel strike with noted toe drag  Gait Pattern: Step-through pattern;Ataxic;Shuffle  Stairs: Yes  Stairs Assistance: 3: Mod assist  Stairs Assistance Details (indicate cue type and reason): Cues for sequence; noted L foot catch on step  Stair Management Technique: One rail Left;Step to pattern;Sideways  Number of Stairs: 3   ADL:  ADL  Eating/Feeding: Performed;Modified independent  Where Assessed - Eating/Feeding: Chair  Grooming: Wash/dry hands;Min guard;Performed  Where Assessed - Grooming: Supported standing  Upper Body Bathing: Simulated;Set up  Where Assessed - Upper Body Bathing: Unsupported sitting  Lower Body Bathing: Simulated;Min guard  Where Assessed - Lower Body Bathing: Supported sit to stand  Upper Body Dressing: Simulated;Minimal assistance  Where Assessed - Upper Body Dressing: Unsupported sitting  Lower Body Dressing: Minimal assistance  Where Assessed - Lower Body Dressing: Supported sit to stand  Toilet Transfer: Performed;Minimal assistance  Toilet Transfer Method: Sit to Production manager: Comfort height toilet;Grab bars  Equipment Used: Gait belt;Rolling walker  Transfers/Ambulation Related to ADLs: min guard with RW, min vc's for safe hand placement during transitions and ambulation  ADL Comments: Pt ambulated from chair to bathroom. Pt performed toilet transfer wih vc's for  sequencing and safe transfer techniques. Pt able to perform peri care, and then Pt performed BUE grooming tasks at sink. Pt is in need og guarding for safety, and Pt needed one arm supporting her while standing at sink for ADL.  Cognition:  Cognition  Overall Cognitive Status: Within Functional Limits for tasks assessed  Orientation Level: Oriented to person;Oriented to place;Oriented to situation;Oriented to time  Cognition  Arousal/Alertness: Awake/alert  Behavior During Therapy: Anxious (about TEE and having something go down her throat)  Overall Cognitive Status: Within Functional Limits for tasks assessed    Physical Exam:  Blood pressure 135/86, pulse 81, temperature 97.9 F (36.6 C), temperature source Oral, resp. rate 18, height 5\' 3"  (1.6 m), weight 100.9 kg (222 lb 7.1 oz), SpO2 99.00%.   General: pt anxious, but generally pleasant HENT: dentition fair, oral mucosa pink and moist Head: Normocephalic. Eyes: EOM are normal.  Neck: Normal range of motion. Neck supple. No  thyromegaly present.  Cardiovascular: Normal rate and regular rhythm. No murmurs or rubs auscultated Pulmonary/Chest: Effort normal and breath sounds normal. No respiratory distress. No wheezes, rales, rhonchi Abdominal: Soft. Bowel sounds are normal. She exhibits no distension. Non-tender Neurological: She is alert. Sitting EOB Patient is pleasant and was able to provide name, place and date of birth. She followed three-step commands. LUE 4+ proximal to distal, LLE 3+ HF, KE to 4/5 at ankle. Mild left PN and past points, diminished FMC of left arm and leg.  Sensation 1/2 left face, arm, leg. Mild left central 7. Gaze appears dysconjugate. Visual acuity a little impaired also. VF grossly intact. Cognitively displays good insight and awareness, although her anxiety does impact her comprehension at times.  Skin: Skin is warm and dry      Post Admission Physician Evaluation:  1. Functional deficits secondary to  ?embolic right thalamic, parietal, and occipital infarct. 2. Patient is admitted to receive collaborative, interdisciplinary care between the physiatrist, rehab nursing staff, and therapy team. 3. Patient's level of medical complexity and substantial therapy needs in context of that medical necessity cannot be provided at a lesser intensity of care such as a SNF. 4. Patient has experienced substantial functional loss from his/her baseline which was documented above under the "Functional History" and "Functional Status" headings. Judging by the patient's diagnosis, physical exam, and functional history, the patient has potential for functional progress which will result in measurable gains while on inpatient rehab. These gains will be of substantial and practical use upon discharge in facilitating mobility and self-care at the household level. 5. Physiatrist will provide 24 hour management of medical needs as well as oversight of the therapy plan/treatment and provide guidance as appropriate regarding the interaction of the two. 6. 24 hour rehab nursing will assist with bladder management, bowel management, safety, skin/wound care, disease management, medication administration, pain management and patient education and help integrate therapy concepts, techniques,education, etc. 7. PT will assess and treat for/with: Lower extremity strength, range of motion, stamina, balance, functional mobility, safety, adaptive techniques and equipment, NMR, education. Goals are: mod I. 8. OT will assess and treat for/with: ADL's, functional mobility, safety, upper extremity strength, adaptive techniques and equipment,NMR, education. Goals are: mod I. 9. SLP will assess and treat for/with: n/a. Goals are: n/a. 10. Case Management and Social Worker will assess and treat for psychological issues and discharge planning. 11. Team conference will be held weekly to assess progress toward goals and to determine barriers to  discharge. 12. Patient will receive at least 3 hours of therapy per day at least 5 days per week. 13. ELOS: 7 days  14. Prognosis: excellent   Medical Problem List and Plan:  1. 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 DVT Prophylaxis/Anticoagulation: SCDs. Monitor for signs of DVT, ambulate 3. Pain Management: Tylenol as needed  4. Mood/anxiety/. Xanax 0.25 3 times a day as needed. Team to provide egosupport  5. Neuropsych: This patient is capable of making decisions on her own behalf.  6. Hypertension. Avapro 150 mg daily. Monitor with increased mobility  7. Hyperlipidemia. Lipitor  8. GERD. Protonix   Ranelle Oyster, MD, [facility] Physical Medicine & Rehabilitation   07/14/2013

## 2013-07-15 ENCOUNTER — Inpatient Hospital Stay (HOSPITAL_COMMUNITY): Payer: Medicare Other | Admitting: Occupational Therapy

## 2013-07-15 ENCOUNTER — Inpatient Hospital Stay (HOSPITAL_COMMUNITY): Payer: Medicare Other

## 2013-07-15 DIAGNOSIS — I639 Cerebral infarction, unspecified: Secondary | ICD-10-CM

## 2013-07-15 DIAGNOSIS — I6340 Cerebral infarction due to embolism of unspecified cerebral artery: Secondary | ICD-10-CM

## 2013-07-15 LAB — COMPREHENSIVE METABOLIC PANEL
ALT: 15 U/L (ref 0–35)
AST: 21 U/L (ref 0–37)
CO2: 29 mEq/L (ref 19–32)
Chloride: 97 mEq/L (ref 96–112)
Creatinine, Ser: 1.23 mg/dL — ABNORMAL HIGH (ref 0.50–1.10)
GFR calc Af Amer: 54 mL/min — ABNORMAL LOW (ref >90)
GFR calc non Af Amer: 46 mL/min — ABNORMAL LOW (ref >90)
Glucose, Bld: 116 mg/dL — ABNORMAL HIGH (ref 70–99)
Sodium: 137 mEq/L (ref 135–145)
Total Bilirubin: 0.5 mg/dL (ref 0.3–1.2)

## 2013-07-15 LAB — CBC WITH DIFFERENTIAL/PLATELET
Basophils Absolute: 0 10*3/uL (ref 0.0–0.1)
HCT: 40.4 % (ref 36.0–46.0)
Lymphocytes Relative: 30 % (ref 12–46)
Lymphs Abs: 1.7 10*3/uL (ref 0.7–4.0)
MCV: 92.9 fL (ref 78.0–100.0)
Monocytes Absolute: 0.5 10*3/uL (ref 0.1–1.0)
Neutro Abs: 3.1 10*3/uL (ref 1.7–7.7)
RBC: 4.35 MIL/uL (ref 3.87–5.11)
RDW: 13 % (ref 11.5–15.5)
WBC: 5.6 10*3/uL (ref 4.0–10.5)

## 2013-07-15 NOTE — Progress Notes (Signed)
Social Work Assessment and Plan  Patient Details  Name: Deborah Whitehead MRN: [id number] Date of Birth: [birth year]  Today's Date: 07/15/2013  Problem List:  Patient Active Problem List   Diagnosis Date Noted  . CVA (cerebral vascular accident) 07/15/2013  . Obesity, unspecified 07/13/2013  . Other and unspecified hyperlipidemia 07/13/2013  . Cigarette smoker 07/13/2013  . Stroke 07/10/2013  . HTN (hypertension) 07/10/2013  . Abdominal pain, unspecified site 05/13/2013   Past Medical History:  Past Medical History  Diagnosis Date  . Anxiety   . Arthritis   . Substance abuse   . Hypertension     dr Pecola Leisure     immanuel fp  . Shortness of breath   . GERD (gastroesophageal reflux disease)   . Depression   . Fibroids     abd  . Stroke     memory loss   Past Surgical History:  Past Surgical History  Procedure Laterality Date  . Cesarean section    . Cholecystectomy    . Abdominal hysterectomy    . Ventral hernia repair N/A 06/25/2013    Procedure: LAPAROSCOPIC VENTRAL HERNIA;  Surgeon: Robyne Askew, MD;  Location: [facility] OR;  Service: General;  Laterality: N/A;  . Insertion of mesh N/A 06/25/2013    Procedure: INSERTION OF MESH;  Surgeon: Robyne Askew, MD;  Location[facility] OR;  Service: General;  Laterality: N/A;   Social History:  reports that she has been smoking.  She has never used smokeless tobacco. She reports that she uses illicit drugs (Cocaine). She reports that she does not drink alcohol.  Family / Support Systems Marital Status: Widow/Widower How Long?: 32 years Patient Roles: Parent Children: Astin Rape - dtr- [phone] (w) [phone] (c) ; Danell Earlene Plater - son- lives in [place] Other Supports: Clinical cytogeneticist; [employer] Family Dynamics: support from daughter, as able.  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 Pt is coping well with new CVA and does not want to go through this again and is "ready to make some changes." Pyschiatric History: Depression and Anxiety  (attends support program at [employer]) Substance Abuse History: hx of cocaine use.  No use for last year.  Patient / Family Perceptions, Expectations & Goals Pt/Family understanding of illness & functional limitations: Pt expresses understanding of CVA: previous CVA 2010.  Pt did not require inpatient rehab with first CVA, so she is ready to make necessary lifestyle changes now.  She has some frustration with not being able to do things for herself, even PTA. Premorbid pt/family roles/activities: Lives with dtr, attends [employer], church, and watches TV. Anticipated changes in roles/activities/participation: Wants to resume activities when able. Pt/family expectations/goals: Pt "wants to walk."  She also would like more independence with ADLs, i.e. putting shoes on and bathing herself more fully under upper extremities.  Community Resources [employer]: Other (Comment) ([employer] - mental health support) Premorbid Home Care/DME Agencies: Other (Comment) ([facility] for walker) Transportation available at discharge: family and [facility]  Discharge Planning Living Arrangements: Children Support Systems: Children;Other relatives;Church/faith community;Organized support group (  Comment) Type of Residence: Private residence Insurance Resources: Medicare;Medicaid (specify county) Medical sales representative) Financial Resources: SSD Financial Screen Referred: No Living Expenses:  Rent Money Management: Patient;Family Does the patient have any problems obtaining your medications?: No Patient/Family Preliminary Plans: Pt plans to return to her apartment where she lives with her daughter.  A nephew or other family member may come to stay with her as needed until daughter gets home from work. Barriers to Discharge: Steps;Self care Social Work Anticipated Follow Up Needs: HH/OP Expected length of stay: 1 week  Clinical Impression Pt reports feeling a little nervous with being in the hospital, but feels better with move to Inpatient Rehab.  She plans to return to her apartment she shares with her daughter with hopes that she will have her own first floor apartment soon and when she is able to be on her own again.  Pt is motivated to work hard to return to walking and the other activities she enjoys.  She is also ready to make the necessary lifestyle changes she needs to so that she will not have another CVA.      Makenzye Troutman, [place] 07/15/2013, 2:47 PM

## 2013-07-15 NOTE — Progress Notes (Signed)
Inpatient Rehabilitation Center Individual Statement of Services  Patient Name:  Deborah Whitehead  Date:  07/15/2013  Welcome to the Inpatient Rehabilitation Center.  Our goal is to provide you with an individualized program based on your diagnosis and situation, designed to meet your specific needs.  With this comprehensive rehabilitation program, you will be expected to participate in at least 3 hours of rehabilitation therapies Monday-Friday, with modified therapy programming on the weekends.  Your rehabilitation program will include the following services:  Physical Therapy (PT), Occupational Therapy (OT), Speech Therapy (ST), 24 hour per day rehabilitation nursing, Therapeutic Recreaction (TR), Neuropsychology, Case Management (Social Worker), Rehabilitation Medicine, Nutrition Services and Pharmacy Services  Weekly team conferences will be held on Wednesdays to discuss your progress.  Your Social Worker will talk with you frequently to get your input and to update you on team discussions.  Team conferences with you and your family in attendance may also be held.  Expected length of stay: 7 days  Overall anticipated outcome: Modified Independent - Supervision  Depending on your progress and recovery, your program may change. Your Social Worker will coordinate services and will keep you informed of any changes. Your Social Worker's name and contact numbers are listed  below.  The following services may also be recommended but are not provided by the Inpatient Rehabilitation Center:   Driving Evaluations  Home Health Rehabiltiation Services  Outpatient Rehabilitatation Servives   Arrangements will be made to provide these services after discharge if needed.  Arrangements include referral to agencies that provide these services.  Your insurance has been verified to be:  Medicare/Medicaid Your primary doctor is:  Dr. Leilani Able   Pertinent information will be shared with your doctor  and your insurance company.  Social Worker:  Staci Acosta  [phone] (o)/ [phone] (c)  Information discussed with and copy given to patient by: Elvera Lennox, 07/15/2013, 2:13 PM
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
## 2013-07-15 NOTE — Progress Notes (Signed)
Patient information reviewed and entered into eRehab system by Mekhai Venuto, RN, CRRN, PPS Coordinator.  Information including medical coding and functional independence measure will be reviewed and updated through discharge.     Per nursing patient was given "Data Collection Information Summary for Patients in Inpatient Rehabilitation Facilities with attached "Privacy Act Statement-Health Care Records" upon admission.  

## 2013-07-15 NOTE — Progress Notes (Signed)
Recreational Therapy Session Note  Patient Details  Name: Deborah Whitehead MRN: [id number] Date of Birth: 02-21-1951 Today's Date: 07/15/2013 Pt participated in animal assisted activity/therapy seated in recliner with supervision.  Dilan Fullenwider 07/15/2013, 5:02 PM

## 2013-07-15 NOTE — Progress Notes (Signed)
Patient ID: Deborah Whitehead, female   DOB: 01-Sep-1951, 62 y.o.   MRN: [id number]  Subjective/Complaints: 62 y.o. right-handed female with history of hypertension as well as history of CVA. Patient independent prior to admission and driving. Presented 07/11/2013 with left-sided weakness x2 weeks. MRI of the brain showed numerous punctate foci of acute infarction in the right hemisphere of the right thalamus, occipital parietal junction region and parietal lobe along the junction of the anterior and middle cerebral territories. MRA of the head with no major vessel occlusion. Echocardiogram with ejection fraction of 60% grade 1 diastolic dysfunction . Carotid Dopplers with less than 40% ICA stenosis. Patient did not receive TPA. Neurology services consulted and placed on Plavix for CVA prophylaxis   Objective: Vital Signs: Blood pressure 100/68, pulse 83, temperature 98.4 F (36.9 C), temperature source Oral, resp. rate 20, weight 99 kg (218 lb 4.1 oz), SpO2 96.00%. No results found. No results found for this or any previous visit (from the past 72 hour(s)).   HEENT: normal Cardio: RRR and no murmur Resp: CTA B/L and unlabored GI: BS positive and non distended Extremity:  Pulses positive and No Edema Skin:   Intact Neuro: Alert/Oriented, Cranial Nerve II-XII normal, Abnormal Sensory reduced sensation on Left, Normal Motor and Abnormal FMC Ataxic/ dec FMC Musc/Skel:  Other Left shoulder pain with ROM Gen NAD   Assessment/Plan: 1. Functional deficits secondary to  Embolic right sided infarcts(parietal, occipital, and thalamus)  which require 3+ hours per day of interdisciplinary therapy in a comprehensive inpatient rehab setting. Physiatrist is providing close team supervision and 24 hour management of active medical problems listed below. Physiatrist and rehab team continue to assess barriers to discharge/monitor patient progress toward functional and medical goals. FIM:       FIM -  Toileting Toileting steps completed by patient: Adjust clothing prior to toileting;Performs perineal hygiene;Adjust clothing after toileting Toileting Assistive Devices: Grab bar or rail for support Toileting: 4: Steadying assist           Comprehension Comprehension Mode: Auditory Comprehension: 6-Follows complex conversation/direction: With extra time/assistive device  Expression Expression Mode: Verbal Expression: 6-Expresses complex ideas: With extra time/assistive device  Social Interaction Social Interaction: 6-Interacts appropriately with others with medication or extra time (anti-anxiety, antidepressant).  Problem Solving Problem Solving: 5-Solves basic problems: With no assist  Memory Memory: 6-More than reasonable amt of time  Medical Problem List and Plan:  1. 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 DVT Prophylaxis/Anticoagulation: SCDs. Monitor for signs of DVT, ambulate  3. Pain Management: Tylenol as needed  4. Mood/anxiety/. Xanax 0.25 3 times a day as needed. Team to provide egosupport  5. Neuropsych: This patient is capable of making decisions on her own behalf.  6. Hypertension. Avapro 150 mg daily. Monitor with increased mobility  7. Hyperlipidemia. Lipitor  8. GERD. Protonix   LOS (Days) 1 A FACE TO FACE EVALUATION WAS PERFORMED  Deborah Whitehead 07/15/2013, 7:02 AM
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
## 2013-07-15 NOTE — Progress Notes (Signed)
Occupational Therapy Session Note  Patient Details  Name: Deborah Whitehead MRN: [id number] Date of Birth: 04/21/1951  Today's Date: 07/15/2013 Time: 4098-1191 Time Calculation (min): 45 min   Skilled Therapeutic Interventions/Progress Updates:   Pt ambulated to the tub/shower room using her RW.  She needed min guard assist for mobility but needed 2-3 standing rest breaks secondary to pt reporting increased pain in her anterior biceps region.  Practiced tub shower transfers stepping over the edge of the tub but needs min assist and demonstrates decreased ability to lift the LLE over the edge of the tub.  Practiced second attempt using the tub/shower bench which pt was able to perform with mod instructional cueing and min guard assist.  Pt will likely need tub bench for home as well.  Also further assessed vision.  She demonstrates visual tracking WFLs as well as saccades.  Peripheral fields are also WFLs.    Therapy Documentation Precautions:  Precautions Precautions: Fall Precaution Comments: need clarification of abdominal precautions due to recent hernia surgery, and bending precautions due to recent cataract surgery, easily distracted. Restrictions Weight Bearing Restrictions: No  Pain: Pain Assessment Pain Assessment: No/denies pain ADL: See FIM for current functional status  Therapy/Group: Individual Therapy  Vantasia Pinkney OTR/L 07/15/2013, 4:02 PM

## 2013-07-16 ENCOUNTER — Inpatient Hospital Stay (HOSPITAL_COMMUNITY): Payer: Medicare Other | Admitting: Occupational Therapy

## 2013-07-16 ENCOUNTER — Inpatient Hospital Stay (HOSPITAL_COMMUNITY): Payer: Medicare Other

## 2013-07-16 DIAGNOSIS — I6340 Cerebral infarction due to embolism of unspecified cerebral artery: Secondary | ICD-10-CM

## 2013-07-16 MED ORDER — LORATADINE 10 MG PO TABS
10.0000 mg | ORAL_TABLET | Freq: Every day | ORAL | Status: DC
  Administered 2013-07-16 – 2013-07-21 (×6): 10 mg via ORAL
  Filled 2013-07-16 (×8): qty 1

## 2013-07-16 NOTE — Progress Notes (Signed)
Occupational Therapy Session Note  Patient Details  Name: MINNAH LLAMAS MRN: [id number] Date of Birth: [birth year]  Today's Date: 07/16/2013 Time: 0900-1000 Time Calculation (min): 60 min  Short Term Goals: Week 1:  OT Short Term Goal 1 (Week 1): STG=LTGs of Overall Mod I for BADL & Simple IADL  Skilled Therapeutic Interventions/Progress Updates:  Self care retraining to include shower, groom and partial dressing secondary to all clothes are not here.  Focused session on dynamic balance during functional mobility and BADL/IADL tasks, adhering to precautions, standing balance/tolerance, don abdominal binder for support, walker safety and stretching as well as soft tissue massage of left bicep and left pec due to c/o tightness.  Patient in recliner with staff present upon departure.  Therapy Documentation Precautions:  Precautions Precautions: Fall Precaution Comments: no pulling/pushing straining abdominals; no bending head past level of heart Restrictions Weight Bearing Restrictions: No Pain: Reports left pec and left bicep increased muscle tightness/spasms and requesting medication for muscle.  RN aware ADL: See FIM for current functional status  Therapy/Group: Individual Therapy  Rhiana Morash 07/16/2013, 1:30 PM

## 2013-07-16 NOTE — Progress Notes (Addendum)
Patient ID: Deborah Whitehead, female   DOB: [birth year], 62 y.o.   MRN: [id number]  Subjective/Complaints: 62 y.o. right-handed female with history of hypertension as well as history of CVA. Patient independent prior to admission and driving. Presented 07/11/2013 with left-sided weakness x2 weeks. MRI of the brain showed numerous punctate foci of acute infarction in the right hemisphere of the right thalamus, occipital parietal junction region and parietal lobe along the junction of the anterior and middle cerebral territories. MRA of the head with no major vessel occlusion. Echocardiogram with ejection fraction of 60% grade 1 diastolic dysfunction . Carotid Dopplers with less than 40% ICA stenosis. Patient did not receive TPA. Neurology services consulted and placed on Plavix for CVA prophylaxis  Had a good night. Denies pain. A little frustrated that coordination is not back in left arm and leg. A 12 point review of systems has been performed and if not noted above is otherwise negative.   Objective: Vital Signs: Blood pressure 112/61, pulse 83, temperature 98.4 F (36.9 C), temperature source Oral, resp. rate 18, weight 98.884 kg (218 lb), SpO2 98.00%. No results found. Results for orders placed during the hospital encounter of 07/14/13 (from the past 72 hour(s))  CBC WITH DIFFERENTIAL     Status: None   Collection Time    07/15/13  6:28 AM      Result Value Range   WBC 5.6  4.0 - 10.5 K/uL   RBC 4.35  3.87 - 5.11 MIL/uL   Hemoglobin 13.0  12.0 - 15.0 g/dL   HCT 65.7  84.6 - 96.2 %   MCV 92.9  78.0 - 100.0 fL   MCH 29.9  26.0 - 34.0 pg   MCHC 32.2  30.0 - 36.0 g/dL   RDW 95.2  84.1 - 32.4 %   Platelets 272  150 - 400 K/uL   Neutrophils Relative % 56  43 - 77 %   Neutro Abs 3.1  1.7 - 7.7 K/uL   Lymphocytes Relative 30  12 - 46 %   Lymphs Abs 1.7  0.7 - 4.0 K/uL   Monocytes Relative 9  3 - 12 %   Monocytes Absolute 0.5  0.1 - 1.0 K/uL   Eosinophils Relative 5  0 - 5 %   Eosinophils Absolute 0.3  0.0 - 0.7 K/uL   Basophils Relative 0  0 - 1 %   Basophils Absolute 0.0  0.0 - 0.1 K/uL  COMPREHENSIVE METABOLIC PANEL     Status: Abnormal   Collection Time    07/15/13  6:28 AM      Result Value Range   Sodium 137  135 - 145 mEq/L   Potassium 4.5  3.5 - 5.1 mEq/L   Chloride 97  96 - 112 mEq/L   CO2 29  19 - 32 mEq/L   Glucose, Bld 116 (*) 70 - 99 mg/dL   BUN 17  6 - 23 mg/dL   Creatinine, Ser 4.01 (*) 0.50 - 1.10 mg/dL   Calcium 9.8  8.4 - 02.7 mg/dL   Total Protein 8.3  6.0 - 8.3 g/dL   Albumin 3.4 (*) 3.5 - 5.2 g/dL   AST 21  0 - 37 U/L   ALT 15  0 - 35 U/L   Alkaline Phosphatase 119 (*) 39 - 117 U/L   Total Bilirubin 0.5  0.3 - 1.2 mg/dL   GFR calc non Af Amer 46 (*) >90 mL/min   GFR calc Af Amer 54 (*) >90 mL/min  Comment:            The eGFR has been calculated     using the CKD EPI equation.     This calculation has not been     validated in all clinical     situations.     eGFR's persistently     <90 mL/min signify     possible Chronic Kidney Disease.     HEENT: normal Cardio: RRR and no murmur Resp: CTA B/L and unlabored GI: BS positive and non distended Extremity:  Pulses positive and No Edema Skin:   Intact Neuro: Alert/Oriented, Cranial Nerve II-XII normal, Abnormal Sensory reduced sensation on Left, Normal Motor LUE, LLE 3+ to 4/5 and Abnormal FMC Ataxic/ dec [facility] Musc/Skel:  Other Left shoulder pain with ROM Gen NAD   Assessment/Plan: 1. Functional deficits secondary to  Embolic right sided infarcts(parietal, occipital, and thalamus)  which require 3+ hours per day of interdisciplinary therapy in a comprehensive inpatient rehab setting. Physiatrist is providing close team supervision and 24 hour management of active medical problems listed below. Physiatrist and rehab team continue to assess barriers to discharge/monitor patient progress toward functional and medical goals. FIM: FIM - Bathing Bathing Steps Patient Completed:  Chest;Right Arm;Left Arm;Abdomen;Front perineal area;Buttocks;Right upper leg;Left upper leg Bathing: 4: Min-Patient completes 8-9 67f 10 parts or 75+ percent  FIM - Upper Body Dressing/Undressing Upper body dressing/undressing steps patient completed: Hook/unhook bra;Thread/unthread left bra strap;Thread/unthread right sleeve of front closure shirt/dress;Thread/unthread left sleeve of front closure shirt/dress;Pull shirt around back of front closure shirt/dress;Button/unbutton shirt (front zipper) Upper body dressing/undressing: 4: Min-Patient completed 75 plus % of tasks FIM - Lower Body Dressing/Undressing Lower body dressing/undressing steps patient completed: Thread/unthread right pants leg;Thread/unthread left pants leg;Pull pants up/down Lower body dressing/undressing: 3: Mod-Patient completed 50-74% of tasks  FIM - Toileting Toileting steps completed by patient: Adjust clothing prior to toileting;Adjust clothing after toileting Toileting Assistive Devices: Grab bar or rail for support Toileting: 3: Mod-Patient completed 2 of 3 steps  FIM - Diplomatic Services operational officer Devices: Elevated toilet seat;Grab bars Toilet Transfers: 4-To toilet/BSC: Min A (steadying Pt. > 75%);4-From toilet/BSC: Min A (steadying Pt. > 75%)     FIM - Locomotion: Ambulation Ambulation/Gait Assistance: 4: Min guard  Comprehension Comprehension Mode: Auditory Comprehension: 7-Follows complex conversation/direction: With no assist  Expression Expression Mode: Verbal Expression: 7-Expresses complex ideas: With no assist  Social Interaction Social Interaction: 7-Interacts appropriately with others - No medications needed.  Problem Solving Problem Solving: 7-Solves complex problems: Recognizes & self-corrects  Memory Memory: 7-Complete Independence: No helper  Medical Problem List and Plan:  1. 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 DVT Prophylaxis/Anticoagulation: SCDs. Monitor for signs of DVT, ambulate  3. Pain Management: Tylenol as needed  4. Mood/anxiety/. Xanax 0.25 3 times a day as needed. Egosupport. In good spirits at present 5. Neuropsych: This patient is capable of making decisions on her own behalf.  6. Hypertension. Avapro 150 mg daily. Normotensive at present 7. Hyperlipidemia. Lipitor  8. GERD. Protonix   LOS (Days) 2 A FACE TO FACE EVALUATION WAS PERFORMED  Verlin Duke T 07/16/2013, 8:57 AM

## 2013-07-16 NOTE — Progress Notes (Addendum)
Physical Therapy Session Note  Patient Details  Name: Deborah Whitehead MRN: [id number] Date of Birth: 04-06-1951  Today's Date: 07/16/2013 Time: 0800-0900 and 1105-1150 Time Calculation (min): 60 min and 45 min  Short Term Goals: Week 1: = LTGs    Skilled Therapeutic Interventions/Progress Updates:  Treatment 1:  neuromuscular re-education via demo, VCS for alternating knee extension in sitting, hip flexion, and ankle DF in preparation for gait; gait; trunk shortening/lengthening in sitting with wt shifting and reaching, L hand fine motor task in sitting  Gait to/from room 160' with RW, several short standing rest breaks, with close supervision, focusing on trunk rotation, = step length, increased hip flexion.  Discussed pt's hx of dizziness/? Vertigo with sneezing.  No vertigo elicited with eye gaze or head movements.  Will follow up with PT who may decide to do vestibular eval.  L hand manipulation of Rx drug bottle lids onto bottles, with R hand assist.  Treatment 2:  Recliner >< w/c stand pivot without RW, supervision, VCS for hand placement.  neuromuscular re-education via VCS for w/c propulsion x 60' using bil UEs, with S; pt veers R due to L UE weakness, but self-corrects.  Attempted teaching for w/c legrest levers to swing away legrests, but pt's UEs are short and she is unable to lean far enough forward without breaking precautions from cataract surgery. Neuromuscular re-education in sitting for L ankle DF for better L foot clearance, and bil hip adduction for more neutral alignment of L hip during gait.  Gait with RW in community setting, VCs for safe hand placement sit>< stand, and to pull out out and sit in armless chair at table.  Scooting chair up/back from table requires pt to reach between legs to grasp chair, x 4 with superviison.  Gait sideways L or R through congested area between tables, without LOB, supervision.    Therapy Documentation Precautions:   Precautions Precautions: Fall Precaution Comments: no pulling/pushing straining abdominals; no bending head past level of heart Restrictions Weight Bearing Restrictions: No Pain: Pain Assessment Pain Score: 5  Pain Location:  (sore all over)   Other Treatments: Treatments Neuromuscular Facilitation: Left;Lower Extremity;Upper Extremity;Forced use;Activity to increase timing and sequencing;Activity to increase motor control;Activity to increase grading;Activity to increase sustained activation;Activity to increase lateral weight shifting;Activity to increase anterior-posterior weight shifting  See FIM for current functional status  Therapy/Group: Individual Therapy  [name] 07/16/2013, 9:03 AM

## 2013-07-16 NOTE — Progress Notes (Signed)
Social Work Patient ID: Deborah Whitehead, female   DOB: 01/26/1951, 62 y.o.   MRN: [id number]   Inpatient RehabilitationTeam Conference and Plan of Care Update  Date: 07/15/2013 Time: 10:35 AM  Patient Name: Deborah Whitehead  Medical Record Number: [id number]  Date of Birth: Apr 16, 1951  Sex: Female  Room/Bed: 4M04C/4M04C-01  Payor Info: Payor: MEDICARE / Plan: MEDICARE PART A AND B / Product Type: *No Product type* /  Admitting Diagnosis: RT CVA  Admit Date/Time: 07/14/2013 3:45 PM  Admission Comments: No comment available  Primary Diagnosis: CVA (cerebral vascular accident)  Principal Problem: CVA (cerebral vascular accident)  Patient Active Problem List    Diagnosis  Date Noted   .  CVA (cerebral vascular accident)  07/15/2013   .  Obesity, unspecified  07/13/2013   .  Other and unspecified hyperlipidemia  07/13/2013   .  Cigarette smoker  07/13/2013   .  Stroke  07/10/2013   .  HTN (hypertension)  07/10/2013   .  Abdominal pain, unspecified site  05/13/2013    Expected Discharge Date: Expected Discharge Date: 07/21/13  Team Members Present:  Physician leading conference: Dr. Claudette Laws  Social Worker Present: Staci Acosta, LCSW  Nurse Present: Kennon Portela, RN  PT Present: Cyndia Skeeters, PT;Caroline Sonia Side, PT  OT Present: Perrin Maltese, Felipa Eth, OT  SLP Present: Fae Pippin, SLP   Current Status/Progress  Goal  Weekly Team Focus   Medical  Right-sided sensory loss and fine motor deficits  Maximize functional improvement for independent living  Evaluate current deficits in determined treatment plan   Bowel/Bladder  continent of bowel and bladder. Pt does have urgancy  remain cont. of bowel and bladder    Swallow/Nutrition/ Hydration  N/A     ADL's  Overall Min assist-Supervision  Overall Mod I except Supervision with shower transfers  activity tolerance,dynamic balance, coordination, neuro re-ed, adhereing to premorbid precautions related to  recent cataract and hernia surgeries, patient and caregiver education   Mobility  min assist transfers, min assist gait x 75' at eval on 07/15/13  modified independent overall for transfers and gait, except car transfer and 10 steps with supervision  neuro re-ed, balance, activity tolerance, transfers and gait on level terrain and steps   Communication  N/A     Safety/Cognition/ Behavioral Observations  N/A     Pain  No complaints of pain     Skin  Dry skin Noted; No skin breakdown evident  remain free of skin breakdown  assess skin qshift    *See Care Plan and progress notes for long and short-term goals.  Barriers to Discharge:  Requires physical assistance as well as supervision 24 7   Possible Resolutions to Barriers:  See above   Discharge Planning/Teaching Needs:  Pt plans to go home to the apartment she shares with her daughter with family support, community agency support, and any HH support team deems necessary.    Team Discussion:  Pt new to unit and all evals were not yet completed at the time of team conference. PT eval completed and goal for pt was set at modified independent for everything with the exception of supervision goals for car, tub/shower transfers, and up/down stairs. Pt is motivated and is doing well at this point.   Revisions to Treatment Plan:  New eval   Continued Need for Acute Rehabilitation Level of Care: The patient requires daily medical management by a physician with specialized training in physical medicine and rehabilitation for  the following conditions:  Daily direction of a multidisciplinary physical rehabilitation program to ensure safe treatment while eliciting the highest outcome that is of practical value to the patient.: Yes  Daily medical management of patient stability for increased activity during participation in an intensive rehabilitation regime.: Yes  Daily analysis of laboratory values and/or radiology reports with any subsequent need for  medication adjustment of medical intervention for : Neurological problems  Sheyanne Munley, Vista Deck  07/16/2013, 2:32 PM

## 2013-07-16 NOTE — Progress Notes (Signed)
Occupational Therapy Session Note  Patient Details  Name: Deborah Whitehead MRN: [id number] Date of Birth: March 13, 1951  Today's Date: 07/16/2013 Time: 4098-1191 Time Calculation (min): 30 min  Short Term Goals: Week 1:  OT Short Term Goal 1 (Week 1): STG=LTGs of Overall Mod I for BADL & Simple IADL  Skilled Therapeutic Interventions/Progress Updates:    Pt seen for 1:1 OT with focus on functional mobility in home environment.  Upon arrival, pt reports need to toilet.  Ambulated to bathroom with RW with close supervision.  Pt completed clothing management and hygiene with supervision.  Ambulated to ADL apartment with supervision, with pt providing own verbal cues for upright posture.  Performed tub/shower transfer x2 with use of grab bars and tub bench and then attempted again without use of grab bars.  Pt reports only microwaving meals and would carry food in one hand while maneuvering RW with other hand.  Daughter present at this time and reports that she would in fact walk without RW prior to this stroke.  Began to discuss safer options, however RN entered room and subject then changed.  Plan to follow up at later session.  Therapy Documentation Precautions:  Precautions Precautions: Fall Precaution Comments: no pulling/pushing straining abdominals; no bending head past level of heart Restrictions Weight Bearing Restrictions: No Pain:  Pt with no c/o pain this session.  See FIM for current functional status  Therapy/Group: Individual Therapy  Rosalio Loud 07/16/2013, 2:41 PM

## 2013-07-16 NOTE — Progress Notes (Signed)
Social Work Patient ID: [name], female   DOB: 05-07-1951, 62 y.o.   MRN: [id number]  Discussed pt in team conference on 07-15-13.  Pt's goals are modified independent for everything with the exception of supervision for car, tub/shower transfers and stairs.  Met with pt to update her on the above and she expressed understanding.  Pt stated that she has support from her dtr and can wait for her to return home from work in the early afternoon to bathe and use the 10 stairs to leave the apartment.  Pt was encouraged with her progress and the expected d/c date of 07-21-13.  CSW spoke with pt's dtr, Yvone Neu, who is willing to assist pt with supervision for bathing, car transfers, and stairs.  She had already spoken with pt about her not doing stairs alone after pt's last hospitalization.  Pt's dtr and dtr's fiance will be able to assist pt with leaving the apartment.  Pt also has assistance from [employer] when she leaves the apartment to go to [employer], although pt has not been going recently and will probably not go for a while after d/c.  Pt/dtr express understanding of needs at d/c and feel comfortable with the d/c plan.  CSW will address with team pt's needs for therapy after d/c from Rehab.

## 2013-07-16 NOTE — Patient Care Conference (Signed)
Inpatient RehabilitationTeam Conference and Plan of Care Update Date: 07/15/2013   Time: 10:35 AM    Patient Name: Deborah Whitehead      Medical Record Number: [id number]  Date of Birth: [birth year] Sex: Female         Room/Bed: 4M04C/4M04C-01 Payor Info: Payor: MEDICARE / Plan: MEDICARE PART A AND B / Product Type: *No Product type* /    Admitting Diagnosis: RT CVA  Admit Date/Time:  07/14/2013  3:45 PM Admission Comments: No comment available   Primary Diagnosis:  CVA (cerebral vascular accident) Principal Problem: CVA (cerebral vascular accident)  Patient Active Problem List   Diagnosis Date Noted  . CVA (cerebral vascular accident) 07/15/2013  . Obesity, unspecified 07/13/2013  . Other and unspecified hyperlipidemia 07/13/2013  . Cigarette smoker 07/13/2013  . Stroke 07/10/2013  . HTN (hypertension) 07/10/2013  . Abdominal pain, unspecified site 05/13/2013    Expected Discharge Date: Expected Discharge Date: 07/21/13  Team Members Present: Physician leading conference: Dr. Claudette Laws Social Worker Present: Staci Acosta, LCSW Nurse Present: Kennon Portela, RN PT Present: Cyndia Skeeters, PT;Caroline Sonia Side, PT OT Present: Perrin Maltese, Felipa Eth, OT SLP Present: Fae Pippin, SLP     Current Status/Progress Goal Weekly Team Focus  Medical   Right-sided sensory loss and fine motor deficits  Maximize functional improvement for independent living  Evaluate current deficits in determined treatment plan   Bowel/Bladder   continent of bowel and bladder. Pt does have urgancy  remain cont. of bowel and bladder      Swallow/Nutrition/ Hydration   N/A        ADL's   Overall Min assist-Supervision  Overall Mod I except Supervision with shower transfers  activity tolerance,dynamic balance, coordination, neuro re-ed, adhereing to premorbid precautions related to recent cataract and hernia surgeries, patient and caregiver education   Mobility   min  assist transfers, min assist gait x 75' at eval on 07/15/13  modified independent overall for transfers and gait, except car transfer and 10 steps with supervision  neuro re-ed, balance, activity tolerance, transfers and gait on level terrain and steps   Communication   N/A        Safety/Cognition/ Behavioral Observations  N/A        Pain   No complaints of pain         Skin   Dry skin Noted; No skin breakdown evident  remain free of skin breakdown  assess skin qshift      *See Care Plan and progress notes for long and short-term goals.  Barriers to Discharge: Requires physical assistance as well as supervision 24 7    Possible Resolutions to Barriers:  See above    Discharge Planning/Teaching Needs:  Pt plans to go home to the apartment she shares with her daughter with family support, community agency support, and any HH support team deems necessary.      Team Discussion:  Pt new to unit and all evals were not yet completed at the time of team conference.  PT eval completed and goal for pt was set at modified independent for everything with the exception of supervision goals for car, tub/shower transfers, and up/down stairs.  Pt is motivated and is doing well at this point.  Revisions to Treatment Plan: New eval   Continued Need for Acute Rehabilitation Level of Care: The patient requires daily medical management by a physician with specialized training in physical medicine and rehabilitation for the following conditions: Daily direction of  a multidisciplinary physical rehabilitation program to ensure safe treatment while eliciting the highest outcome that is of practical value to the patient.: Yes Daily medical management of patient stability for increased activity during participation in an intensive rehabilitation regime.: Yes Daily analysis of laboratory values and/or radiology reports with any subsequent need for medication adjustment of medical intervention for : Neurological  problems  Lakeasha Petion, [place] Deck 07/16/2013, 2:32 PM

## 2013-07-17 ENCOUNTER — Inpatient Hospital Stay (HOSPITAL_COMMUNITY): Payer: Medicare Other | Admitting: *Deleted

## 2013-07-17 ENCOUNTER — Inpatient Hospital Stay (HOSPITAL_COMMUNITY): Payer: Medicare Other | Admitting: Occupational Therapy

## 2013-07-17 MED ORDER — MUSCLE RUB 10-15 % EX CREA
TOPICAL_CREAM | CUTANEOUS | Status: DC | PRN
  Administered 2013-07-17: 13:00:00 via TOPICAL
  Filled 2013-07-17: qty 85

## 2013-07-17 NOTE — Progress Notes (Signed)
Patient slept quietly for short intervals throughout the night.  Awakened multiple times to urinate.  Ambulated to bathroom with walker and supervision.  Denies discomfort when voiding but does experience frequency and urgency.    Deborah Whitehead

## 2013-07-17 NOTE — Progress Notes (Signed)
Occupational Therapy Session Note  Patient Details  Name: BEVERLYN MCGINNESS MRN: [id number] Date of Birth: 28-Mar-1951  Today's Date: 07/17/2013 Time: 0930-1030 Time Calculation (min): 60 min  Short Term Goals: No short term goals set  Skilled Therapeutic Interventions/Progress Updates:      Pt seen for BADL retraining of bathing and dressing with a focus on standing balance and use of AE, including sequencing of steps.  She needed mod VC with use of the reacher to don pants.  She was able to use sock aid but the socks were small footies which made the donning process difficult. She attempted to use the shoe horn, but her shoes were too small and the backs of the shoes were very soft which made donning very difficult. She needed assist to don shoes.  Pt provided with elastic shoe lace, reacher, shoe horn, sock aid, long sponge, and walker bag. Recommended she ask her daughter to pick up a new pair of shoes. Pt ambulated around the room with RW with supervision. No LOB with any standing activities. Pt resting in recliner at end of session.  Therapy Documentation Precautions:  Precautions Precautions: Fall Precaution Comments: no pulling/pushing straining abdominals; no bending head past level of heart Restrictions Weight Bearing Restrictions: No   Pain: Pain Assessment Pain Assessment: No/denies pain ADL:  See FIM for current functional status  Therapy/Group: Individual Therapy  SAGUIER,JULIA 07/17/2013, 11:46 AM

## 2013-07-17 NOTE — Progress Notes (Signed)
Physical Therapy Session Note  Patient Details  Name: Deborah Whitehead MRN: [id number] Date of Birth: 16-Aug-1951  Today's Date: 07/17/2013 Time: 0830-0900  13:00-13:35, 15:00-16:00   Time Calculation (min): 30 min and and  Skilled Therapeutic Interventions/Progress Updates:  First tx: Focus on Berg balance assessment and safety with in-room mobility.  Berg score, see below, 39/56, indicating increased risk of falls. Educated pt on functional implications and home safety. Pt had most difficulty with activities requiring decreased BOS and SLS.  Pt ambulated in room without device and min-guard A due to increased lateral sway. Toilet transfer with S.  Pt maintained all precautions throughout.   Second tx Tx focused on gait training with various devices.  Gait training with:  RW x150' with close S No device 2x100' with Min A  SPC x150' with Min A  Overall, pt needing gait training to reduce rigidity, increase L foot clearance, and decrease lateral sway. Pt has most challenge and less speed without device. Pt very focused on gait and feels like she has to slow herself down and is able to adjust with cues. Overall, RW will likely be best device due to SLOS. Gait quality with RW is most smooth, controlled, and balanced.  Pt left with chair alarm on and all needs in reach.   Third tx focused on Nustep for NMR to increase coordination and timing of movements, gait with SPC and RW, and stair training.  Gait with SPC x150' with Min A overall, 1x150' with RW and close S, much steadier and safer Nustep x23min with bil LE and periodic UE touching (but no pushing/pulling) for increased LE timing and coordination, level 4 with 3 brief rest breaks.  Flight of stairs with Min A overall using L rail ascending and R rail descending, step-to pattern and sideways descent. Pt states this was how she did stairs PTA, but that hand rail at apartment is wide plank, so unable to grip.  VSS throughout.   Toilet transfer with S back in room, in-room navigation with S and safe RW management.  Pt reporting really wanting to be using SPC by the time she goes home, but educated on increased safety with RW at this time.  Chair alarm set.       Therapy Documentation Precautions:  Precautions Precautions: Fall Precaution Comments: no pulling/pushing straining abdominals; no bending head past level of heart Restrictions Weight Bearing Restrictions: No    Pain: Pain Assessment Pain Assessment: No/denies pain Pain Score: 0-No pain Patients Stated Pain Goal: 3 Multiple Pain Sites: No     Balance: Berg Balance Test Sit to Stand: Able to stand without using hands and stabilize independently Standing Unsupported: Able to stand 2 minutes with supervision Sitting with Back Unsupported but Feet Supported on Floor or Stool: Able to sit safely and securely 2 minutes Stand to Sit: Sits safely with minimal use of hands Transfers: Able to transfer safely, minor use of hands Standing Unsupported with Eyes Closed: Able to stand 10 seconds with supervision Standing Ubsupported with Feet Together: Able to place feet together independently but unable to hold for 30 seconds From Standing, Reach Forward with Outstretched Arm: Can reach forward >12 cm safely (5") From Standing Position, Pick up Object from Floor: Able to pick up shoe, needs supervision From Standing Position, Turn to Look Behind Over each Shoulder: Looks behind one side only/other side shows less weight shift Turn 360 Degrees: Able to turn 360 degrees safely but slowly Standing Unsupported, Alternately Place Feet  on Step/Stool: Able to complete >2 steps/needs minimal assist Standing Unsupported, One Foot in Front: Able to take small step independently and hold 30 seconds Standing on One Leg: Tries to lift leg/unable to hold 3 seconds but remains standing independently Total Score: 39  See FIM for current functional  status  Therapy/Group: Individual Therapy  Clydene Laming, PT, DPT   Eulogio Ditch, Richardson Dopp M 07/17/2013, 12:10 PM

## 2013-07-17 NOTE — Progress Notes (Signed)
Patient ID: Deborah Whitehead, female   DOB: 11-17-1951, 62 y.o.   MRN: [id number]  Subjective/Complaints: 62 y.o. right-handed female with history of hypertension as well as history of CVA. Patient independent prior to admission and driving. Presented 07/11/2013 with left-sided weakness x2 weeks. MRI of the brain showed numerous punctate foci of acute infarction in the right hemisphere of the right thalamus, occipital parietal junction region and parietal lobe along the junction of the anterior and middle cerebral territories. MRA of the head with no major vessel occlusion. Echocardiogram with ejection fraction of 60% grade 1 diastolic dysfunction . Carotid Dopplers with less than 40% ICA stenosis. Patient did not receive TPA. Neurology services consulted and placed on Plavix for CVA prophylaxis  Left shoulder a little sore after working with therapy yesterday. A 12 point review of systems has been performed and if not noted above is otherwise negative.   Objective: Vital Signs: Blood pressure 103/73, pulse 78, temperature 98.3 F (36.8 C), temperature source Oral, resp. rate 17, weight 98.884 kg (218 lb), SpO2 96.00%. No results found. Results for orders placed during the hospital encounter of 07/14/13 (from the past 72 hour(s))  CBC WITH DIFFERENTIAL     Status: None   Collection Time    07/15/13  6:28 AM      Result Value Range   WBC 5.6  4.0 - 10.5 K/uL   RBC 4.35  3.87 - 5.11 MIL/uL   Hemoglobin 13.0  12.0 - 15.0 g/dL   HCT 25.3  66.4 - 40.3 %   MCV 92.9  78.0 - 100.0 fL   MCH 29.9  26.0 - 34.0 pg   MCHC 32.2  30.0 - 36.0 g/dL   RDW 47.4  25.9 - 56.3 %   Platelets 272  150 - 400 K/uL   Neutrophils Relative % 56  43 - 77 %   Neutro Abs 3.1  1.7 - 7.7 K/uL   Lymphocytes Relative 30  12 - 46 %   Lymphs Abs 1.7  0.7 - 4.0 K/uL   Monocytes Relative 9  3 - 12 %   Monocytes Absolute 0.5  0.1 - 1.0 K/uL   Eosinophils Relative 5  0 - 5 %   Eosinophils Absolute 0.3  0.0 - 0.7 K/uL   Basophils Relative 0  0 - 1 %   Basophils Absolute 0.0  0.0 - 0.1 K/uL  COMPREHENSIVE METABOLIC PANEL     Status: Abnormal   Collection Time    07/15/13  6:28 AM      Result Value Range   Sodium 137  135 - 145 mEq/L   Potassium 4.5  3.5 - 5.1 mEq/L   Chloride 97  96 - 112 mEq/L   CO2 29  19 - 32 mEq/L   Glucose, Bld 116 (*) 70 - 99 mg/dL   BUN 17  6 - 23 mg/dL   Creatinine, Ser 8.75 (*) 0.50 - 1.10 mg/dL   Calcium 9.8  8.4 - 64.3 mg/dL   Total Protein 8.3  6.0 - 8.3 g/dL   Albumin 3.4 (*) 3.5 - 5.2 g/dL   AST 21  0 - 37 U/L   ALT 15  0 - 35 U/L   Alkaline Phosphatase 119 (*) 39 - 117 U/L   Total Bilirubin 0.5  0.3 - 1.2 mg/dL   GFR calc non Af Amer 46 (*) >90 mL/min   GFR calc Af Amer 54 (*) >90 mL/min   Comment:  The eGFR has been calculated     using the CKD EPI equation.     This calculation has not been     validated in all clinical     situations.     eGFR's persistently     <90 mL/min signify     possible Chronic Kidney Disease.     HEENT: normal Cardio: RRR and no murmur Resp: CTA B/L and unlabored GI: BS positive and non distended Extremity:  Pulses positive and No Edema Skin:   Intact Neuro: Alert/Oriented, Cranial Nerve II-XII normal, Abnormal Sensory reduced sensation on Left, Normal Motor LUE, LLE 3+ to 4/5 and Abnormal FMC Ataxic/ dec [facility] Musc/Skel:  Other Left shoulder pain with ROM in ER and IR. No sublux Gen NAD   Assessment/Plan: 1. Functional deficits secondary to  Embolic right sided infarcts(parietal, occipital, and thalamus)  which require 3+ hours per day of interdisciplinary therapy in a comprehensive inpatient rehab setting. Physiatrist is providing close team supervision and 24 hour management of active medical problems listed below. Physiatrist and rehab team continue to assess barriers to discharge/monitor patient progress toward functional and medical goals. FIM: FIM - Bathing Bathing Steps Patient Completed: Chest;Right Arm;Left  Arm;Abdomen;Front perineal area;Buttocks;Right upper leg;Left upper leg Bathing: 4: Min-Patient completes 8-9 18f 10 parts or 75+ percent  FIM - Upper Body Dressing/Undressing Upper body dressing/undressing steps patient completed: Hook/unhook bra;Thread/unthread left bra strap (hospital gown) Upper body dressing/undressing: 4: Min-Patient completed 75 plus % of tasks FIM - Lower Body Dressing/Undressing Lower body dressing/undressing steps patient completed: Thread/unthread right pants leg;Thread/unthread left pants leg;Pull pants up/down Lower body dressing/undressing: 0: Wears gown/pajamas-no public clothing  FIM - Toileting Toileting steps completed by patient: Adjust clothing prior to toileting;Performs perineal hygiene;Adjust clothing after toileting Toileting Assistive Devices: Grab bar or rail for support Toileting: 5: Supervision: Safety issues/verbal cues  FIM - Diplomatic Services operational officer Devices: Elevated toilet seat;Grab bars Toilet Transfers: 5-To toilet/BSC: Supervision (verbal cues/safety issues);5-From toilet/BSC: Supervision (verbal cues/safety issues)  FIM - Press photographer Assistive Devices: Arm rests Bed/Chair Transfer: 5: Bed > Chair or W/C: Supervision (verbal cues/safety issues);5: Chair or W/C > Bed: Supervision (verbal cues/safety issues)  FIM - Locomotion: Wheelchair Locomotion: Wheelchair: 2: Travels 50 - 149 ft with supervision, cueing or coaxing FIM - Locomotion: Ambulation Locomotion: Ambulation Assistive Devices: Designer, industrial/product Ambulation/Gait Assistance: 5: Supervision Locomotion: Ambulation:  (in hospital room during BADL)  Comprehension Comprehension Mode: Auditory Comprehension: 7-Follows complex conversation/direction: With no assist  Expression Expression Mode: Verbal Expression: 7-Expresses complex ideas: With no assist  Social Interaction Social Interaction: 7-Interacts appropriately with others - No  medications needed.  Problem Solving Problem Solving: 7-Solves complex problems: Recognizes & self-corrects  Memory Memory: 7-Complete Independence: No helper  Medical Problem List and Plan:  1. 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 DVT Prophylaxis/Anticoagulation: SCDs. Monitor for signs of DVT, ambulate  3. Pain Management: Tylenol as needed, will add sports cream for shoulder---mild shoulder/RTC strain 4. Mood/anxiety/. Xanax 0.25 3 times a day as needed. Egosupport. In good spirits at present 5. Neuropsych: This patient is capable of making decisions on her own behalf.  6. Hypertension. Avapro 150 mg daily. Normotensive at present 7. Hyperlipidemia. Lipitor  8. GERD. Protonix   LOS (Days) 3 A FACE TO FACE EVALUATION WAS PERFORMED  SWARTZ,ZACHARY T 07/17/2013, 9:03 AM

## 2013-07-18 ENCOUNTER — Inpatient Hospital Stay (HOSPITAL_COMMUNITY): Payer: Medicare Other | Admitting: Occupational Therapy

## 2013-07-18 ENCOUNTER — Inpatient Hospital Stay (HOSPITAL_COMMUNITY): Payer: Medicare Other | Admitting: Physical Therapy

## 2013-07-18 DIAGNOSIS — I10 Essential (primary) hypertension: Secondary | ICD-10-CM

## 2013-07-18 DIAGNOSIS — E669 Obesity, unspecified: Secondary | ICD-10-CM

## 2013-07-18 DIAGNOSIS — I6350 Cerebral infarction due to unspecified occlusion or stenosis of unspecified cerebral artery: Secondary | ICD-10-CM

## 2013-07-18 DIAGNOSIS — J309 Allergic rhinitis, unspecified: Secondary | ICD-10-CM

## 2013-07-18 NOTE — Progress Notes (Addendum)
Occupational Therapy Session Note  Patient Details  Name: Deborah Whitehead MRN: [id number] Date of Birth: 09-29-1951  Today's Date: 07/18/2013 Time: 0915-1000 Time Calculation (min): 45 min   Skilled Therapeutic Interventions/Progress Updates:  Patient not scheduled for ADL but ended up using the session for ADL since she was soiled after not quite making it to the toilet for a BM.  The focus of her session was on dynamic, challenged balance and LB dressing.   She used reacher, elastic laces, and long handled shoe horn for being more independent with self dressing.  She would benefit from more practice using shoe horn for technique to don her shoes. SHe had mild difficulty placing the shoe horn behind her heels to help slide into the shoes.   Her dynamic balance for showering and dressing and funcitonal mobility (walker level) was good today.   Patient with difficulty reaching buttocks for cleansing after BM and wash bottom.  She stated she will use a hand towel or long towel at home for both tasks.    She clinician informed patient of toilet aide or wrapping tissue around long spaghetti spoon or other similar length.     Therapy Documentation Precautions:  Precautions Precautions: Fall Precaution Comments: no pulling/pushing straining abdominals; no bending head past level of heart Restrictions Weight Bearing Restrictions: No Pain:denied   See FIM for current functional status  Therapy/Group: Individual Therapy  Bud Face [facility] 07/18/2013, 12:48 PM

## 2013-07-18 NOTE — Progress Notes (Signed)
Deborah Whitehead is a 62 y.o. female March 11, 1951 [id number]  Subjective: C/o "sinuses" - taking Claritin. No new problems. Slept well. Feeling OK.  Objective: Vital signs in last 24 hours: Temp:  [98.4 F (36.9 C)-98.5 F (36.9 C)] 98.5 F (36.9 C) (08/02 0543) Pulse Rate:  [83-94] 83 (08/02 0543) Resp:  [18] 18 (08/02 0543) BP: (115-136)/(84-86) 136/86 mmHg (08/02 0543) SpO2:  [94 %-98 %] 98 % (08/02 0543) Weight change:  Last BM Date: 07/17/13  Intake/Output from previous day: 08/01 0701 - 08/02 0700 In: 720 [P.O.:720] Out: 1 [Urine:1] Last cbgs: CBG (last 3)  No results found for this basename: GLUCAP,  in the last 72 hours   Physical Exam General: No apparent distress    HEENT: moist mucosa Lungs: Normal effort. Lungs clear to auscultation, no crackles or wheezes. Cardiovascular: Regular rate and rhythm, no edema Musculoskeletal:  No change from before Neurological: No new neurological deficits Wounds: N/A    Skin: clear Alert, cooperative   Lab Results: BMET    Component Value Date/Time   NA 137 07/15/2013 0628   K 4.5 07/15/2013 0628   CL 97 07/15/2013 0628   CO2 29 07/15/2013 0628   GLUCOSE 116* 07/15/2013 0628   BUN 17 07/15/2013 0628   CREATININE 1.23* 07/15/2013 0628   CALCIUM 9.8 07/15/2013 0628   GFRNONAA 46* 07/15/2013 0628   GFRAA 54* 07/15/2013 0628   CBC    Component Value Date/Time   WBC 5.6 07/15/2013 0628   RBC 4.35 07/15/2013 0628   HGB 13.0 07/15/2013 0628   HCT 40.4 07/15/2013 0628   PLT 272 07/15/2013 0628   MCV 92.9 07/15/2013 0628   MCH 29.9 07/15/2013 0628   MCHC 32.2 07/15/2013 0628   RDW 13.0 07/15/2013 0628   LYMPHSABS 1.7 07/15/2013 0628   MONOABS 0.5 07/15/2013 0628   EOSABS 0.3 07/15/2013 0628   BASOSABS 0.0 07/15/2013 0628    Studies/Results: No results found.  Medications: I have reviewed the patient's current medications.  Assessment/Plan:  1. 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 DVT Prophylaxis/Anticoagulation: SCDs. Monitor for signs of DVT, ambulate  3. Pain Management: Tylenol as needed, will add sports cream for shoulder---mild shoulder/RTC strain  4. Mood/anxiety/. Xanax 0.25 3 times a day as needed. Egosupport. In good spirits at present  5. Neuropsych: This patient is capable of making decisions on her own behalf.  6. Hypertension. Avapro 150 mg daily. Normotensive at present  7. Hyperlipidemia. Lipitor  8. GERD. Protonix  9. Rhinitis. On Claritin     Length of stay, days: 4  Sonda Primes , MD 07/18/2013, 9:09 AM

## 2013-07-19 ENCOUNTER — Inpatient Hospital Stay (HOSPITAL_COMMUNITY): Payer: Medicare Other

## 2013-07-19 ENCOUNTER — Inpatient Hospital Stay (HOSPITAL_COMMUNITY): Payer: Medicare Other | Admitting: *Deleted

## 2013-07-19 ENCOUNTER — Inpatient Hospital Stay (HOSPITAL_COMMUNITY): Payer: Medicare Other | Admitting: Occupational Therapy

## 2013-07-19 MED ORDER — GUAIFENESIN 100 MG/5ML PO SYRP
200.0000 mg | ORAL_SOLUTION | ORAL | Status: DC | PRN
  Administered 2013-07-19 – 2013-07-20 (×3): 200 mg via ORAL
  Filled 2013-07-19 (×5): qty 10

## 2013-07-19 MED ORDER — FLUTICASONE PROPIONATE 50 MCG/ACT NA SUSP
2.0000 | Freq: Every day | NASAL | Status: DC
  Administered 2013-07-19 – 2013-07-21 (×3): 2 via NASAL
  Filled 2013-07-19: qty 16

## 2013-07-19 NOTE — Progress Notes (Signed)
Physical Therapy Note  Patient Details  Name: MORGYN MARUT MRN: [id number] Date of Birth: 20-Apr-1951 Today's Date: 07/19/2013  1:00 - 1:55 55 minutes Individual session Patient denies pain.  Treatment focused on community ambulation, stairs and bed mobility. Patient ambulated 200+ feet x 3 downstairs through atrium and outside on inclined surfaces with RW and supervision. Patient required occasional cueing to decrease drag of left foot. Patient able to negotiate unlevel surfaces and problem solve without assistance.  Patient ambulated up and down 11 steps with rail and cane and close supervision. Patient required occasional cueing for sequencing on stairs. Patient supine <> sit in regular double bed with supervision. Patient left in recliner with all items in reach.    Arelia Longest M 07/19/2013, 2:44 PM

## 2013-07-19 NOTE — Progress Notes (Signed)
Deborah Whitehead is a 62 y.o. female Apr 03, 1951 [id number]  Subjective: C/o "sinuses" - taking Claritin. C/o runny nose, sneezing, cough.   Objective: Vital signs in last 24 hours: Temp:  [98 F (36.7 C)-99 F (37.2 C)] 98 F (36.7 C) (08/03 0600) Pulse Rate:  [75-81] 75 (08/03 0600) Resp:  [20-22] 20 (08/03 0600) BP: (134)/(74-82) 134/74 mmHg (08/03 0600) SpO2:  [98 %] 98 % (08/03 0600) Weight change:  Last BM Date: 07/18/13  Intake/Output from previous day: 08/02 0701 - 08/03 0700 In: 960 [P.O.:960] Out: -  Last cbgs: CBG (last 3)  No results found for this basename: GLUCAP,  in the last 72 hours   Physical Exam General: No apparent distress    HEENT: moist mucosa, no throat erythema Lungs: Normal effort. Lungs clear to auscultation, no crackles or wheezes. Cardiovascular: Regular rate and rhythm, no edema Musculoskeletal:  No change from before Neurological: No new neurological deficits Wounds: N/A    Skin: clear Alert, cooperative   Lab Results: BMET    Component Value Date/Time   NA 137 07/15/2013 0628   K 4.5 07/15/2013 0628   CL 97 07/15/2013 0628   CO2 29 07/15/2013 0628   GLUCOSE 116* 07/15/2013 0628   BUN 17 07/15/2013 0628   CREATININE 1.23* 07/15/2013 0628   CALCIUM 9.8 07/15/2013 0628   GFRNONAA 46* 07/15/2013 0628   GFRAA 54* 07/15/2013 0628   CBC    Component Value Date/Time   WBC 5.6 07/15/2013 0628   RBC 4.35 07/15/2013 0628   HGB 13.0 07/15/2013 0628   HCT 40.4 07/15/2013 0628   PLT 272 07/15/2013 0628   MCV 92.9 07/15/2013 0628   MCH 29.9 07/15/2013 0628   MCHC 32.2 07/15/2013 0628   RDW 13.0 07/15/2013 0628   LYMPHSABS 1.7 07/15/2013 0628   MONOABS 0.5 07/15/2013 0628   EOSABS 0.3 07/15/2013 0628   BASOSABS 0.0 07/15/2013 0628    Studies/Results: No results found.  Medications: I have reviewed the patient's current medications.  Assessment/Plan:  1. 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 DVT Prophylaxis/Anticoagulation: SCDs. Monitor for signs of DVT, ambulate  3. Pain Management: Tylenol as needed, will add sports cream for shoulder---mild shoulder/RTC strain  4. Mood/anxiety/. Xanax 0.25 3 times a day as needed. Egosupport. In good spirits at present  5. Neuropsych: This patient is capable of making decisions on her own behalf.  6. Hypertension. Avapro 150 mg daily. Normotensive at present  7. Hyperlipidemia. Lipitor  8. GERD. Protonix  9. Rhinitis. On Claritin. Allergies vs URI. Added Flonase and Robutussin     Length of stay, days: 5  Sonda Primes , MD 07/19/2013, 8:35 AM

## 2013-07-19 NOTE — Progress Notes (Signed)
Physical Therapy Note  Patient Details  Name: Deborah Whitehead MRN: [id number] Date of Birth: 09-02-1951 Today's Date: 07/19/2013  8:15 - 9:15 60 minutes  individual session Patient denies pain.  Patient found sitting at EOB eating breakfast. Patient ambulated with rolling walker 150 feet with supervision. Patient practiced ambulating with RW over and around obstacles and on foam surface with supervision. Patient ambulated with single point cane 90 feet, over and around obstacles and on foam surface with close supervision. Patient ambulated up and down 5 steps using 1 rail and cane with min assist. Patient performed standing exercises stepping up and down 4 inch step with bilateral UE support to work on increasing left hip flexion during gait.  Patient performed sit to stand from love seat x 5 with supervision to prepare for standing from low surfaces at discharge. Patient exercised on Nustep x 10 minutes on workload 4. Patient ambulated with RW 150 feet with supervision. Patient was left on toilet in room - NT notified.   Arelia Longest M 07/19/2013, 11:58 AM

## 2013-07-19 NOTE — Progress Notes (Signed)
Occupational Therapy Session Note  Patient Details  Name: Deborah Whitehead MRN: [id number] Date of Birth: 1951-12-16  Today's Date: 07/19/2013 Time: 1400-1445 Time Calculation (min): 45 min  Skilled Therapeutic Interventions/Progress Updates: Patient initially scheduled for ADL however OT self care was not administered today due to schedule conflict.  Instead patient participated in dynamic standing tasks to increase balance, crossing midline and with slight challenges.  As well, she wanted to complete various tasks on her cell phone.   She required extra time and slight verbal cues to utilize.  Patient did state this was a new phone.     Therapy Documentation Precautions:  Precautions Precautions: Fall Precaution Comments: no pulling/pushing straining abdominals; no bending head past level of heart Restrictions Weight Bearing Restrictions: No  Pain: Pain Assessment Pain Assessment: No/denies pain  See FIM for current functional status  Therapy/Group: Individual Therapy  Bud Face [facility] 07/19/2013, 4:19 PM

## 2013-07-19 NOTE — Progress Notes (Signed)
Occupational Therapy Note     Name: LEESHA VENO MRN: [id number] Date of Birth: December 19, 1950 Today's Date: 07/19/2013  Time:  1540-1630  (50 min) Pain:  None Individual session  Focus of treatment was bed mobility, transfers,  Neuro-muscular reeducation, sitting balance, standing balance, attention, therapeutic activities.  Pt. Ambulated with RW to gym.  Utilized LUE in game of bowling on the Wii.  Pt. Reported that she could feel the activity working her shoulder especially in her pectoralis muscle.  Ambulated back to her room with SBA and RW.   Humberto Seals 07/19/2013, 4:35 PM

## 2013-07-20 ENCOUNTER — Inpatient Hospital Stay (HOSPITAL_COMMUNITY): Payer: Medicare Other | Admitting: Occupational Therapy

## 2013-07-20 ENCOUNTER — Inpatient Hospital Stay (HOSPITAL_COMMUNITY): Payer: Medicare Other | Admitting: *Deleted

## 2013-07-20 NOTE — Discharge Summary (Signed)
  Discharge summary job [phone]

## 2013-07-20 NOTE — Progress Notes (Addendum)
Physical Therapy Session Note  Patient Details  Name: Deborah Whitehead MRN: [id number] Date of Birth: [birth year]  Today's Date: 07/20/2013 Time: 10:50-11:30 ( ) and 14:00-15:00 ( )   Skilled Therapeutic Interventions/Progress Updates:  First tx Pt up in chair, talking with MSW, excited to go home.  Performed bed<>chair transfers with Mod I and safe technique.  Gait in controlled environment 2x150' with S only in busy situations due to decreased attention to task.  Car transfer with S and 1 cue for safe hand placement.  Seated dynamic balance reaching/placing cones 2x10 Independently without UE support for balance, using LUE across midline in both directions for NMR.  Dynamic standing balance Mod I x82min with reaching/placing tasks with increased time for safety. Repeated Berg Balance test, with score of 49/56, indicating still increased risk of falls but significant increase in score.  Discussed functional and safety implications.    Second tx Home gait x50' Mod I with RW Gait in controlled environment 2x150' with no difficulty, LOB, and able to navigate busy environment with decreased pace.  Flight of 12 stirs with 1 rail on L, step-to ascending and sideways descending with S and 1 cue for sequence.  Nustep x15 min with bil LEs only for increased timing and coordination, challenged to maintain 60spm.  Dynamic standing balance x69min with ball toss and batting in/outside BOS with Mod I.  All transfers Mod I with safe RW management.  Reviewed precautions for eye surgery and hernia surgery. Pt able to verbalize precautions for both. Pt has no further questions or concerns before DC home tomorrow.   All treatments completed except for family training tomorrow with daughter for safe transition home.    Therapy Documentation Precautions:  Precautions Precautions: Fall Precaution Comments: no pulling/pushing straining abdominals; no bending head past level of  heart Restrictions Weight Bearing Restrictions: No Pain: Pain Assessment Pain Assessment: No/denies pain  Balance: Static Standing Balance Static Standing - Level of Assistance: 7: Independent Dynamic Standing Balance Dynamic Standing - Level of Assistance: 6: Modified independent (Device/Increase time)  See FIM for current functional status  Therapy/Group: Individual Therapy  Clydene Laming, PT, DPT   07/20/2013, 11:08 AM
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
## 2013-07-20 NOTE — Progress Notes (Signed)
Occupational Therapy Session Note  Patient Details  Name: Deborah Whitehead MRN: [id number] Date of Birth: [birth year]  Today's Date: 07/20/2013 Time: 5621-3086 Time Calculation (min): 40 min  Short Term Goals: Week 1:  OT Short Term Goal 1 (Week 1): STG=LTGs of Overall Mod I for BADL & Simple IADL  Skilled Therapeutic Interventions/Progress Updates:  Patient ambulated to therapy kitchen to prepare oatmeal using the microwave.  Focused session on walker safety, dynamic balance, and achieve Mod I with simple kitchen task.  Patient reports that the kitchen is small therefore it would be best to place her walker off the the side once she is in the kitchen and use the countertops for stability.  Provided a walker bag to improve independence with carrying items and reviewed other options for items that would not be appropriate for the walker bag.  Patient safe with maneuvering in the kitchen without the RW during simple cooking task.  Therapy Documentation Precautions:  Precautions Precautions: Fall Precaution Comments: no pulling/pushing straining abdominals; no bending head past level of heart Restrictions Weight Bearing Restrictions: No Pain: Denies pain  Therapy/Group: Individual Therapy  Cherryl Babin 07/20/2013, 3:25 PM

## 2013-07-20 NOTE — Social Work (Signed)
Social Work Discharge Note  The overall goal for the admission was met for:   Discharge location: home  Length of Stay: 7 days  Discharge activity level: Mod Independent and supervision for stairs   Home/community participation: Yes  Services provided included: MD, RD, PT, OT, RN, TR, Pharmacy and SW  Financial Services: Medicare and Medicaid  Follow-up services arranged: Home Health: [facility], DME: Tub Transfer Bench through [facility] and Patient/Family has no preference for HH/DME agencies  Comments (or additional information):  Patient/Family verbalized understanding of follow-up arrangements: Yes  Individual responsible for coordination of the follow-up plan: pt and daughter  Confirmed correct DME delivered: Elvera Lennox 07/20/2013    Avaeh Ewer, [place]

## 2013-07-20 NOTE — Progress Notes (Signed)
Patient ID: Deborah Whitehead, female   DOB: [birth year], 62 y.o.   MRN: [id number]  Subjective/Complaints: 62 y.o. right-handed female with history of hypertension as well as history of CVA. Patient independent prior to admission and driving. Presented 07/11/2013 with left-sided weakness x2 weeks. MRI of the brain showed numerous punctate foci of acute infarction in the right hemisphere of the right thalamus, occipital parietal junction region and parietal lobe along the junction of the anterior and middle cerebral territories. MRA of the head with no major vessel occlusion. Echocardiogram with ejection fraction of 60% grade 1 diastolic dysfunction . Carotid Dopplers with less than 40% ICA stenosis. Patient did not receive TPA. Neurology services consulted and placed on Plavix for CVA prophylaxis  In good spirits. No problems over the weekend.   A 12 point review of systems has been performed and if not noted above is otherwise negative.   Objective: Vital Signs: Blood pressure 138/81, pulse 97, temperature 98.3 F (36.8 C), temperature source Oral, resp. rate 20, weight 98.884 kg (218 lb), SpO2 96.00%. No results found. No results found for this or any previous visit (from the past 72 hour(s)).   HEENT: normal Cardio: RRR and no murmur Resp: CTA B/L and unlabored GI: BS positive and non distended Extremity:  Pulses positive and No Edema Skin:   Intact Neuro: Alert/Oriented, Cranial Nerve II-XII normal, Abnormal Sensory reduced sensation on Left, Normal Motor LUE, LLE 3+ to 4/5 and Abnormal [facility]MC Ataxic/ dec [facility] Musc/Skel:  Other Left shoulder pain with ROM in ER and IR. No sublux Gen NAD   Assessment/Plan: 1. Functional deficits secondary to  Embolic right sided infarcts(parietal, occipital, and thalamus)  which require 3+ hours per day of interdisciplinary therapy in a comprehensive inpatient rehab setting. Physiatrist is providing close team supervision and 24 hour management of active  medical problems listed below. Physiatrist and rehab team continue to assess barriers to discharge/monitor patient progress toward functional and medical goals.  Moving well. Walking with cane currently. Discussed secondary prevention with pt this am. FIM: FIM - Bathing Bathing Steps Patient Completed: Chest;Right Arm;Left Arm;Abdomen;Front perineal area;Buttocks;Right upper leg;Left upper leg;Right lower leg (including foot);Left lower leg (including foot) (able to reach feet with sponge) Bathing: 5: Supervision: Safety issues/verbal cues  FIM - Upper Body Dressing/Undressing Upper body dressing/undressing steps patient completed: Thread/unthread right bra strap;Thread/unthread left bra strap;Thread/unthread right sleeve of pullover shirt/dresss;Hook/unhook bra;Thread/unthread left sleeve of pullover shirt/dress;Put head through opening of pull over shirt/dress;Pull shirt over trunk Upper body dressing/undressing: 5: Set-up assist to: Obtain clothing/put away FIM - Lower Body Dressing/Undressing Lower body dressing/undressing steps patient completed: Thread/unthread right underwear leg;Thread/unthread left underwear leg;Pull underwear up/down;Thread/unthread left pants leg;Pull pants up/down;Fasten/unfasten right shoe (used reacher and long handled shoe horn; wore no socks today) Lower body dressing/undressing: 4: Min-Patient completed 75 plus % of tasks  FIM - Toileting Toileting steps completed by patient: Adjust clothing prior to toileting;Performs perineal hygiene;Adjust clothing after toileting Toileting Assistive Devices: Grab bar or rail for support Toileting: 6: Assistive device: No helper  FIM - Diplomatic Services operational officer Devices: Grab bars Toilet Transfers: 5-To toilet/BSC: Supervision (verbal cues/safety issues);5-From toilet/BSC: Supervision (verbal cues/safety issues)  FIM - Press photographer Assistive Devices: Arm rests Bed/Chair  Transfer: 5: Supine > Sit: Supervision (verbal cues/safety issues);5: Sit > Supine: Supervision (verbal cues/safety issues);5: Bed > Chair or W/C: Supervision (verbal cues/safety issues);5: Chair or W/C > Bed: Supervision (verbal cues/safety issues)  FIM - Locomotion: Wheelchair Locomotion: Wheelchair: 0: Activity  did not occur FIM - Locomotion: Ambulation Locomotion: Ambulation Assistive Devices: Walker - Rolling Ambulation/Gait Assistance: 5: Supervision Locomotion: Ambulation: 5: Travels 150 ft or more with supervision/safety issues  Comprehension Comprehension Mode: Auditory Comprehension: 7-Follows complex conversation/direction: With no assist  Expression Expression Mode: Verbal Expression: 7-Expresses complex ideas: With no assist  Social Interaction Social Interaction: 7-Interacts appropriately with others - No medications needed.  Problem Solving Problem Solving: 6-Solves complex problems: With extra time  Memory Memory: 7-Complete Independence: No helper  Medical Problem List and Plan:  1. 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 DVT Prophylaxis/Anticoagulation: SCDs. Monitor for signs of DVT, ambulate  3. Pain Management: Tylenol as needed, will add sports cream for shoulder---mild shoulder/RTC strain 4. Mood/anxiety/. Xanax 0.25 3 times a day as needed. Egosupport. In good spirits at present 5. Neuropsych: This patient is capable of making decisions on her own behalf.  6. Hypertension. Avapro 150 mg daily. Normotensive at present 7. Hyperlipidemia. Lipitor  8. GERD. Protonix   LOS (Days) 6 A FACE TO FACE EVALUATION WAS PERFORMED  SWARTZ,ZACHARY T 07/20/2013, 9:04 AM

## 2013-07-20 NOTE — Progress Notes (Signed)
Occupational Therapy Session Note  Patient Details  Name: Deborah Whitehead MRN: [id number] Date of Birth: 1951-07-05  Today's Date: 07/20/2013 Time: 0900-1005 Time Calculation (min): 65 min  Short Term Goals: No short term goals set  Skilled Therapeutic Interventions/Progress Updates:      Pt seen for BADL retraining of bathing (at shower level) and dressing with a focus on patient using her RW and AE to complete her ADLs at a mod I level.  Pt was able to ambulate around her room with RW to gather clothing and supplies. She has difficulty reaching her bottom to bathe, pt used long bath towel to reach between her legs, educated pt on using tongs with tissue paper after toileting.  Pt was also able to use long sponge to wash back and feet. Pt did well using AE to complete dressing.  She does demonstrate some delayed processing/ complex problem solving, but with extra time she was able to complete tasks without cues or assistance.   Pt then ambulated with RW to tub room to work on tub bench transfers with supervision only.  Pt will need a tub bench and recommending HHOT to assess safety with higher level IADLs as pt is alone for the morning hours.   Therapy Documentation Precautions:  Precautions Precautions: Fall Precaution Comments: no pulling/pushing straining abdominals; no bending head past level of heart Restrictions Weight Bearing Restrictions: No Pain: Pain Assessment Pain Assessment: No/denies pain ADL:  See FIM for current functional status  Therapy/Group: Individual Therapy  Deshawnda Acrey 07/20/2013, 10:30 AM

## 2013-07-21 ENCOUNTER — Ambulatory Visit (HOSPITAL_COMMUNITY): Payer: Medicare Other | Admitting: Physical Therapy

## 2013-07-21 MED ORDER — LORATADINE 10 MG PO TABS: 10.0000 mg | ORAL_TABLET | Freq: Every day | ORAL | Status: DC | PRN

## 2013-07-21 MED ORDER — ATORVASTATIN CALCIUM 10 MG PO TABS: 10.0000 mg | ORAL_TABLET | Freq: Every day | ORAL | Status: DC

## 2013-07-21 MED ORDER — ALPRAZOLAM 0.25 MG PO TABS: 0.2500 mg | ORAL_TABLET | Freq: Three times a day (TID) | ORAL | Status: DC | PRN

## 2013-07-21 MED ORDER — CLOPIDOGREL BISULFATE 75 MG PO TABS: 75.0000 mg | ORAL_TABLET | Freq: Every day | ORAL | Status: AC

## 2013-07-21 MED ORDER — FLUTICASONE PROPIONATE 50 MCG/ACT NA SUSP: 2.0000 | Freq: Every day | NASAL | Status: DC

## 2013-07-21 MED ORDER — PANTOPRAZOLE SODIUM 40 MG PO TBEC: 40.0000 mg | DELAYED_RELEASE_TABLET | Freq: Every day | ORAL | Status: DC

## 2013-07-21 MED ORDER — IRBESARTAN 150 MG PO TABS: 150.0000 mg | ORAL_TABLET | Freq: Every day | ORAL | Status: DC

## 2013-07-21 NOTE — Progress Notes (Signed)
Physical Therapy Session Note  Patient Details  Name: Deborah Whitehead MRN: [id number] Date of Birth: 04/07/1951  Today's Date: 07/21/2013 Time: 4098-1191 Time Calculation (min): 35 min  Short Term Goals: Week 2:   = LTGs  Skilled Therapeutic Interventions/Progress Updates:    Pt seen for pt and family education to complete discharge. Pt's daughter present for treatment session. Mobility performed in apartment treatment area for transfers on various and low surfaces, bed mobility, mobility in kitchen and bathroom, transfers onto tub bench and toilet with use of walker with pt demonstrating modified independence and pt and daughter able to recall foot and hand placement, sequencing and safety recommendation, Car transfers completed with mod I, up and down stairs with 1 railing 15 stairs with good recall of sequencing and safety. Education on fall prevention and awareness as pt does remain fall risk.  Overall good understanding of all instructions provided.     Therapy Documentation Precautions:  Precautions Precautions: Fall Precaution Comments: no pulling/pushing straining abdominals; no bending head past level of heart Restrictions Weight Bearing Restrictions: No       Pain: no pain/ denies pain      Locomotion : Ambulation Ambulation/Gait Assistance: 6: Modified independent (Device/Increase time)                See FIM for current functional status  Therapy/Group: Individual Therapy  Jackelyn Knife 07/21/2013, 4:28 PM

## 2013-07-21 NOTE — Progress Notes (Signed)
Discharged to h ome accompanied by family. Discharge info given to pt and equipment delivered. No questions noted. Deborah Whitehead

## 2013-07-21 NOTE — Progress Notes (Signed)
Patient ID: Deborah Whitehead, female   DOB: 1951/01/21, 62 y.o.   MRN: [id number]  Subjective/Complaints: 62 y.o. right-handed female with history of hypertension as well as history of CVA. Patient independent prior to admission and driving. Presented 07/11/2013 with left-sided weakness x2 weeks. MRI of the brain showed numerous punctate foci of acute infarction in the right hemisphere of the right thalamus, occipital parietal junction region and parietal lobe along the junction of the anterior and middle cerebral territories. MRA of the head with no major vessel occlusion. Echocardiogram with ejection fraction of 60% grade 1 diastolic dysfunction . Carotid Dopplers with less than 40% ICA stenosis. Patient did not receive TPA. Neurology services consulted and placed on Plavix for CVA prophylaxis  In good spirits again. Excited to go home..   A 62 point review of systems has been performed and if not noted above is otherwise negative.   Objective: Vital Signs: Blood pressure 112/75, pulse 81, temperature 98.2 F (36.8 C), temperature source Oral, resp. rate 20, weight 98.884 kg (218 lb), SpO2 95.00%. No results found. No results found for this or any previous visit (from the past 72 hour(s)).   HEENT: normal Cardio: RRR and no murmur Resp: CTA B/L and unlabored GI: BS positive and non distended Extremity:  Pulses positive and No Edema Skin:   Intact Neuro: Alert/Oriented, Cranial Nerve II-XII normal, Abnormal Sensory reduced sensation on Left, Normal Motor LUE, LLE 3+ to 4/5 and Abnormal [facility]MC Ataxic/ dec [facility] Musc/Skel:  Other Left shoulder pain with ROM in ER and IR. No sublux Gen NAD   Assessment/Plan: 1. Functional deficits secondary to  Embolic right sided infarcts(parietal, occipital, and thalamus)  which require 3+ hours per day of interdisciplinary therapy in a comprehensive inpatient rehab setting. Physiatrist is providing close team supervision and 24 hour management of active  medical problems listed below. Physiatrist and rehab team continue to assess barriers to discharge/monitor patient progress toward functional and medical goals.  Home today!! Goals met. Follow up arranged FIM: FIM - Bathing Bathing Steps Patient Completed: Chest;Right Arm;Left Arm;Abdomen;Front perineal area;Buttocks;Right upper leg;Left upper leg;Right lower leg (including foot);Left lower leg (including foot) Bathing: 6: More than reasonable amount of time  FIM - Upper Body Dressing/Undressing Upper body dressing/undressing steps patient completed: Thread/unthread right bra strap;Thread/unthread left bra strap;Hook/unhook bra;Thread/unthread right sleeve of pullover shirt/dresss;Thread/unthread left sleeve of pullover shirt/dress;Put head through opening of pull over shirt/dress;Pull shirt over trunk Upper body dressing/undressing: 6: More than reasonable amount of time FIM - Lower Body Dressing/Undressing Lower body dressing/undressing steps patient completed: Thread/unthread right underwear leg;Thread/unthread left underwear leg;Pull underwear up/down;Thread/unthread right pants leg;Thread/unthread left pants leg;Pull pants up/down;Don/Doff right sock;Don/Doff left sock;Don/Doff right shoe;Don/Doff left shoe Lower body dressing/undressing: 6: More than reasonable amount of time  FIM - Toileting Toileting steps completed by patient: Adjust clothing prior to toileting;Performs perineal hygiene;Adjust clothing after toileting Toileting Assistive Devices: Grab bar or rail for support Toileting: 6: More than reasonable amount of time  FIM - Diplomatic Services operational officer Devices: Art gallery manager Transfers: 6-More than reasonable amt of time  FIM - Banker Devices: Therapist, occupational: 6: Supine > Sit: No assist;6: Sit > Supine: No assist;6: Bed > Chair or W/C: No assist;6: Chair or W/C > Bed: No assist  FIM - Locomotion:  Wheelchair Locomotion: Wheelchair: 0: Activity did not occur (Pt ambulatory) FIM - Locomotion: Ambulation Locomotion: Ambulation Assistive Devices: Designer, industrial/product Ambulation/Gait Assistance: 6: Modified independent (Device/Increase time) Locomotion: Ambulation: 5:  Household Independent - travels 50 - 149 ft independent or modified independent  Comprehension Comprehension Mode: Auditory Comprehension: 7-Follows complex conversation/direction: With no assist  Expression Expression Mode: Verbal Expression: 7-Expresses complex ideas: With no assist  Social Interaction Social Interaction: 7-Interacts appropriately with others - No medications needed.  Problem Solving Problem Solving: 6-Solves complex problems: With extra time  Memory Memory: 7-Complete Independence: No helper  Medical Problem List and Plan:  1. 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 DVT Prophylaxis/Anticoagulation: SCDs. Monitor for signs of DVT, ambulate  3. Pain Management: Tylenol as needed, will add sports cream for shoulder---mild shoulder/RTC strain 4. Mood/anxiety/. Xanax 0.25 3 times a day as needed. Egosupport. In good spirits at present 5. Neuropsych: This patient is capable of making decisions on her own behalf.  6. Hypertension. Avapro 150 mg daily. Normotensive at present 7. Hyperlipidemia. Lipitor  8. GERD. Protonix   LOS (Days) 7 A FACE TO FACE EVALUATION WAS PERFORMED  Keely Drennan T 07/21/2013, 8:29 AM

## 2013-07-21 NOTE — Discharge Summary (Signed)
Deborah Whitehead, Deborah Whitehead            ACCOUNT NO.:  [id number]  MEDICAL RECORD NO.:  [id number]  LOCATION:  4M04C                        FACILITY:  [facility]  PHYSICIAN:  Erick Colace, M.D.DATE OF BIRTH:  1951/02/04  DATE OF ADMISSION:  07/14/2013 DATE OF DISCHARGE:  07/21/2013                              DISCHARGE SUMMARY   DISCHARGE DIAGNOSES: 1. 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 2. Sequential compression devices for deep vein thrombosis     prophylaxis. 3. Anxiety. 4. Hypertension. 5. Hyperlipidemia. 6. Gastroesophageal reflux disease.  HISTORY OF PRESENT ILLNESS:  A 62 year old right-handed female with history of prior CVA, who was independent prior to admission and driving, presented on July 11, 2013, with left-sided weakness x2 weeks. MRI of the brain showed numerous punctate foci of acute infarctions in the right hemisphere.  MRA of the head with no major vessel occlusion. Echocardiogram with ejection fraction of 60% grade 1 diastolic dysfunction.  Carotid Dopplers with less than 40% ICA stenosis.  The patient did not receive t-PA.  Neurology Service was consulted, placed on Plavix for CVA prophylaxis.  The patient had been on aspirin prior to admission.  Plan was made for TEE, but the patient refused.  Tolerating a regular diet.  Physical and occupational therapy on going.  The patient was admitted for comprehensive rehab program.  PAST MEDICAL HISTORY:  See discharge diagnoses.  SOCIAL HISTORY:  Lives with family.  FUNCTIONAL HISTORY:  Prior to admission, independent and driving. Functional status upon admission to rehab services was minimum-to- moderate assist 100 feet with a rolling walker.  PHYSICAL EXAMINATION:  VITAL SIGNS:  Blood pressure 135/86, pulse 81, temperature 97.9, respirations 18. GENERAL:  This was an alert female, in no acute distress. HEENT:  Pupils round and reactive to light.  She was able to  provide name, place, and date of birth as well as follows simple commands. LUNGS:  Clear to auscultation. CARDIAC:  Regular rate and rhythm. ABDOMEN:  Soft, nontender.  Good bowel sounds.  REHABILITATION HOSPITAL COURSE:  The patient was admitted to the inpatient rehab services with therapies initiated on a 3-hour daily basis, consisting of physical therapy, occupational therapy, and rehabilitation nursing.  The following issues were addressed during the patient's rehabilitation stay.  Pertaining to Mrs. Elajah Kunsman' multiple embolic infarcts, she remained on Plavix therapy, would follow up with Neurology Services.  Sequential compression devices were in place for DVT prophylaxis.  She was using Xanax as needed for anxiety with emotional support provided.  Blood pressures well controlled on Avapro with no orthostatic changes.  She remained on Lipitor for history of hyperlipidemia.  The patient received weekly collaborative interdisciplinary team conferences to discuss estimated length of stay, family teaching, and any barriers to her discharge.  She was overall min assist supervision for activities of daily living as well as mobility, ambulating 75 feet.  Full family teaching was completed.  Strength and endurance had greatly improved.  She will be discharged home with ongoing therapies dictated as per rehab services.  DISCHARGE MEDICATIONS: 1. Xanax 0.25 mg p.o. t.i.d. as needed for anxiety. 2. Lipitor 10 mg p.o. daily. 3. Plavix 75 mg p.o. daily. 4. Flonase  2 sprays each nostril daily. 5. Zymaxid 0.5% 1 drop right eye 4 times daily. 6. Avapro 150 mg p.o. daily. 7. Acular 0.5% 1 drop right eye 4 times daily. 8. Claritin 10 mg daily. 9. Protonix 40 mg p.o. daily. 10.Prednisone forte 1% 1 drop right eye 4 times daily.  DIET:  Regular.  SPECIAL INSTRUCTIONS:  The patient would follow up with Dr. Claudette Laws at the outpatient rehab service office on August 14, 2013,  Dr. Delia Heady, Neurology Service, in 1 month, call for appointment.  She would follow up with her primary MD, Dr. Leilani Able, appointment to be made.  Ongoing therapies were arranged as per rehab services.     Mariam Dollar, P.A.   ______________________________ Erick Colace, M.D.    DA/MEDQ  D:  07/20/2013  T:  07/21/2013  Job:  [id number]  cc:   Jocelyn Lamer D. Pecola Leisure, M.D. Pramod P. Pearlean Brownie, MD

## 2013-07-27 ENCOUNTER — Telehealth: Payer: Self-pay

## 2013-07-27 NOTE — Telephone Encounter (Signed)
FYI from [place] Breeze from [facility] stated that Deborah Whitehead has refused Occupational Therapy. She told them that she was able to take care of her self at this time, but she is utilizing the Physical Therapy. Advance just wanted to inform you.

## 2013-08-14 ENCOUNTER — Encounter: Payer: Self-pay | Admitting: Physical Medicine & Rehabilitation

## 2013-08-14 ENCOUNTER — Ambulatory Visit (HOSPITAL_BASED_OUTPATIENT_CLINIC_OR_DEPARTMENT_OTHER): Payer: Medicare Other | Admitting: Physical Medicine & Rehabilitation

## 2013-08-14 ENCOUNTER — Encounter: Payer: Medicare Other | Attending: Physical Medicine & Rehabilitation

## 2013-08-14 VITALS — BP 164/108 | HR 77 | Resp 16 | Ht 63.0 in | Wt 224.0 lb

## 2013-08-14 DIAGNOSIS — I69354 Hemiplegia and hemiparesis following cerebral infarction affecting left non-dominant side: Secondary | ICD-10-CM

## 2013-08-14 DIAGNOSIS — I69959 Hemiplegia and hemiparesis following unspecified cerebrovascular disease affecting unspecified side: Secondary | ICD-10-CM

## 2013-08-14 DIAGNOSIS — I639 Cerebral infarction, unspecified: Secondary | ICD-10-CM

## 2013-08-14 DIAGNOSIS — I6350 Cerebral infarction due to unspecified occlusion or stenosis of unspecified cerebral artery: Secondary | ICD-10-CM

## 2013-08-14 NOTE — Progress Notes (Signed)
Subjective:    Patient ID: Deborah Whitehead, female    DOB: 1951-12-01, 62 y.o.   MRN: [id number]  HPI A 62 year old right-handed female with  history of prior CVA, who was independent prior to admission and  driving, presented on July 11, 2013, with left-sided weakness x2 weeks.  MRI of the brain showed numerous punctate foci of acute infarctions in  the right hemisphere. MRA of the head with no major vessel occlusion.  Echocardiogram with ejection fraction of 60% grade 1 diastolic  dysfunction. Carotid Dopplers with less than 40% ICA stenosis. The  patient did not receive t-PA. Neurology Service was consulted, placed  on Plavix for CVA prophylaxis. The patient had been on aspirin prior to  admission.  Rehabilitation in hospital 07/14/2013 to 07/21/2013 Complaining of left foot and ankle swelling. Had Doppler venous ultrasound which was normal on 07/10/2013. Had home health therapy and was discharged yesterday from PT/OT Pain Inventory Average Pain 6 Pain Right Now na My pain is tingling  In the last 24 hours, has pain interfered with the following? General activity 5 Relation with others 5 Enjoyment of life 5 What TIME of day is your pain at its worst? day,evening Sleep (in general) Fair  Pain is worse with: na Pain improves with: na Relief from Meds: 5  Mobility use a cane ability to climb steps?  yes  Function disabled: date disabled 2012  Neuro/Psych weakness numbness depression anxiety  Prior Studies Any changes since last visit?  no  Physicians involved in your care Any changes since last visit?  no   Family History  Problem Relation Age of Onset  . Cancer Mother     Breast  . Cancer Father     Prostate   History   Social History  . Marital Status: Widowed    Spouse Name: N/A    Number of Children: N/A  . Years of Education: N/A   Social History Main Topics  . Smoking status: Current Every Day Smoker -- 0.25 packs/day for 15 years  .  Smokeless tobacco: Never Used  . Alcohol Use: No     Comment: stopped  . Drug Use: Yes    Special: Cocaine     Comment: stopped x 1 yr  . Sexual Activity: None   Other Topics Concern  . None   Social History Narrative  . None   Past Surgical History  Procedure Laterality Date  . Cesarean section    . Cholecystectomy    . Abdominal hysterectomy    . Ventral hernia repair N/A 06/25/2013    Procedure: LAPAROSCOPIC VENTRAL HERNIA;  Surgeon: Robyne Askew, MD;  Location: [facility] OR;  Service: General;  Laterality: N/A;  . Insertion of mesh N/A 06/25/2013    Procedure: INSERTION OF MESH;  Surgeon: Robyne Askew, MD;  Location: [facility] OR;  Service: General;  Laterality: N/A;   Past Medical History  Diagnosis Date  . Anxiety   . Arthritis   . Substance abuse   . Hypertension     dr Pecola Leisure     immanuel fp  . Shortness of breath   . GERD (gastroesophageal reflux disease)   . Depression   . Fibroids     abd  . Stroke     memory loss   BP 164/108  Pulse 77  Resp 16  Ht 5\' 3"  (1.6 m)  Wt 224 lb (101.606 kg)  BMI 39.69 kg/m2  SpO2 96%     Review of  Systems  Constitutional: Positive for unexpected weight change.  Respiratory: Positive for cough and shortness of breath.   Neurological: Positive for weakness and numbness.  Psychiatric/Behavioral: Positive for dysphoric mood and agitation.  All other systems reviewed and are negative.       Objective:   Physical Exam        Assessment & Plan:  1. Right hemispheric infarcts with residual mild left hemiparesis. She is back to independent level. Using a straight cane. She is followed up with primary care physician however her blood pressures

## 2013-08-14 NOTE — Patient Instructions (Addendum)
Please were compression hose when you are out of bed Elevates your legs when you are sitting. Call Dr. Pecola Leisure for your blood pressure 187/93 in our office Continue your exercises that the PT and the OT gave you. Lose weight

## 2013-08-19 ENCOUNTER — Other Ambulatory Visit: Payer: Self-pay | Admitting: *Deleted

## 2013-08-20 ENCOUNTER — Telehealth: Payer: Self-pay

## 2013-08-20 NOTE — Telephone Encounter (Signed)
Attempted to contact patient to let her know she needed to contact her PCP for medication refills. No answer or voicemail.

## 2013-08-20 NOTE — Telephone Encounter (Signed)
Patient called requesting medication refill.

## 2013-09-15 ENCOUNTER — Encounter (INDEPENDENT_AMBULATORY_CARE_PROVIDER_SITE_OTHER): Payer: Self-pay

## 2014-01-07 ENCOUNTER — Other Ambulatory Visit: Payer: Self-pay | Admitting: Family Medicine

## 2014-01-07 ENCOUNTER — Ambulatory Visit
Admission: RE | Admit: 2014-01-07 | Discharge: 2014-01-07 | Disposition: A | Discharge status: Home / Self Care | Payer: Medicare Other | Source: Ambulatory Visit | Attending: Family Medicine | Admitting: Family Medicine

## 2014-01-07 DIAGNOSIS — Z8673 Personal history of transient ischemic attack (TIA), and cerebral infarction without residual deficits: Secondary | ICD-10-CM

## 2014-01-07 DIAGNOSIS — M21372 Foot drop, left foot: Secondary | ICD-10-CM

## 2014-01-07 LAB — CREATININE, SERUM: Creat: 1.3 mg/dL — ABNORMAL HIGH (ref 0.50–1.10)

## 2014-01-07 LAB — BUN: BUN: 20 mg/dL (ref 6–23)

## 2014-01-07 MED ORDER — IOHEXOL 300 MG/ML  SOLN
75.0000 mL | Freq: Once | INTRAMUSCULAR | Status: AC | PRN
  Administered 2014-01-07: 75 mL via INTRAVENOUS

## 2014-01-19 ENCOUNTER — Encounter: Payer: Self-pay | Admitting: Neurology

## 2014-01-19 ENCOUNTER — Ambulatory Visit (INDEPENDENT_AMBULATORY_CARE_PROVIDER_SITE_OTHER): Payer: Medicare Other | Admitting: Neurology

## 2014-01-19 VITALS — BP 127/83 | HR 89 | Temp 98.1°F | Ht 63.0 in | Wt 216.0 lb

## 2014-01-19 DIAGNOSIS — E669 Obesity, unspecified: Secondary | ICD-10-CM

## 2014-01-19 DIAGNOSIS — R0989 Other specified symptoms and signs involving the circulatory and respiratory systems: Secondary | ICD-10-CM

## 2014-01-19 DIAGNOSIS — F411 Generalized anxiety disorder: Secondary | ICD-10-CM

## 2014-01-19 DIAGNOSIS — F40298 Other specified phobia: Secondary | ICD-10-CM

## 2014-01-19 DIAGNOSIS — F40240 Claustrophobia: Secondary | ICD-10-CM

## 2014-01-19 DIAGNOSIS — Z8673 Personal history of transient ischemic attack (TIA), and cerebral infarction without residual deficits: Secondary | ICD-10-CM

## 2014-01-19 DIAGNOSIS — I639 Cerebral infarction, unspecified: Secondary | ICD-10-CM

## 2014-01-19 DIAGNOSIS — R0683 Snoring: Secondary | ICD-10-CM

## 2014-01-19 DIAGNOSIS — R0609 Other forms of dyspnea: Secondary | ICD-10-CM

## 2014-01-19 DIAGNOSIS — I6350 Cerebral infarction due to unspecified occlusion or stenosis of unspecified cerebral artery: Secondary | ICD-10-CM

## 2014-01-19 NOTE — Patient Instructions (Addendum)
We will do a brain MRI to see if you have had a new stroke. My concern is that you have had a new stroke. Please restart taking your Lipitor. Continue your medications as directed. As discussed, secondary prevention is key after a stroke. This means: taking care of blood sugar values or diabetes management, good blood pressure (hypertension) control and optimizing cholesterol management, exercising daily or regularly within your own mobility limitations of course and overall cardiovascular risk factor reduction, which includes screening for and treatment of obstructive sleep apnea (OSA).

## 2014-01-19 NOTE — Progress Notes (Signed)
Subjective:    Patient ID: CHOSEN GARRON is a 63 y.o. female.  HPI    Star Age, MD, PhD [facility] [address] P.O. [place],  [address]  Dear Dr. Ayesha Rumpf,   I saw your patient, Devinn Voshell, upon your kind request in my neurologic clinic today for initial consultation of her left-sided weakness. The patient is unaccompanied today. As you know, Ms. Dikes is a 63 year old right-handed woman with an underlying medical history of prior stroke, hypertension, hyperlipidemia, allergies, reflux disease, prior cocaine abuse, former smoker and obesity, who reports a left foot drop, left leg numbness, and gait disturbance. This started on 01/07/14 and she woke up with new L foot weakness. She did not call 911 or go to the ER. She had been walking with a cane since her stroke, but went back to using her 2 wheeled walker. She was not given the test results, but was told to make an appointment here. She has not smoked since her stroke and no cocaine since 5/14. No EtOH since the stroke. She never was a heavy drinker. She stopped the Lipitor, but has been taking the Plavix every day.  She lives with her daughter and she does not drive since she moved to [place] in 5/14. She is claustrophobic and takes Xanax. Of snores, and is not sure if she has apneas. She has fallen. She goes to a day program every day.  She had stroke last year, and presented on 07/11/2013 with left-sided weakness x 2 weeks. At the time, stroke workup included MRI of the brain, which showed numerous punctate foci of acute infarctions in the right hemisphere. MRA of the head showed no major vessel occlusion. Echocardiogram showed an EF of 60%, with grade 1 diastolic dysfunction. Carotid Dopplers showed less than 40% ICA stenosis. The patient did not receive t-PA. Neurology Service was consulted and placed her on Plavix for CVA prophylaxis. The patient had been on aspirin prior to  that.  She had a head CT with contrast on 01/07/2014: Subcortical hypodensity in the right frontal lobe, new from prior MRI and CT, acute/subacute infarct not excluded. Prior punctate foci of ischemia/infarct noted on MRI are not evident on CT. Old right occipital infarct. Atrophy with small vessel ischemic changes and mild intracranial atherosclerosis. Following contrast administration, no abnormal enhancement/mass lesion is seen. I personally reviewed the images through the PACS system.  Her Past Medical History Is Significant For: Past Medical History  Diagnosis Date  . Anxiety   . Arthritis   . Substance abuse   . Hypertension     dr Ayesha Rumpf     immanuel fp  . Shortness of breath   . GERD (gastroesophageal reflux disease)   . Depression   . Fibroids     abd  . Stroke     memory loss    Her Past Surgical History Is Significant For: Past Surgical History  Procedure Laterality Date  . Cesarean section    . Cholecystectomy    . Abdominal hysterectomy    . Ventral hernia repair N/A 06/25/2013    Procedure: LAPAROSCOPIC VENTRAL HERNIA;  Surgeon: Merrie Roof, MD;  Location: [place];  Service: General;  Laterality: N/A;  . Insertion of mesh N/A 06/25/2013    Procedure: INSERTION OF MESH;  Surgeon: Merrie Roof, MD;  Location: [place];  Service: General;  Laterality: N/A;    Her Family History Is Significant For: Family History  Problem  Relation Age of Onset  . Cancer Mother     Breast  . Cancer Father     Prostate  . Hypertension Mother   . Diabetes Mother   . Hyperlipidemia Mother   . Asthma Brother   . Thyroid disease Mother     Her Social History Is Significant For: History   Social History  . Marital Status: Widowed    Spouse Name: N/A    Number of Children: N/A  . Years of Education: N/A   Social History Main Topics  . Smoking status: Former Smoker -- 0.25 packs/day for 15 years    Quit date: 06/18/2013  . Smokeless tobacco: Never Used  . Alcohol Use: No      Comment: stopped  . Drug Use: Yes    Special: Cocaine     Comment: stopped x 1 yr  . Sexual Activity: None   Other Topics Concern  . None   Social History Narrative  . None    Her Allergies Are:  No Known Allergies:   Her Current Medications Are:  Outpatient Encounter Prescriptions as of 01/19/2014  Medication Sig  . clopidogrel (PLAVIX) 75 MG tablet Take 1 tablet (75 mg total) by mouth daily with breakfast.  . lisinopril-hydrochlorothiazide (PRINZIDE,ZESTORETIC) 20-25 MG per tablet Take 1 tablet by mouth daily.  . Promethazine HCl 6.25 0000000 SOLN Take 4 application by mouth daily.  Marland Kitchen ALPRAZolam (XANAX) 0.25 MG tablet Take 1 tablet (0.25 mg total) by mouth 3 (three) times daily as needed for sleep or anxiety.  . fluticasone (FLONASE) 50 MCG/ACT nasal spray Place 2 sprays into the nose daily.  Marland Kitchen loratadine (CLARITIN) 10 MG tablet Take 1 tablet (10 mg total) by mouth daily as needed for allergies. For allergy symptoms  . pantoprazole (PROTONIX) 40 MG tablet Take 1 tablet (40 mg total) by mouth daily.  . predniSONE (DELTASONE) 10 MG tablet Take 1 tablet by mouth daily.  . [DISCONTINUED] atorvastatin (LIPITOR) 10 MG tablet Take 1 tablet (10 mg total) by mouth daily at 6 PM.  . [DISCONTINUED] gatifloxacin (ZYMAXID) 0.5 % SOLN Place 1 drop into the right eye 4 (four) times daily.  . [DISCONTINUED] irbesartan (AVAPRO) 150 MG tablet Take 1 tablet (150 mg total) by mouth daily.  . [DISCONTINUED] ketorolac (ACULAR) 0.4 % SOLN Place 1 drop into the right eye 4 (four) times daily.  . [DISCONTINUED] prednisoLONE acetate (PRED FORTE) 1 % ophthalmic suspension Place 1 drop into the right eye 4 (four) times daily.  :   Review of Systems:  Out of a complete 14 point review of systems, all are reviewed and negative with the exception of these symptoms as listed below:   Review of Systems  HENT: Negative.   Eyes: Positive for visual disturbance.  Respiratory: Negative.   Cardiovascular:  Negative.   Gastrointestinal: Negative.   Endocrine: Negative.   Genitourinary: Negative.   Musculoskeletal: Positive for joint swelling and myalgias.  Skin: Negative.   Neurological: Positive for dizziness, tremors, weakness and numbness.  Hematological: Negative.   Psychiatric/Behavioral: Positive for dysphoric mood. The patient is nervous/anxious.     Objective:  Neurologic Exam  Physical Exam Physical Examination:   Filed Vitals:   01/19/14 1329  BP: 127/83  Pulse: 89  Temp: 98.1 F (36.7 C)    General Examination: The patient is a very pleasant 63 y.o. female in no acute distress. She appears well-developed and well-nourished and adequately groomed. She is obese and very anxious.   HEENT: Normocephalic, atraumatic,  pupils are equal, round and reactive to light and accommodation. Funduscopic exam is normal with sharp disc margins noted. Extraocular tracking is good without limitation to gaze excursion or nystagmus noted. Normal smooth pursuit is noted. Hearing is grossly intact. Tympanic membranes are clear bilaterally. Face is symmetric with normal facial animation and normal facial sensation on the R and residual L facial numbness from the prior stroke. Speech is clear with very mild dysarthria noted. There is no hypophonia. There is no lip, neck/head, jaw or voice tremor. Neck is supple with full range of passive and active motion. There are no carotid bruits on auscultation. Oropharynx exam reveals: moderate mouth dryness, adequate dental hygiene and severe airway crowding, due to large tongue, narrow airway, thick soft palate. I could not visualize her tonsils. Mallampati is class III. Tongue protrudes centrally and palate elevates symmetrically. Neck size is 17 inches.   Chest: Clear to auscultation without wheezing, rhonchi or crackles noted.  Heart: S1+S2+0, regular and normal without murmurs, rubs or gallops noted.   Abdomen: Soft, non-tender and non-distended with  normal bowel sounds appreciated on auscultation.  Extremities: There is no pitting edema in the distal lower extremities bilaterally. Pedal pulses are intact.  Skin: Warm and dry without trophic changes noted. There are no varicose veins.  Musculoskeletal: exam reveals no obvious joint deformities, tenderness or joint swelling or erythema.   Neurologically:  Mental status: The patient is awake, alert and oriented in all 4 spheres. Her immediate and remote memory, attention, language skills and fund of knowledge are appropriate. There is no evidence of aphasia, agnosia, apraxia or anomia. Speech is clear with normal prosody and enunciation. Thought process is linear. Mood is anxious and affect is flat.  Cranial nerves II - XII are as described above under HEENT exam. In addition: shoulder shrug is normal with equal shoulder height noted. Motor exam: Normal bulk, strength and tone is noted, with the exception of mild left hip flexor weakness, mild left grip weakness, and left foot drop. There is no drift, tremor or rebound. Romberg is negative. Reflexes are 2+ throughout. Babinski: Toes are left side extensor; right side flexor. Fine motor skills and coordination: Mildly impaired finger taps bilaterally. She has difficulty with rapid alternating patting on the right because of prior right wrist surgery.   Cerebellar testing: No dysmetria or intention tremor on finger to nose testing. Heel to shin is unremarkable on the R and difficult on the L. There is no truncal or gait ataxia.  Sensory exam: intact to light touch, pinprick, vibration, temperature sense on the R and reduced to pinprick and vibration sense in the left leg as well as reduced to temperature sense and pinprick in the left arm. Gait, station and balance: She stands with difficulty. No veering to one side is noted. No leaning to one side is noted. Posture is age-appropriate and stance is narrow based. Gait shows decreased stride length and  decreased pace and L circumduction. She turns in 5 steps. Tandem walk is not possible. She cannot do toe or heel stance.               Assessment and Plan:   In summary, GLENN BUSSARD is a very pleasant 63 y.o.-year old female with an underlying medical history of prior stroke, hypertension, hyperlipidemia, allergies, reflux disease, prior cocaine abuse, former smoker and obesity, who presents with new onset left foot drop, left leg weakness and a clinical presentation consistent with left hemibody sensory loss as well  as mild left-sided weakness. Given her recent head CT finding there is concern that she may have had a new stroke, subacute. She is advised that we will do further workup in the form of MRI brain. She previously had a carotid Doppler study as well as an echocardiogram about 6 months ago so I will not repeat it today. She is advised for now to continue her Plavix but restart her Lipitor which she stopped without having had any side effects. She is advised that I would like to do a sleep study because she has concerning findings for underlying OSA which is an independent risk factor for stroke as well. She is obese and has a history of snoring and has a tight looking airway. She is advised about trying CPAP if the need arises. She is very anxious today and endorses claustrophobia. She can take her Xanax for her MRI. We may have to consider switching her from Plavix to another agent for secondary stroke prevention as she may have failed Plavix. We will address this after the MRI is done. She is advised to use her 2 wheeled walker for now and she may need additional physical therapy and occupational therapy. Again, we will address this after the MRI is completed. I had a long chat with the patient about my findings and the diagnosis of stroke, the prognosis and treatment options. We talked about medical treatments and non-pharmacological approaches. We talked about maintaining a healthy  lifestyle in general. I encouraged the patient to eat healthy, exercise daily and keep well hydrated, to keep a scheduled bedtime and wake time routine, to not skip any meals and eat healthy snacks in between meals and to have protein with every meal.  I talked to her about secondary stroke prevention.   As far as further diagnostic testing is concerned, I suggested the following today: we will schedule her for a brain MRI without contrast, do a sleep study, and we will do blood work today.  As far as medications are concerned, I recommended the following at this time: No new medication but she is advised to restart her Lipitor. Her last LDL was 138.  I answered all  Her questions today and the patient was in agreement with the above outlined plan. I will see her back after these tests are completed. We will also contact her on the phone. I may initiate physical therapy and occupational therapy as an outpatient.   Thank you very much for allowing me to participate in the care of this nice patient. If I can be of any further assistance to you please do not hesitate to call me at [phone].  Sincerely,   Star Age, MD, PhD

## 2014-01-21 ENCOUNTER — Ambulatory Visit (INDEPENDENT_AMBULATORY_CARE_PROVIDER_SITE_OTHER): Payer: Medicare Other

## 2014-01-21 DIAGNOSIS — Z8673 Personal history of transient ischemic attack (TIA), and cerebral infarction without residual deficits: Secondary | ICD-10-CM

## 2014-01-21 DIAGNOSIS — I639 Cerebral infarction, unspecified: Secondary | ICD-10-CM

## 2014-01-21 DIAGNOSIS — E669 Obesity, unspecified: Secondary | ICD-10-CM

## 2014-01-21 DIAGNOSIS — F40240 Claustrophobia: Secondary | ICD-10-CM

## 2014-01-21 DIAGNOSIS — Z0289 Encounter for other administrative examinations: Secondary | ICD-10-CM

## 2014-01-21 DIAGNOSIS — R0683 Snoring: Secondary | ICD-10-CM

## 2014-01-21 DIAGNOSIS — F411 Generalized anxiety disorder: Secondary | ICD-10-CM

## 2014-01-22 LAB — LIPID PANEL
Chol/HDL Ratio: 4.5 ratio units — ABNORMAL HIGH (ref 0.0–4.4)
Cholesterol, Total: 195 mg/dL (ref 100–199)
HDL: 43 mg/dL (ref >39)
LDL Calculated: 138 mg/dL — ABNORMAL HIGH (ref 0–99)
TRIGLYCERIDES: 72 mg/dL (ref 0–149)
VLDL Cholesterol Cal: 14 mg/dL (ref 5–40)

## 2014-01-22 LAB — RPR: SYPHILIS RPR SCR: NONREACTIVE

## 2014-01-22 LAB — SEDIMENTATION RATE: SED RATE: 43 mm/h — AB (ref 0–40)

## 2014-01-22 LAB — HGB A1C W/O EAG: Hgb A1c MFr Bld: 7.1 % — ABNORMAL HIGH (ref 4.8–5.6)

## 2014-02-13 ENCOUNTER — Other Ambulatory Visit: Payer: Medicare Other

## 2014-02-18 ENCOUNTER — Other Ambulatory Visit: Payer: Self-pay | Admitting: Neurology

## 2014-02-18 ENCOUNTER — Ambulatory Visit: Admission: RE | Admit: 2014-02-18 | Payer: Medicare Other | Source: Ambulatory Visit

## 2014-02-18 ENCOUNTER — Ambulatory Visit
Admission: RE | Admit: 2014-02-18 | Discharge: 2014-02-18 | Disposition: A | Discharge status: Home / Self Care | Payer: Medicare Other | Source: Ambulatory Visit | Attending: Neurology | Admitting: Neurology

## 2014-02-18 DIAGNOSIS — F40240 Claustrophobia: Secondary | ICD-10-CM

## 2014-02-18 DIAGNOSIS — R0683 Snoring: Secondary | ICD-10-CM

## 2014-02-18 DIAGNOSIS — F411 Generalized anxiety disorder: Secondary | ICD-10-CM

## 2014-02-18 DIAGNOSIS — Z8673 Personal history of transient ischemic attack (TIA), and cerebral infarction without residual deficits: Secondary | ICD-10-CM

## 2014-02-18 DIAGNOSIS — E669 Obesity, unspecified: Secondary | ICD-10-CM

## 2014-02-18 DIAGNOSIS — I639 Cerebral infarction, unspecified: Secondary | ICD-10-CM

## 2014-03-23 ENCOUNTER — Emergency Department (HOSPITAL_COMMUNITY): Payer: Medicare Other

## 2014-03-23 ENCOUNTER — Emergency Department (HOSPITAL_COMMUNITY)
Admission: EM | Admit: 2014-03-23 | Discharge: 2014-03-23 | Disposition: A | Discharge status: Home / Self Care | Payer: Medicare Other | Attending: Emergency Medicine | Admitting: Emergency Medicine

## 2014-03-23 ENCOUNTER — Encounter (HOSPITAL_COMMUNITY): Payer: Self-pay | Admitting: Emergency Medicine

## 2014-03-23 ENCOUNTER — Inpatient Hospital Stay (HOSPITAL_COMMUNITY)
Admission: EM | Admit: 2014-03-23 | Discharge: 2014-03-29 | DRG: 041 | Disposition: A | Discharge status: Skilled Nursing Facility | Payer: Medicare Other | Attending: Internal Medicine | Admitting: Internal Medicine

## 2014-03-23 ENCOUNTER — Inpatient Hospital Stay (HOSPITAL_COMMUNITY): Payer: Medicare Other

## 2014-03-23 DIAGNOSIS — I6529 Occlusion and stenosis of unspecified carotid artery: Secondary | ICD-10-CM | POA: Diagnosis present

## 2014-03-23 DIAGNOSIS — F329 Major depressive disorder, single episode, unspecified: Secondary | ICD-10-CM | POA: Diagnosis present

## 2014-03-23 DIAGNOSIS — E785 Hyperlipidemia, unspecified: Secondary | ICD-10-CM | POA: Diagnosis present

## 2014-03-23 DIAGNOSIS — M6289 Other specified disorders of muscle: Secondary | ICD-10-CM

## 2014-03-23 DIAGNOSIS — I10 Essential (primary) hypertension: Secondary | ICD-10-CM | POA: Insufficient documentation

## 2014-03-23 DIAGNOSIS — I6340 Cerebral infarction due to embolism of unspecified cerebral artery: Principal | ICD-10-CM | POA: Diagnosis present

## 2014-03-23 DIAGNOSIS — F411 Generalized anxiety disorder: Secondary | ICD-10-CM | POA: Insufficient documentation

## 2014-03-23 DIAGNOSIS — G459 Transient cerebral ischemic attack, unspecified: Secondary | ICD-10-CM | POA: Diagnosis present

## 2014-03-23 DIAGNOSIS — F3289 Other specified depressive episodes: Secondary | ICD-10-CM | POA: Insufficient documentation

## 2014-03-23 DIAGNOSIS — R0602 Shortness of breath: Secondary | ICD-10-CM | POA: Insufficient documentation

## 2014-03-23 DIAGNOSIS — D259 Leiomyoma of uterus, unspecified: Secondary | ICD-10-CM | POA: Insufficient documentation

## 2014-03-23 DIAGNOSIS — R109 Unspecified abdominal pain: Secondary | ICD-10-CM

## 2014-03-23 DIAGNOSIS — K219 Gastro-esophageal reflux disease without esophagitis: Secondary | ICD-10-CM | POA: Insufficient documentation

## 2014-03-23 DIAGNOSIS — Z87891 Personal history of nicotine dependence: Secondary | ICD-10-CM | POA: Insufficient documentation

## 2014-03-23 DIAGNOSIS — Z8673 Personal history of transient ischemic attack (TIA), and cerebral infarction without residual deficits: Secondary | ICD-10-CM

## 2014-03-23 DIAGNOSIS — I658 Occlusion and stenosis of other precerebral arteries: Secondary | ICD-10-CM | POA: Diagnosis present

## 2014-03-23 DIAGNOSIS — M129 Arthropathy, unspecified: Secondary | ICD-10-CM | POA: Insufficient documentation

## 2014-03-23 DIAGNOSIS — Q211 Atrial septal defect: Secondary | ICD-10-CM

## 2014-03-23 DIAGNOSIS — Z7902 Long term (current) use of antithrombotics/antiplatelets: Secondary | ICD-10-CM

## 2014-03-23 DIAGNOSIS — M6281 Muscle weakness (generalized): Secondary | ICD-10-CM

## 2014-03-23 DIAGNOSIS — Z6834 Body mass index (BMI) 34.0-34.9, adult: Secondary | ICD-10-CM

## 2014-03-23 DIAGNOSIS — E669 Obesity, unspecified: Secondary | ICD-10-CM | POA: Diagnosis present

## 2014-03-23 DIAGNOSIS — F1910 Other psychoactive substance abuse, uncomplicated: Secondary | ICD-10-CM | POA: Insufficient documentation

## 2014-03-23 DIAGNOSIS — R4701 Aphasia: Secondary | ICD-10-CM | POA: Diagnosis present

## 2014-03-23 DIAGNOSIS — Q2111 Secundum atrial septal defect: Secondary | ICD-10-CM

## 2014-03-23 DIAGNOSIS — R29898 Other symptoms and signs involving the musculoskeletal system: Secondary | ICD-10-CM | POA: Insufficient documentation

## 2014-03-23 DIAGNOSIS — I639 Cerebral infarction, unspecified: Secondary | ICD-10-CM | POA: Diagnosis present

## 2014-03-23 DIAGNOSIS — F419 Anxiety disorder, unspecified: Secondary | ICD-10-CM | POA: Diagnosis present

## 2014-03-23 DIAGNOSIS — Z79899 Other long term (current) drug therapy: Secondary | ICD-10-CM | POA: Insufficient documentation

## 2014-03-23 DIAGNOSIS — F17210 Nicotine dependence, cigarettes, uncomplicated: Secondary | ICD-10-CM

## 2014-03-23 DIAGNOSIS — R7309 Other abnormal glucose: Secondary | ICD-10-CM | POA: Diagnosis present

## 2014-03-23 LAB — ETHANOL

## 2014-03-23 LAB — RAPID URINE DRUG SCREEN, HOSP PERFORMED
AMPHETAMINES: NOT DETECTED
BARBITURATES: NOT DETECTED
Benzodiazepines: NOT DETECTED
Cocaine: NOT DETECTED
OPIATES: NOT DETECTED
TETRAHYDROCANNABINOL: NOT DETECTED

## 2014-03-23 LAB — COMPREHENSIVE METABOLIC PANEL
ALBUMIN: 3.7 g/dL (ref 3.5–5.2)
ALK PHOS: 111 U/L (ref 39–117)
ALT: 18 U/L (ref 0–35)
AST: 31 U/L (ref 0–37)
BUN: 9 mg/dL (ref 6–23)
CHLORIDE: 103 meq/L (ref 96–112)
CO2: 28 mEq/L (ref 19–32)
Calcium: 9.8 mg/dL (ref 8.4–10.5)
Creatinine, Ser: 0.97 mg/dL (ref 0.50–1.10)
GFR calc Af Amer: 71 mL/min — ABNORMAL LOW (ref >90)
GFR calc non Af Amer: 61 mL/min — ABNORMAL LOW (ref >90)
Glucose, Bld: 89 mg/dL (ref 70–99)
Potassium: 3.9 mEq/L (ref 3.7–5.3)
SODIUM: 146 meq/L (ref 137–147)
TOTAL PROTEIN: 8 g/dL (ref 6.0–8.3)
Total Bilirubin: 0.6 mg/dL (ref 0.3–1.2)

## 2014-03-23 LAB — URINE MICROSCOPIC-ADD ON

## 2014-03-23 LAB — CBC
HCT: 38 % (ref 36.0–46.0)
Hemoglobin: 12.5 g/dL (ref 12.0–15.0)
MCH: 30.3 pg (ref 26.0–34.0)
MCHC: 32.9 g/dL (ref 30.0–36.0)
MCV: 92.2 fL (ref 78.0–100.0)
PLATELETS: 250 10*3/uL (ref 150–400)
RBC: 4.12 MIL/uL (ref 3.87–5.11)
RDW: 13.3 % (ref 11.5–15.5)
WBC: 5.6 10*3/uL (ref 4.0–10.5)

## 2014-03-23 LAB — I-STAT CHEM 8, ED
BUN: 9 mg/dL (ref 6–23)
BUN: 8 mg/dL (ref 6–23)
CALCIUM ION: 1.22 mmol/L (ref 1.13–1.30)
CREATININE: 1.1 mg/dL (ref 0.50–1.10)
Calcium, Ion: 1.23 mmol/L (ref 1.13–1.30)
Chloride: 99 mEq/L (ref 96–112)
Chloride: 99 mEq/L (ref 96–112)
Creatinine, Ser: 1.1 mg/dL (ref 0.50–1.10)
Glucose, Bld: 103 mg/dL — ABNORMAL HIGH (ref 70–99)
Glucose, Bld: 92 mg/dL (ref 70–99)
HCT: 42 % (ref 36.0–46.0)
HEMATOCRIT: 42 % (ref 36.0–46.0)
Hemoglobin: 14.3 g/dL (ref 12.0–15.0)
Hemoglobin: 14.3 g/dL (ref 12.0–15.0)
POTASSIUM: 3.7 meq/L (ref 3.7–5.3)
Potassium: 3.8 mEq/L (ref 3.7–5.3)
Sodium: 143 mEq/L (ref 137–147)
Sodium: 142 mEq/L (ref 137–147)
TCO2: 30 mmol/L (ref 0–100)
TCO2: 32 mmol/L (ref 0–100)

## 2014-03-23 LAB — PROTIME-INR
INR: 1.06 (ref 0.00–1.49)
PROTHROMBIN TIME: 13.6 s (ref 11.6–15.2)

## 2014-03-23 LAB — URINALYSIS, ROUTINE W REFLEX MICROSCOPIC
Bilirubin Urine: NEGATIVE
Glucose, UA: NEGATIVE mg/dL
Ketones, ur: 15 mg/dL — AB
NITRITE: NEGATIVE
Protein, ur: NEGATIVE mg/dL
Specific Gravity, Urine: 1.013 (ref 1.005–1.030)
UROBILINOGEN UA: 1 mg/dL (ref 0.0–1.0)
pH: 6.5 (ref 5.0–8.0)

## 2014-03-23 LAB — DIFFERENTIAL
BASOS ABS: 0 10*3/uL (ref 0.0–0.1)
BASOS PCT: 0 % (ref 0–1)
EOS ABS: 0.1 10*3/uL (ref 0.0–0.7)
Eosinophils Relative: 1 % (ref 0–5)
Lymphocytes Relative: 29 % (ref 12–46)
Lymphs Abs: 1.6 10*3/uL (ref 0.7–4.0)
Monocytes Absolute: 0.4 10*3/uL (ref 0.1–1.0)
Monocytes Relative: 7 % (ref 3–12)
Neutro Abs: 3.5 10*3/uL (ref 1.7–7.7)
Neutrophils Relative %: 63 % (ref 43–77)

## 2014-03-23 LAB — APTT: aPTT: 29 seconds (ref 24–37)

## 2014-03-23 LAB — I-STAT TROPONIN, ED: TROPONIN I, POC: 0 ng/mL (ref 0.00–0.08)

## 2014-03-23 MED ORDER — METOPROLOL TARTRATE 50 MG PO TABS
50.0000 mg | ORAL_TABLET | Freq: Every day | ORAL | Status: DC
  Administered 2014-03-24 – 2014-03-29 (×6): 50 mg via ORAL
  Filled 2014-03-23 (×6): qty 1

## 2014-03-23 MED ORDER — SENNOSIDES-DOCUSATE SODIUM 8.6-50 MG PO TABS: 1.0000 | ORAL_TABLET | Freq: Every evening | ORAL | Status: DC | PRN

## 2014-03-23 MED ORDER — ALPRAZOLAM 0.25 MG PO TABS
0.2500 mg | ORAL_TABLET | Freq: Three times a day (TID) | ORAL | Status: DC | PRN
  Administered 2014-03-24 – 2014-03-29 (×7): 0.25 mg via ORAL
  Filled 2014-03-23 (×7): qty 1

## 2014-03-23 MED ORDER — ENOXAPARIN SODIUM 40 MG/0.4ML ~~LOC~~ SOLN
40.0000 mg | SUBCUTANEOUS | Status: DC
  Administered 2014-03-24 – 2014-03-28 (×5): 40 mg via SUBCUTANEOUS
  Filled 2014-03-23 (×8): qty 0.4

## 2014-03-23 MED ORDER — IBUPROFEN 200 MG PO TABS
200.0000 mg | ORAL_TABLET | Freq: Four times a day (QID) | ORAL | Status: DC | PRN
  Filled 2014-03-23 (×2): qty 1

## 2014-03-23 MED ORDER — CLOPIDOGREL BISULFATE 75 MG PO TABS
75.0000 mg | ORAL_TABLET | Freq: Every day | ORAL | Status: DC
  Administered 2014-03-24 – 2014-03-29 (×5): 75 mg via ORAL
  Filled 2014-03-23 (×7): qty 1

## 2014-03-23 MED ORDER — MELOXICAM 15 MG PO TABS
15.0000 mg | ORAL_TABLET | Freq: Every day | ORAL | Status: DC
  Administered 2014-03-24 – 2014-03-29 (×6): 15 mg via ORAL
  Filled 2014-03-23 (×6): qty 1

## 2014-03-23 MED ORDER — SODIUM CHLORIDE 0.9 % IV SOLN
INTRAVENOUS | Status: DC
  Administered 2014-03-23 (×2): via INTRAVENOUS

## 2014-03-23 MED ORDER — ATORVASTATIN CALCIUM 40 MG PO TABS
40.0000 mg | ORAL_TABLET | Freq: Every day | ORAL | Status: DC
  Administered 2014-03-24 – 2014-03-28 (×5): 40 mg via ORAL
  Filled 2014-03-23 (×6): qty 1

## 2014-03-23 MED ORDER — LORAZEPAM 2 MG/ML IJ SOLN
2.0000 mg | Freq: Once | INTRAMUSCULAR | Status: AC
  Administered 2014-03-23: 2 mg via INTRAVENOUS
  Filled 2014-03-23: qty 1

## 2014-03-23 NOTE — ED Notes (Signed)
Presents with c/o inability to hold onto walker with left hand on Monday afternoon, resolved.  Pt woke with difficulty again, unable to open left hand.  Moving all without difficulty now.

## 2014-03-23 NOTE — ED Provider Notes (Signed)
CSN: [id number]     Arrival date & time 03/23/14  0125 History   First MD Initiated Contact with Patient 03/23/14 0202     Chief Complaint  Patient presents with  . Hand Problem     (Consider location/radiation/quality/duration/timing/severity/associated sxs/prior Treatment) HPI This patient is a 63 year old woman with history of anxiety, substance abuse, hypertension and a questionable history of CVA.  She lives with her daughter. The patient said she had a hard time listening her left-handed grip on her walker this evening. She said she had to tell her brain to let go of a walker. This raised concern for stroke in her mind.  The patient's symptoms have since resolved. She has not experienced any other motor symptoms. No visual changes. No slurred speech or behavioral changes per the patient's daughter.    Past Medical History  Diagnosis Date  . Anxiety   . Arthritis   . Substance abuse   . Hypertension     dr Ayesha Rumpf     immanuel fp  . Shortness of breath   . GERD (gastroesophageal reflux disease)   . Depression   . Fibroids     abd  . Stroke     memory loss   Past Surgical History  Procedure Laterality Date  . Cesarean section    . Cholecystectomy    . Abdominal hysterectomy    . Ventral hernia repair N/A 06/25/2013    Procedure: LAPAROSCOPIC VENTRAL HERNIA;  Surgeon: Merrie Roof, MD;  Location: [place];  Service: General;  Laterality: N/A;  . Insertion of mesh N/A 06/25/2013    Procedure: INSERTION OF MESH;  Surgeon: Merrie Roof, MD;  Location: [place];  Service: General;  Laterality: N/A;   Family History  Problem Relation Age of Onset  . Cancer Mother     Breast  . Cancer Father     Prostate  . Hypertension Mother   . Diabetes Mother   . Hyperlipidemia Mother   . Asthma Brother   . Thyroid disease Mother    History  Substance Use Topics  . Smoking status: Former Smoker -- 0.25 packs/day for 15 years    Quit date: 06/18/2013  . Smokeless tobacco: Never  Used  . Alcohol Use: No     Comment: stopped   OB History   Grav Para Term Preterm Abortions TAB SAB Ect Mult Living                 Review of Systems Ten point review of symptoms performed and is negative with the exception of symptoms noted above.     Allergies  Review of patient's allergies indicates no known allergies.  Home Medications   Current Outpatient Rx  Name  Route  Sig  Dispense  Refill  . ALPRAZolam (XANAX) 0.25 MG tablet   Oral   Take 1 tablet (0.25 mg total) by mouth 3 (three) times daily as needed for sleep or anxiety.   30 tablet   0   . clopidogrel (PLAVIX) 75 MG tablet   Oral   Take 1 tablet (75 mg total) by mouth daily with breakfast.   30 tablet   1   . fluticasone (FLONASE) 50 MCG/ACT nasal spray   Nasal   Place 2 sprays into the nose daily.   1 g   2   . lisinopril-hydrochlorothiazide (PRINZIDE,ZESTORETIC) 20-25 MG per tablet   Oral   Take 1 tablet by mouth daily.         Marland Kitchen  loratadine (CLARITIN) 10 MG tablet   Oral   Take 1 tablet (10 mg total) by mouth daily as needed for allergies. For allergy symptoms   30 tablet   1   . pantoprazole (PROTONIX) 40 MG tablet   Oral   Take 1 tablet (40 mg total) by mouth daily.   30 tablet   1   . predniSONE (DELTASONE) 10 MG tablet   Oral   Take 1 tablet by mouth daily.         . Promethazine HCl 6.25 MG/5ML SOLN   Oral   Take 4 application by mouth daily.          Temp(Src) 97.6 F (36.4 C) (Oral)  Resp 19  Ht 5\' 3"  (1.6 m)  Wt 212 lb (96.163 kg)  BMI 37.56 kg/m2  SpO2 99% Physical Exam Gen: well developed and well nourished appearing Head: NCAT Eyes: PERL, EOMI Nose: no epistaixis or rhinorrhea Mouth/throat: mucosa is moist and pink Neck: supple, no stridor Lungs: CTA B, no wheezing, rhonchi or rales CV: regular rate and rythm, good distal pulses.  Abd: soft, notender, nondistended Back: no ttp, no cva ttp Skin: warm and dry Ext: no edema, normal to inspection Neuro:  CN ii-xii grossly intact, 5/5 motor strength in all major muscle groups of the arms and legs bilaterally, normal speech Psyche; anxious affect,  calm and cooperative.  ED Course  Procedures (including critical care time) Labs Review Labs Reviewed  I-STAT CHEM 8, ED - Abnormal; Notable for the following:    Glucose, Bld 103 (*)    All other components within normal limits    MDM   Patient with normal NIH stroke screen. Sx are inconsistent with TIA. Patient's neuro exam is normal and she is asymptomatic. Stable for d/c with plan to f/u with [facility].   Elyn Peers, MD 03/23/14 [phone]

## 2014-03-23 NOTE — Discharge Instructions (Signed)
Please return to the ED if you have any urgent health concerns or recurrent neurologic disturbance.

## 2014-03-23 NOTE — ED Notes (Signed)
Patient transported to MRI 

## 2014-03-23 NOTE — ED Provider Notes (Signed)
CSN: [id number]     Arrival date & time 03/23/14  1133 History   First MD Initiated Contact with Patient 03/23/14 1135     Chief Complaint  Patient presents with  . Aphasia     (Consider location/radiation/quality/duration/timing/severity/associated sxs/prior Treatment) HPI Comments: 63 year old female the past medical history of stroke with left-sided deficit, depression, anxiety, arthritis, hypertension, GERD and substance abuse presents to the emergency department via EMS from her adult daycare after having a five-minute episode of where she could not form her words. Also states her left hand cramped up for the second time in 24 hours, lasting about 2 hours, resolving on its own. Currently states she has tingling in her left fingers. She was seen in the emergency department last night for the same symptom in her left hand, had an i-STAT 8 checked which was normal and was discharged. She has a history of stroke which affects her left side causing slight weakness, she walks with a walker. Admits to blurry vision. Denies chest pain, shortness of breath, headache, dizziness, lightheadedness, increased weakness. Denies recent medication changes.  The history is provided by the patient and the EMS personnel.    Past Medical History  Diagnosis Date  . Anxiety   . Arthritis   . Substance abuse   . Hypertension     dr Ayesha Rumpf     immanuel fp  . Shortness of breath   . GERD (gastroesophageal reflux disease)   . Depression   . Fibroids     abd  . Stroke     memory loss   Past Surgical History  Procedure Laterality Date  . Cesarean section    . Cholecystectomy    . Abdominal hysterectomy    . Ventral hernia repair N/A 06/25/2013    Procedure: LAPAROSCOPIC VENTRAL HERNIA;  Surgeon: Merrie Roof, MD;  Location: [place];  Service: General;  Laterality: N/A;  . Insertion of mesh N/A 06/25/2013    Procedure: INSERTION OF MESH;  Surgeon: Merrie Roof, MD;  Location: [place];  Service: General;   Laterality: N/A;   Family History  Problem Relation Age of Onset  . Cancer Mother     Breast  . Cancer Father     Prostate  . Hypertension Mother   . Diabetes Mother   . Hyperlipidemia Mother   . Asthma Brother   . Thyroid disease Mother    History  Substance Use Topics  . Smoking status: Former Smoker -- 0.25 packs/day for 15 years    Quit date: 06/18/2013  . Smokeless tobacco: Never Used  . Alcohol Use: No     Comment: stopped   OB History   Grav Para Term Preterm Abortions TAB SAB Ect Mult Living                 Review of Systems  Musculoskeletal: Positive for myalgias (left hand).  Neurological: Positive for speech difficulty.       Positive for left hand tingling.  All other systems reviewed and are negative.      Allergies  Review of patient's allergies indicates no known allergies.  Home Medications   Current Outpatient Rx  Name  Route  Sig  Dispense  Refill  . ALPRAZolam (XANAX) 0.25 MG tablet   Oral   Take 1 tablet (0.25 mg total) by mouth 3 (three) times daily as needed for sleep or anxiety.   30 tablet   0   . clopidogrel (PLAVIX) 75 MG tablet  Oral   Take 1 tablet (75 mg total) by mouth daily with breakfast.   30 tablet   1   . hydrochlorothiazide (HYDRODIURIL) 25 MG tablet   Oral   Take 25 mg by mouth daily.         Marland Kitchen ibuprofen (ADVIL,MOTRIN) 200 MG tablet   Oral   Take 200 mg by mouth every 6 (six) hours as needed for moderate pain.         . meloxicam (MOBIC) 15 MG tablet   Oral   Take 15 mg by mouth daily.         . metoprolol (LOPRESSOR) 50 MG tablet   Oral   Take 50 mg by mouth daily.          BP 154/87  Pulse 70  Temp(Src) 98 F (36.7 C) (Oral)  Resp 16  Wt 212 lb (96.163 kg)  SpO2 100% Physical Exam  Nursing note and vitals reviewed. Constitutional: She is oriented to person, place, and time. She appears well-developed and well-nourished. No distress.  HENT:  Head: Normocephalic and atraumatic.   Mouth/Throat: Oropharynx is clear and moist.  Eyes: Conjunctivae and EOM are normal. Pupils are equal, round, and reactive to light.  Neck: Normal range of motion. Neck supple.  Cardiovascular: Normal rate, regular rhythm and normal heart sounds.   Pulmonary/Chest: Effort normal and breath sounds normal.  Musculoskeletal: Normal range of motion. She exhibits no edema.  Full ROM left hand, non-tender, no deformity, no contracture or tremor.  Neurological: She is alert and oriented to person, place, and time. No cranial nerve deficit or sensory deficit. Coordination normal. GCS eye subscore is 4. GCS verbal subscore is 5. GCS motor subscore is 6.   Strength 4/5 left upper arm lower extremities compared to 5/5 right upper lower extremities.  Skin: Skin is warm and dry. She is not diaphoretic.  Psychiatric: Her behavior is normal. Her mood appears anxious.    ED Course  Procedures (including critical care time) Labs Review Labs Reviewed  COMPREHENSIVE METABOLIC PANEL - Abnormal; Notable for the following:    GFR calc non Af Amer 61 (*)    GFR calc Af Amer 71 (*)    All other components within normal limits  ETHANOL  PROTIME-INR  APTT  CBC  DIFFERENTIAL  URINE RAPID DRUG SCREEN (HOSP PERFORMED)  URINALYSIS, ROUTINE W REFLEX MICROSCOPIC  I-STAT CHEM 8, ED  I-STAT TROPOININ, ED  I-STAT TROPOININ, ED   Imaging Review Ct Head Wo Contrast  03/23/2014   CLINICAL DATA:  APHASIA  EXAM: CT HEAD WITHOUT CONTRAST  TECHNIQUE: Contiguous axial images were obtained from the base of the skull through the vertex without intravenous contrast.  COMPARISON:  CT HEAD WO/W CM dated 01/07/2014  FINDINGS: No acute intracranial abnormality. Specifically, no hemorrhage, hydrocephalus, mass lesion, acute infarction, or significant intracranial injury. No acute calvarial abnormality. Chronic areas of lacunar infarction within the vertex of the right frontal lobe and right occipital lobe. An area of subacute to  chronic infarction within the posterior periphery of the right parietal occipital region image 18 series 2. This finding occurred in the interim compared to prior. Diffuse areas of low attenuation within the subcortical, deep, and periventricular white matter regions consistent small vessel white matter ischaemia. Ventricles and cisterns are patent. Visualized paranasal sinuses and mastoid air cells are patent. There is mild diffuse cortical atrophy.  IMPRESSION: Subacute and chronic changes without evidence of acute abnormalities.  Involutional changes also appreciated.   Electronically  Signed   By: Margaree Mackintosh M.D.   On: 03/23/2014 13:18     EKG Interpretation   Date/Time:  Tuesday March 23 2014 11:40:26 EDT Ventricular Rate:  73 PR Interval:  172 QRS Duration: 78 QT Interval:  400 QTC Calculation: 441 R Axis:   50 Text Interpretation:  Sinus rhythm Left atrial enlargement Confirmed by  Zenia Resides  MD, ANTHONY ([id number]) on 03/23/2014 11:47:46 AM      MDM   Final diagnoses:  TIA (transient ischemic attack)  Aphasia   Patient presenting for the second time with left-sided hand cramping/tingling, also with five-minute episode of aphasia. History of stroke. Appears anxious but in NAD. VSS. Labs pending. CT head pending. Probable admission for recurrent TIAs.  CT head negative, labs without acute finding, will admit for recurrent TIAs with hx of stroke. MRI pending. Admission accepted by Dr. Clementeen Graham, [facility].  Case discussed with attending Dr. Zenia Resides who also evaluated patient and agrees with plan of care.   Illene Labrador, PA-C 03/24/14 1523

## 2014-03-23 NOTE — ED Notes (Signed)
Patient transported to X-ray 

## 2014-03-23 NOTE — ED Notes (Signed)
Notified MRI to call 30 minutes prior to procedure so we can administer Ativan prior to.

## 2014-03-23 NOTE — Consult Note (Signed)
Neurology Consultation Reason for Consult: Stroke Referring Physician: Dhungel, N  CC: Stroke  History is obtained from: Medical record  HPI: Deborah Whitehead is a 63 y.o. female with a history of hypertension who presented to the emergency room last evening with transient left hand dysfunction. She had abnormal behavior in the emergency room, but her daughter stated that this was normal because the patient's her from extreme anxiety when near doctors. She was diagnosed with TIA versus anxiety and discharged from the emergency room.   She again had more difficulty with left side weakness this morning and has returned. She had an MRI which shows right posterior stroke.  She is unfortunately unable to give me much in the way of history due to having been sedated with Ativan for her MRI.   LKW: Monday afternoon, unclear on time tpa given?: no, outside of window    ROS: Unable to assess secondary to patient's altered mental status.    Past Medical History  Diagnosis Date  . Anxiety   . Arthritis   . Substance abuse   . Hypertension     dr Ayesha Rumpf     immanuel fp  . Shortness of breath   . GERD (gastroesophageal reflux disease)   . Depression   . Fibroids     abd  . Stroke     memory loss    Family History: Unable to assess secondary to patient's altered mental status.    Social History: Tob: Unable to assess secondary to patient's altered mental status.    Exam: Current vital signs: BP 137/69  Pulse 71  Temp(Src) 98 F (36.7 C) (Oral)  Resp 18  Ht 5\' 5"  (1.651 m)  Wt 94.167 kg (207 lb 9.6 oz)  BMI 34.55 kg/m2  SpO2 100% Vital signs in last 24 hours: Temp:  [97.6 F (36.4 C)-98.2 F (36.8 C)] 98 F (36.7 C) (04/07 2036) Pulse Rate:  [65-79] 71 (04/07 2036) Resp:  [14-25] 18 (04/07 2036) BP: (117-165)/(69-145) 137/69 mmHg (04/07 2036) SpO2:  [94 %-100 %] 100 % (04/07 2036) Weight:  [94.167 kg (207 lb 9.6 oz)-96.163 kg (212 lb)] 94.167 kg (207 lb 9.6 oz)  (04/07 2036)  Exam is markedly limited by recent Ativan administration.  General: in bed, asleep CV: RRR Mental Status: Patient is lethargic, with repeated stimulation, she is finally able to rouse enough to tell me that she is at [facility] health. She'll answer some simple questions, but states "I'm getting angry" and refuses to participate further. Cranial Nerves: II: She appears to orient towards visual stimuli in both hemi-fields, and acknowledges finger movement in both hemi-fields but exam is limited. Pupils are equal, round, and reactive to light.  Discs are difficult to visualize. III,IV, VI: She crosses midline both directions V: Facial sensation is symmetric to temperature VII: Facial movement is symmetric.  VIII: hearing is intact to voice X, XI, XII: Unable to assess secondary to patient's altered mental status.  Motor: She does not cooperate with formal testing, but does move all extremities spontaneously. Sensory: Response to noxious stimulation in all 4 extremities Deep Tendon Reflexes: 2+ and symmetric in the biceps and patellae.  Cerebellar: Patient does not cooperate Gait: Not tested secondary to patient sedation    I have reviewed labs in epic and the results pertinent to this consultation are: CMP-unremarkable CBC-normal  I have reviewed the images obtained: MRI brain right posterior parieto-occipital infarct.   Impression: 63 year old female with a history of previous stroke in a similar  distribution. The infarct seen could be watershed, but no clear stenosis to explain the unilateral nature, and an embolus that broke up is also possible.  Recommendations: 1. HgbA1c, fasting lipid panel 2. Frequent neuro checks 3. Echocardiogram 4. Carotid dopplers 5. Prophylactic therapy-Antiplatelet med: Aspirin - dose 325mg  PO or 300mg  PR 6. Risk factor modification 7. Telemetry monitoring 8. PT consult, OT consult, Speech consult   Roland Rack, MD [facility] [phone]  If 7pm- 7am, please page neurology on call as listed in [place].

## 2014-03-23 NOTE — ED Notes (Signed)
Symptoms resolved currently.

## 2014-03-23 NOTE — H&P (Signed)
[facility] History and Physical  JONNIE KUBLY MWN:[id number] DOB: Jan 24, 1951 DOA: 03/23/2014  Referring physician: Dr. Zenia Resides PCP: Kristine Garbe, MD   Chief Complaint:  Slurred speech and left-sided weakness since one day  HPI:  63 year old obese female with history of hypertension, multiple right-sided stroke in 2014 (discharged on Plavix and statin.), Anxiety, depression and GERD presented to the ED last evening with acute onset of slurred speech and left upper extremity weakness mainly with poor hand incoordination. Patient was evaluated in the ED and given clinically low suspicion for TIA it was discharged home. Symptoms resolved after patient went home. Patient however returned back later this morning again with left upper extremity weakness, again with poor hand grip and aphasia that lasted for several minutes. Patient denies any dizziness, loss of consciousness or fall.  Patient denies headache, dizziness, fever, chills,  chest pain, palpitations, SOB, abdominal pain, bowel or urinary symptoms. Denies change in weight or appetite. She reports frequent nausea and episodes of vomiting at home that has been ongoing for several weeks. Denies any recent illness or travel. reports being compliant with medications.   Course in the ED Patient's vitals were stable. Her speech had started to become clearer after arrival to the ED. Since her symptoms had started almost 12 hours back was not a candidate for TPA.  A head CT done in the ED was unremarkable. [facility] called for admission  To telmetry.   Review of Systems:  Constitutional: Denies fever, chills, diaphoresis, appetite change and fatigue.  HEENT: Denies photophobia, eye pain, redness, hearing loss, ear pain, congestion, sore throat, rhinorrhea, sneezing, mouth sores, trouble swallowing, neck pain, neck stiffness and tinnitus.   Respiratory: Denies SOB, DOE, cough, chest tightness,  and wheezing.   Cardiovascular:  Denies chest pain, palpitations and leg swelling.  Gastrointestinal: frequent nausea and vomiting,  abdominal pain, diarrhea, constipation, blood in stool and abdominal distention.  Genitourinary: Denies dysuria, urgency, frequency, hematuria, flank pain and difficulty urinating.  Endocrine: Denies: hot or cold intolerance, sweats,  polyuria, polydipsia. Musculoskeletal: Denies myalgias, back pain, joint swelling, arthralgias and gait problem.  Skin: Denies pallor, rash and wound.  Neurological: Left-sided weakness, slurred speech, Denies dizziness, seizures, syncope, , light-headedness, numbness and headaches.  Psychiatric/Behavioral: Denies  confusion, nervousness, sleep disturbance and agitation   Past Medical History  Diagnosis Date  . Anxiety   . Arthritis   . Substance abuse   . Hypertension     dr Ayesha Rumpf     immanuel fp  . Shortness of breath   . GERD (gastroesophageal reflux disease)   . Depression   . Fibroids     abd  . Stroke     memory loss   Past Surgical History  Procedure Laterality Date  . Cesarean section    . Cholecystectomy    . Abdominal hysterectomy    . Ventral hernia repair N/A 06/25/2013    Procedure: LAPAROSCOPIC VENTRAL HERNIA;  Surgeon: Merrie Roof, MD;  Location: [facility];  Service: General;  Laterality: N/A;  . Insertion of mesh N/A 06/25/2013    Procedure: INSERTION OF MESH;  Surgeon: Merrie Roof, MD;  Location: [facility];  Service: General;  Laterality: N/A;   Social History:  reports that she quit smoking about 9 months ago. She has never used smokeless tobacco. She reports that she uses illicit drugs (Cocaine). She reports that she does not drink alcohol.  No Known Allergies  Family History  Problem Relation Age  of Onset  . Cancer Mother     Breast  . Cancer Father     Prostate  . Hypertension Mother   . Diabetes Mother   . Hyperlipidemia Mother   . Asthma Brother   . Thyroid disease Mother     Prior to Admission medications    Medication Sig Start Date End Date Taking? Authorizing Provider  ALPRAZolam (XANAX) 0.25 MG tablet Take 1 tablet (0.25 mg total) by mouth 3 (three) times daily as needed for sleep or anxiety. 07/21/13  Yes Daniel J Angiulli, PA-C  clopidogrel (PLAVIX) 75 MG tablet Take 1 tablet (75 mg total) by mouth daily with breakfast. 07/21/13  Yes Daniel J Angiulli, PA-C  hydrochlorothiazide (HYDRODIURIL) 25 MG tablet Take 25 mg by mouth daily.   Yes Historical Provider, MD  ibuprofen (ADVIL,MOTRIN) 200 MG tablet Take 200 mg by mouth every 6 (six) hours as needed for moderate pain.   Yes Historical Provider, MD  meloxicam (MOBIC) 15 MG tablet Take 15 mg by mouth daily.   Yes Historical Provider, MD  metoprolol (LOPRESSOR) 50 MG tablet Take 50 mg by mouth daily.   Yes Historical Provider, MD     Physical Exam:  Filed Vitals:   03/23/14 1336 03/23/14 1345 03/23/14 1400 03/23/14 1415  BP:  154/87 160/83 148/81  Pulse:  70 71 71  Temp: 98 F (36.7 C)     TempSrc:      Resp:  16 17 16   Weight:      SpO2:  100% 100% 100%    Constitutional: Vital signs reviewed.  Patient is an elderly obese female in no acute distress. HEENT: no pallor, no icterus, moist oral mucosa,  Cardiovascular: RRR, S1 normal, S2 normal, no MRG Chest: CTAB, no wheezes, rales, or rhonchi Abdominal: Soft. Non-tender, non-distended, bowel sounds are normal, no masses, organomegaly, or guarding present.  Ext: warm, trace edema Neurological: A&O x3, cranial nerves 2-12 intact, normal power, tone and reflexes, normal sensations, plantar equivocal bilaterally, past pointing noted on finger nose test,impaiored alternate hand movements. Gait not assessed.   Labs on Admission:  Basic Metabolic Panel:  Recent Labs Lab 03/23/14 0230 03/23/14 1147 03/23/14 1223  NA 143 146 142  K 3.8 3.9 3.7  CL 99 103 99  CO2  --  28  --   GLUCOSE 103* 89 92  BUN 9 9 8   CREATININE 1.10 0.97 1.10  CALCIUM  --  9.8  --    Liver Function  Tests:  Recent Labs Lab 03/23/14 1147  AST 31  ALT 18  ALKPHOS 111  BILITOT 0.6  PROT 8.0  ALBUMIN 3.7   No results found for this basename: LIPASE, AMYLASE,  in the last 168 hours No results found for this basename: AMMONIA,  in the last 168 hours CBC:  Recent Labs Lab 03/23/14 0230 03/23/14 1147 03/23/14 1223  WBC  --  5.6  --   NEUTROABS  --  3.5  --   HGB 14.3 12.5 14.3  HCT 42.0 38.0 42.0  MCV  --  92.2  --   PLT  --  250  --    Cardiac Enzymes: No results found for this basename: CKTOTAL, CKMB, CKMBINDEX, TROPONINI,  in the last 168 hours BNP: No components found with this basename: POCBNP,  CBG: No results found for this basename: GLUCAP,  in the last 168 hours  Radiological Exams on Admission: Ct Head Wo Contrast  03/23/2014   CLINICAL DATA:  APHASIA  EXAM:  CT HEAD WITHOUT CONTRAST  TECHNIQUE: Contiguous axial images were obtained from the base of the skull through the vertex without intravenous contrast.  COMPARISON:  CT HEAD WO/W CM dated 01/07/2014  FINDINGS: No acute intracranial abnormality. Specifically, no hemorrhage, hydrocephalus, mass lesion, acute infarction, or significant intracranial injury. No acute calvarial abnormality. Chronic areas of lacunar infarction within the vertex of the right frontal lobe and right occipital lobe. An area of subacute to chronic infarction within the posterior periphery of the right parietal occipital region image 18 series 2. This finding occurred in the interim compared to prior. Diffuse areas of low attenuation within the subcortical, deep, and periventricular white matter regions consistent small vessel white matter ischaemia. Ventricles and cisterns are patent. Visualized paranasal sinuses and mastoid air cells are patent. There is mild diffuse cortical atrophy.  IMPRESSION: Subacute and chronic changes without evidence of acute abnormalities.  Involutional changes also appreciated.   Electronically Signed   By: Margaree Mackintosh  M.D.   On: 03/23/2014 13:18    EKG: NSR 73, no ST-T changes   Assessment/Plan      principle problem  Acute ?CVA/ TIA Admit to telemetry  neuro checks q4 hr Head CT on admission negative for acute event. Ordered MRI brain, MRA head. -CHECK 2D echo, carotid doppler, A1C and lipid panel -keep NPO. swallow eval, PT, OT. -continue plavix 75 mg daily, add statin.  patient was discharged on Lipitor following previous stroke but is not on it for reasons unclear . Allow permissive HTN -neurology consult depending upon MRI result   active problem  HTN  continue metoprolol. Will hold HCTZ for permissive HTN  Obesity  needs counseling on weight reduction and exercise   Anxiety  continue home dose xanax   Diet:cardiac  DVT prophylaxis: sq lovenox   Code Status: full code Family Communication: discussed with daughter at bedside Disposition Plan: Home once stable  Linnie Mcglocklin, [place] [facility] Pager [phone]  Total time spent on admission :70 minutes  If 7PM-7AM, please contact night-coverage [email or web address] Password [email or web address] 03/23/2014, 4:00 PM

## 2014-03-23 NOTE — ED Notes (Signed)
Spoke with Dr Zenia Resides reguarding patients behavior and anxiety difficulties at this time.

## 2014-03-23 NOTE — ED Notes (Signed)
Pt was seen here last nite for the same issue.  Pt was at an adult daycare and EMS reported pt had a 5 minute episode of where she could not find/form her words.  Pt has old stroke from 2014 that left her with left sided weakness and pt uses walker.  EMS reports symptoms have resolved except for tingling in fingers.  Pt anxious and tremors.

## 2014-03-23 NOTE — ED Notes (Signed)
Dr Cheri Guppy in room with pt.

## 2014-03-23 NOTE — ED Notes (Signed)
Spoke with patient about having an MRI, her speech became repetitive and she started crying and is wimpering.  Very Anxious.

## 2014-03-23 NOTE — ED Notes (Addendum)
Pt extremely anxious, mimics stroke symptoms but per daughter, pt becomes like this when she gets near EMS, nurses, doctors or health care facility.  Pt saying "early bird catches the worm" as she has been told by therapists to help alleviate her anxiety.  Pt to the point of tears, acts extremely fearful.  Much reassurance needed. Will wait until pt has calmed before performing NIH for more accurate results.

## 2014-03-23 NOTE — ED Notes (Signed)
Patient transported to CT 

## 2014-03-24 DIAGNOSIS — I059 Rheumatic mitral valve disease, unspecified: Secondary | ICD-10-CM

## 2014-03-24 DIAGNOSIS — E669 Obesity, unspecified: Secondary | ICD-10-CM

## 2014-03-24 DIAGNOSIS — E785 Hyperlipidemia, unspecified: Secondary | ICD-10-CM

## 2014-03-24 DIAGNOSIS — I10 Essential (primary) hypertension: Secondary | ICD-10-CM

## 2014-03-24 LAB — HEMOGLOBIN A1C
Hgb A1c MFr Bld: 6.3 % — ABNORMAL HIGH (ref <5.7)
Mean Plasma Glucose: 134 mg/dL — ABNORMAL HIGH (ref <117)

## 2014-03-24 LAB — LIPID PANEL
Cholesterol: 179 mg/dL (ref 0–200)
HDL: 38 mg/dL — ABNORMAL LOW (ref >39)
LDL Cholesterol: 125 mg/dL — ABNORMAL HIGH (ref 0–99)
TRIGLYCERIDES: 79 mg/dL (ref <150)
Total CHOL/HDL Ratio: 4.7 RATIO
VLDL: 16 mg/dL (ref 0–40)

## 2014-03-24 NOTE — Progress Notes (Signed)
SLP Cancellation Note  Patient Details Name: ANISTYN GRADDY MRN: [id number] DOB: [birth year]   Cancelled treatment:        Transport present to take pt for vascular study.  Will continue efforts.  Celia B. Quentin Ore [facility], CCC-SLP [phone] Horace 03/24/2014, 10:42 AM

## 2014-03-24 NOTE — Progress Notes (Signed)
[facility]                                                                              Patient Demographics  Deborah Whitehead, is a 63 y.o. female, DOB - [birth year], YBO:[id number]  Admit date - 03/23/2014   Admitting Physician Louellen Molder, MD  Outpatient Primary MD for the patient is Kristine Garbe, MD  LOS - 1   Chief Complaint  Patient presents with  . Aphasia        Assessment & Plan  Acute CVA -MRI of the brain: acute right cerebral hemispheric infarct -Neurology consulted and following -Pending echocardiogram, carotid Doppler -Hemoglobin A1c pending -Lipid panel: 179/79/38/125 -PT/OT/Speech consulted for evaluation and treatment -Continue atorvastatin and Plavix for secondary stroke prevention  Hypertension -Continue metoprolol -HCTZ currently held -Will allow permissive hypertension  Obesity -Will need outpatient counseling for weight reduction exercise program  Anxiety -Continue Xanax  Code Status: Full  Family Communication: None at bedside  Disposition Plan: Admitted  Time Spent in minutes   30 minutes  Procedures none  Consults  neurology  DVT Prophylaxis  Lovenox   Lab Results  Component Value Date   PLT 250 03/23/2014    Medications  Scheduled Meds: . atorvastatin  40 mg Oral q1800  . clopidogrel  75 mg Oral Q breakfast  . enoxaparin (LOVENOX) injection  40 mg Subcutaneous Q24H  . meloxicam  15 mg Oral Daily  . metoprolol  50 mg Oral Daily   Continuous Infusions: . sodium chloride 75 mL/hr at 03/23/14 2223   PRN Meds:.ALPRAZolam, ibuprofen, senna-docusate  Antibiotics    Anti-infectives   None      Subjective:   Deborah Whitehead seen and examined today.  Patient states she feels "all right".  She states she cannot find her phone.  She has noticed that her speech has improved and she is able to move her left hand and leg.  She still feels weak.    Objective:   Filed Vitals:   03/23/14 2232 03/24/14 0045  03/24/14 0240 03/24/14 0438  BP: 144/86 151/81 139/75 143/72  Pulse: 84 77 76 77  Temp: 98.2 F (36.8 C) 98.3 F (36.8 C) 98.6 F (37 C) 98.4 F (36.9 C)  TempSrc: Oral Oral Oral Oral  Resp: 18 18 18 18   Height:      Weight:      SpO2: 100% 95% 98% 99%    Wt Readings from Last 3 Encounters:  03/23/14 94.167 kg (207 lb 9.6 oz)  03/23/14 96.163 kg (212 lb)  01/19/14 97.977 kg (216 lb)     Intake/Output Summary (Last 24 hours) at 03/24/14 0802 Last data filed at 03/24/14 0500  Gross per 24 hour  Intake      0 ml  Output    300 ml  Net   -300 ml    Exam  General: Well developed, well nourished, NAD, appears stated age  HEENT: NCAT, PERRLA, EOMI, Anicteic Sclera, mucous membranes moist.   Neck: Supple, no JVD, no masses  Cardiovascular: S1 S2 auscultated, no rubs, murmurs or gallops. Regular rate and rhythm.  Respiratory: Clear to auscultation bilaterally with equal chest rise  Abdomen: Soft, nontender, nondistended, +  bowel sounds  Extremities: warm dry without cyanosis clubbing or edema  Neuro: AAOx3, cranial nerves grossly intact. Strength 4/5 in LUE and LLE, 5/5 in RUE and RLE  Skin: Without rashes exudates or nodules  Psych: Normal affect and demeanor with intact judgement and insight   Data Review   Micro Results No results found for this or any previous visit (from the past 240 hour(s)).  Radiology Reports Ct Head Wo Contrast  03/23/2014   CLINICAL DATA:  APHASIA  EXAM: CT HEAD WITHOUT CONTRAST  TECHNIQUE: Contiguous axial images were obtained from the base of the skull through the vertex without intravenous contrast.  COMPARISON:  CT HEAD WO/W CM dated 01/07/2014  FINDINGS: No acute intracranial abnormality. Specifically, no hemorrhage, hydrocephalus, mass lesion, acute infarction, or significant intracranial injury. No acute calvarial abnormality. Chronic areas of lacunar infarction within the vertex of the right frontal lobe and right occipital lobe. An  area of subacute to chronic infarction within the posterior periphery of the right parietal occipital region image 18 series 2. This finding occurred in the interim compared to prior. Diffuse areas of low attenuation within the subcortical, deep, and periventricular white matter regions consistent small vessel white matter ischaemia. Ventricles and cisterns are patent. Visualized paranasal sinuses and mastoid air cells are patent. There is mild diffuse cortical atrophy.  IMPRESSION: Subacute and chronic changes without evidence of acute abnormalities.  Involutional changes also appreciated.   Electronically Signed   By: Margaree Mackintosh M.D.   On: 03/23/2014 13:18   Mr Jodene Nam Head Wo Contrast  03/23/2014   CLINICAL DATA:  History of hypertension and prior strokes presenting with acute onset of slurred speech and left upper extremity weakness. Possible stroke.  EXAM: MRI HEAD WITHOUT CONTRAST  MRA HEAD WITHOUT CONTRAST  TECHNIQUE: Multiplanar, multiecho pulse sequences of the brain and surrounding structures were obtained without intravenous contrast. Angiographic images of the head were obtained using MRA technique without contrast.  COMPARISON:  Head CT 03/23/2014.  Head MRI and MRA 07/11/2013.  FINDINGS: MRI HEAD FINDINGS  There are multiple regions of acute infarction in the right cerebral hemisphere. The largest involvement is cortical and subcortical right parietal and occipital lobes. There is also involvement of the right frontal lobe near the vertex extending inferiorly into the centrum semiovale. Some of these regions of acute infarction are in a similar distribution to those on the prior MRI. There is a punctate focus of susceptibility artifact in the region of the right frontal operculum suggestive of a microhemorrhage. There is no other evidence of intracranial hemorrhage. Coarse calcification is noted along the left aspect of the tentorium, unchanged.  Multiple foci of T2 hyperintensity are again seen  within the subcortical and deep cerebral white matter bilaterally, compatible with moderate chronic small vessel ischemic disease. Encephalomalacia is seen in the right frontal lobe near the vertex. Chronic ischemic changes are again seen in the pons, unchanged. Remote lacunar infarcts are again noted in the basal ganglia and thalamus.  Major intracranial vascular flow voids are preserved. Prior right cataract surgery is noted. Paranasal sinuses and mastoid air cells are clear.  MRA HEAD FINDINGS  The visualized distal vertebral arteries are patent with the left being dominant. There is mild focal narrowing of the proximal right V4 segment. The basilar artery is patent without high-grade stenosis but with mild, diffuse irregularity. Moderate irregularity is again seen involving the PCAs bilaterally. There is a patent left posterior communicating artery.  Internal carotid arteries are patent from skullbase  to carotid termini. The bilateral carotid siphon a regular narrowing is again seen, greater on the right with moderate narrowing of the supraclinoid right ICA, similar to prior. ACA and MCA origins are patent. Moderate, diffuse ACA and MCA irregularity is again seen and is similar to prior.  IMPRESSION: 1. Acute right cerebral hemispheric infarcts as above. Findings could reflect watershed ischemia or possibly emboli. 2. Moderate chronic small vessel ischemic disease and remote infarcts as above. 3. No evidence of major intracranial arterial occlusion. Overall similar appearance of widespread, severe intracranial atherosclerotic disease.   Electronically Signed   By: Logan Bores   On: 03/23/2014 19:17   Mr Brain Wo Contrast  03/23/2014   CLINICAL DATA:  History of hypertension and prior strokes presenting with acute onset of slurred speech and left upper extremity weakness. Possible stroke.  EXAM: MRI HEAD WITHOUT CONTRAST  MRA HEAD WITHOUT CONTRAST  TECHNIQUE: Multiplanar, multiecho pulse sequences of the brain  and surrounding structures were obtained without intravenous contrast. Angiographic images of the head were obtained using MRA technique without contrast.  COMPARISON:  Head CT 03/23/2014.  Head MRI and MRA 07/11/2013.  FINDINGS: MRI HEAD FINDINGS  There are multiple regions of acute infarction in the right cerebral hemisphere. The largest involvement is cortical and subcortical right parietal and occipital lobes. There is also involvement of the right frontal lobe near the vertex extending inferiorly into the centrum semiovale. Some of these regions of acute infarction are in a similar distribution to those on the prior MRI. There is a punctate focus of susceptibility artifact in the region of the right frontal operculum suggestive of a microhemorrhage. There is no other evidence of intracranial hemorrhage. Coarse calcification is noted along the left aspect of the tentorium, unchanged.  Multiple foci of T2 hyperintensity are again seen within the subcortical and deep cerebral white matter bilaterally, compatible with moderate chronic small vessel ischemic disease. Encephalomalacia is seen in the right frontal lobe near the vertex. Chronic ischemic changes are again seen in the pons, unchanged. Remote lacunar infarcts are again noted in the basal ganglia and thalamus.  Major intracranial vascular flow voids are preserved. Prior right cataract surgery is noted. Paranasal sinuses and mastoid air cells are clear.  MRA HEAD FINDINGS  The visualized distal vertebral arteries are patent with the left being dominant. There is mild focal narrowing of the proximal right V4 segment. The basilar artery is patent without high-grade stenosis but with mild, diffuse irregularity. Moderate irregularity is again seen involving the PCAs bilaterally. There is a patent left posterior communicating artery.  Internal carotid arteries are patent from skullbase to carotid termini. The bilateral carotid siphon a regular narrowing is again  seen, greater on the right with moderate narrowing of the supraclinoid right ICA, similar to prior. ACA and MCA origins are patent. Moderate, diffuse ACA and MCA irregularity is again seen and is similar to prior.  IMPRESSION: 1. Acute right cerebral hemispheric infarcts as above. Findings could reflect watershed ischemia or possibly emboli. 2. Moderate chronic small vessel ischemic disease and remote infarcts as above. 3. No evidence of major intracranial arterial occlusion. Overall similar appearance of widespread, severe intracranial atherosclerotic disease.   Electronically Signed   By: Logan Bores   On: 03/23/2014 19:17    CBC  Recent Labs Lab 03/23/14 0230 03/23/14 1147 03/23/14 1223  WBC  --  5.6  --   HGB 14.3 12.5 14.3  HCT 42.0 38.0 42.0  PLT  --  250  --  MCV  --  92.2  --   MCH  --  30.3  --   MCHC  --  32.9  --   RDW  --  13.3  --   LYMPHSABS  --  1.6  --   MONOABS  --  0.4  --   EOSABS  --  0.1  --   BASOSABS  --  0.0  --     Chemistries   Recent Labs Lab 03/23/14 0230 03/23/14 1147 03/23/14 1223  NA 143 146 142  K 3.8 3.9 3.7  CL 99 103 99  CO2  --  28  --   GLUCOSE 103* 89 92  BUN 9 9 8   CREATININE 1.10 0.97 1.10  CALCIUM  --  9.8  --   AST  --  31  --   ALT  --  18  --   ALKPHOS  --  111  --   BILITOT  --  0.6  --    ------------------------------------------------------------------------------------------------------------------ estimated creatinine clearance is 60.2 ml/min (by C-G formula based on Cr of 1.1). ------------------------------------------------------------------------------------------------------------------ No results found for this basename: HGBA1C,  in the last 72 hours ------------------------------------------------------------------------------------------------------------------  Recent Labs  03/24/14 0510  CHOL 179  HDL 38*  LDLCALC 125*  TRIG 79  CHOLHDL 4.7    ------------------------------------------------------------------------------------------------------------------ No results found for this basename: TSH, T4TOTAL, FREET3, T3FREE, THYROIDAB,  in the last 72 hours ------------------------------------------------------------------------------------------------------------------ No results found for this basename: VITAMINB12, FOLATE, FERRITIN, TIBC, IRON, RETICCTPCT,  in the last 72 hours  Coagulation profile  Recent Labs Lab 03/23/14 1147  INR 1.06    No results found for this basename: DDIMER,  in the last 72 hours  Cardiac Enzymes No results found for this basename: CK, CKMB, TROPONINI, MYOGLOBIN,  in the last 168 hours ------------------------------------------------------------------------------------------------------------------ No components found with this basename: POCBNP,     Saryna Kneeland D.O. on 03/24/2014 at 8:02 AM  Between 7am to 7pm - Pager - [phone]  After 7pm go to [email or web address] - password [email or web address]  And look for the night coverage person covering for me after hours  [facility] Office  [phone]

## 2014-03-24 NOTE — Evaluation (Addendum)
Occupational Therapy Evaluation Patient Details Name: Deborah Whitehead MRN: [id number] DOB: 07/17/1951 Today's Date: 03/24/2014    History of Present Illness 63 y.o. s/p Acute right cerebral hemispheric infarcts. The largest involvement is cortical and subcortical  right parietal and occipital lobes    Clinical Impression   Pt presents with below problem list. Feel pt will benefit from acute OT to increase independence prior to d/c. Recommending SNF for additional rehab prior to d/c home.     Follow Up Recommendations  SNF;Supervision/Assistance - 24 hour    Equipment Recommendations  Other (comment) (defer to next venue)    Recommendations for Other Services       Precautions / Restrictions Precautions Precautions: Fall Restrictions Weight Bearing Restrictions: No      Mobility Bed Mobility Overal bed mobility: Needs Assistance Bed Mobility: Supine to Sit     Supine to sit: Mod assist     General bed mobility comments: Assistance with trunk; cues for technique.  Transfers Overall transfer level: Needs assistance Equipment used: Rolling walker (2 wheeled) Transfers: Sit to/from Stand Sit to Stand: Mod assist;Max assist;Min assist         General transfer comment: Varying levels of assist for sit to stand transfer. Cues for technique.    Balance                                            ADL Overall ADL's : Needs assistance/impaired     Grooming: Wash/dry face;Brushing hair;Oral care;Standing;Minimal assistance           Upper Body Dressing : Minimal assistance;Sitting   Lower Body Dressing: With adaptive equipment;Sit to/from stand;Moderate assistance   Toilet Transfer: Moderate assistance;Ambulation;Comfort height toilet;Grab bars;RW   Toileting- Clothing Manipulation and Hygiene: Minimal assistance;Sit to/from stand       Functional mobility during ADLs: Moderate assistance;Rolling walker;Cueing for sequencing General  ADL Comments: Difficulty with socks, so pt practiced with sockaid for LB dressing and states she uses one at home, however required assistance to don sock on correct side of sockaid. Pt performed grooming at sink.  Educated on dressing technique. Cues required as pt was holding toothpaste in left hand and going to sit down before placing in on counter. Encouraged pt to be using left hand.     Vision  Not tested                   Perception     Praxis      Pertinent Vitals/Pain Pain in RLE at times. Increased activity. Pt back in bed at end of session.      Hand Dominance Right   Extremity/Trunk Assessment Upper Extremity Assessment Upper Extremity Assessment: LUE deficits/detail LUE Deficits / Details: approximately 90 degrees AROM shoulder flexion. LUE Coordination: decreased fine motor   Lower Extremity Assessment Lower Extremity Assessment: Defer to PT evaluation       Communication Communication Communication: No difficulties   Cognition Arousal/Alertness: Awake/alert Behavior During Therapy: Anxious Overall Cognitive Status: No family/caregiver present to determine baseline cognitive functioning                     General Comments       Exercises       Shoulder Instructions      Home Living Family/patient expects to be discharged to:: Private residence Living Arrangements: Children Available Help at  Discharge: Family;Available PRN/intermittently (daughter works) Type of Home: Apartment Home Access: Stairs to enter Technical brewer of Steps: 10-12 Entrance Stairs-Rails: Left Home Layout: One level     Bathroom Shower/Tub: Tub/shower unit;Curtain Shower/tub characteristics: Architectural technologist: Standard     Home Equipment: Environmental consultant - 2 wheels;Tub bench; reacher;sockaid          Prior Functioning/Environment Level of Independence: Independent with assistive device(s)             OT Diagnosis: Other (comment);Cognitive  deficits (weakness on left side)   OT Problem List: Decreased strength;Decreased coordination;Impaired balance (sitting and/or standing);Decreased cognition;Decreased knowledge of use of DME or AE;Pain;Impaired UE functional use;Decreased knowledge of precautions   OT Treatment/Interventions: Self-care/ADL training;DME and/or AE instruction;Therapeutic activities;Patient/family education;Balance training;Cognitive remediation/compensation;Neuromuscular education    OT Goals(Current goals can be found in the care plan section) Acute Rehab OT Goals Patient Stated Goal: not stated OT Goal Formulation: With patient Time For Goal Achievement: 03/31/14 Potential to Achieve Goals: Good ADL Goals Pt Will Perform Grooming: with supervision;standing;with set-up Pt Will Perform Lower Body Dressing: sit to/from stand;with adaptive equipment;with set-up;with supervision Pt Will Transfer to Toilet: with min guard assist;ambulating (3 in 1 over commode) Pt Will Perform Toileting - Clothing Manipulation and hygiene: with supervision;sit to/from stand Additional ADL Goal #1: Pt will perform bed mobility at supervision level as precursor for ADLs.   OT Frequency: Min 2X/week   Barriers to D/C:  decreased caregiver support          Co-evaluation              End of Session Equipment Utilized During Treatment: Gait belt;Rolling walker Nurse Communication: Mobility status  Activity Tolerance: Patient tolerated treatment well Patient left: in bed;Other (comment) (with transporter to go for test)   Time: 7078-6754 OT Time Calculation (min): 32 min Charges:  OT General Charges $OT Visit: 1 Procedure OT Evaluation $Initial OT Evaluation Tier I: 1 Procedure OT Treatments $Self Care/Home Management : 8-22 mins G-Codes:    Benito Mccreedy OTR/L [phone] 03/24/2014, 1:04 PM

## 2014-03-24 NOTE — Progress Notes (Signed)
   CARE MANAGEMENT NOTE 03/24/2014  Patient:  Deborah Whitehead, Deborah Whitehead   Account Number:  [id number]  Date Initiated:  03/24/2014  Documentation initiated by:  Olga Coaster  Subjective/Objective Assessment:   ADMITTED FOR STROKE WORKUP     Action/Plan:   CM FOLLOWING FOR DCP   Anticipated DC Date:  03/27/2014   Anticipated DC Plan:  SNF      DC Planning Services  CM consult/ SW REFERRAL PLACED         Status of service:  In process, will continue to follow Medicare Important Message given?  NA - LOS <3 / Initial given by admissions (If response is "NO", the following Medicare IM given date fields will be blank)  Per UR Regulation:  Reviewed for med. necessity/level of care/duration of stay  Comments:  4/8/2015Mindi Slicker RN,BSN,MHA [phone]

## 2014-03-24 NOTE — Evaluation (Signed)
Speech Language Pathology Evaluation Patient Details Name: Deborah Whitehead MRN: [id number] DOB: 19-Feb-1951 Today's Date: 03/24/2014 Time: 1330-1400 SLP Time Calculation (min): 30 min  Problem List:  Patient Active Problem List   Diagnosis Date Noted  . TIA (transient ischemic attack) 03/23/2014  . Anxiety 03/23/2014  . CVA (cerebral vascular accident) 07/15/2013  . Obesity, unspecified 07/13/2013  . Other and unspecified hyperlipidemia 07/13/2013  . Cigarette smoker 07/13/2013  . Stroke 07/10/2013  . HTN (hypertension) 07/10/2013  . Abdominal pain, unspecified site 05/13/2013   Past Medical History:  Past Medical History  Diagnosis Date  . Anxiety   . Arthritis   . Substance abuse   . Hypertension     dr Ayesha Rumpf     [facility] fp  . Shortness of breath   . GERD (gastroesophageal reflux disease)   . Depression   . Fibroids     abd  . Stroke     memory loss   Past Surgical History:  Past Surgical History  Procedure Laterality Date  . Cesarean section    . Cholecystectomy    . Abdominal hysterectomy    . Ventral hernia repair N/A 06/25/2013    Procedure: LAPAROSCOPIC VENTRAL HERNIA;  Surgeon: Merrie Roof, MD;  Location: [place];  Service: General;  Laterality: N/A;  . Insertion of mesh N/A 06/25/2013    Procedure: INSERTION OF MESH;  Surgeon: Merrie Roof, MD;  Location: [facility];  Service: General;  Laterality: N/A;   HPI:  63 year old female admitted 03/23/14 due to slurred speech and left sided weakness. Pt went to ED 4/6 and was DC'd home, then returned 4/7 and was admitted.  PMH significant for Right CVA (2014), GERD, substance abuse.  Pt passed RN stroke swallow screen.  SLE ordered to evaluate comm/cog   Assessment / Plan / Recommendation Clinical Impression  The Montreal Cognitive Assessment (MoCA) was administered.  Overall score, given additional point for high school level of education, was 22/30, indicating mild cognitive deficits (n=>25/30).  Difficulty was  noted on alternating trail making task, clock drawing, serial subtraction, and delayed recall.  Pt reports she goes to a group facility [facility] from [facility]-2 each day, and is with her daughter the remainder of the day/evening/night.  Pt's daughter was not present to discuss current deficits, and whether they are new or baseline.  Continued skilled ST intervention is recommended at the next venue to facilitate continued safety and independence.    SLP Assessment  All further Speech Lanaguage Pathology  needs can be addressed in the next venue of care    Follow Up Recommendations  24 hour supervision/assistance    Frequency and Duration        Pertinent Vitals/Pain VSS, no pain reported   SLP Goals   n/a  SLP Evaluation Prior Functioning  Cognitive/Linguistic Baseline: Information not available Type of Home: Apartment  Lives With: Daughter Available Help at Discharge: Family;Available PRN/intermittently Education: high school graduate Vocation: Unemployed   Cognition  Overall Cognitive Status: No family/caregiver present to determine baseline cognitive functioning Arousal/Alertness: Awake/alert Orientation Level: Oriented X4 Comments: Pt scored 22/30 on MoCA (n=>25/30) Difficulty with visuospatial/executive tasks of alternating trail and clock drawing, serial 7 subtraction, and delayed recall.      Comprehension  Auditory Comprehension Overall Auditory Comprehension: Appears within functional limits for tasks assessed    Expression Expression Primary Mode of Expression: Verbal Verbal Expression Overall Verbal Expression: Appears within functional limits for tasks assessed Written Expression  Dominant Hand: Right   Oral / Motor Oral Motor/Sensory Function Overall Oral Motor/Sensory Function: Appears within functional limits for tasks assessed Motor Speech Overall Motor Speech: Appears within functional limits for tasks assessed   GO   Claudette Wermuth B. Quentin Ore [facility],  [place] 03/24/2014, 2:41 PM

## 2014-03-24 NOTE — Progress Notes (Signed)
Stroke Team Progress Note  HISTORY Deborah Whitehead is a 63 y.o. female with a history of hypertension who presented to the emergency room 03/23/2014 at 0137 with transient left hand dysfunction. She had abnormal behavior in the emergency room, but her daughter stated that this was normal because the patient's her from extreme anxiety when near doctors. She was diagnosed with TIA versus anxiety and discharged from the emergency room.  She again had more difficulty with left side weakness this morning 03/23/2014 and has returned at 1138. She had an MRI which shows right posterior stroke. She is unfortunately unable to give me much in the way of history due to having been sedated with Ativan for her MRI.  She was last known well 03/22/2014 in the afternoon. Patient was not administered TPA secondary to unknown time of onset, delay in arrival. She was admitted for further evaluation and treatment.  SUBJECTIVE No family is at the bedside.  Overall she feels her condition is stable. She is tearful at the thought of having a stroke, new diabetes, or any other medical illness.  OBJECTIVE Most recent Vital Signs: Filed Vitals:   03/23/14 2232 03/24/14 0045 03/24/14 0240 03/24/14 0438  BP: 144/86 151/81 139/75 143/72  Pulse: 84 77 76 77  Temp: 98.2 F (36.8 C) 98.3 F (36.8 C) 98.6 F (37 C) 98.4 F (36.9 C)  TempSrc: Oral Oral Oral Oral  Resp: 18 18 18 18   Height:      Weight:      SpO2: 100% 95% 98% 99%   CBG (last 3)  No results found for this basename: GLUCAP,  in the last 72 hours  IV Fluid Intake:   . sodium chloride 75 mL/hr at 03/23/14 2223    MEDICATIONS  . atorvastatin  40 mg Oral q1800  . clopidogrel  75 mg Oral Q breakfast  . enoxaparin (LOVENOX) injection  40 mg Subcutaneous Q24H  . meloxicam  15 mg Oral Daily  . metoprolol  50 mg Oral Daily   PRN:  ALPRAZolam, ibuprofen, senna-docusate  Diet:  Cardiac thin liquids Activity:  Bedrest, OOB  with assistance DVT Prophylaxis:   Lovenox 40 mg sq daily   CLINICALLY SIGNIFICANT STUDIES Basic Metabolic Panel:   Recent Labs Lab 03/23/14 1147 03/23/14 1223  NA 146 142  K 3.9 3.7  CL 103 99  CO2 28  --   GLUCOSE 89 92  BUN 9 8  CREATININE 0.97 1.10  CALCIUM 9.8  --    Liver Function Tests:   Recent Labs Lab 03/23/14 1147  AST 31  ALT 18  ALKPHOS 111  BILITOT 0.6  PROT 8.0  ALBUMIN 3.7   CBC:   Recent Labs Lab 03/23/14 1147 03/23/14 1223  WBC 5.6  --   NEUTROABS 3.5  --   HGB 12.5 14.3  HCT 38.0 42.0  MCV 92.2  --   PLT 250  --    Coagulation:   Recent Labs Lab 03/23/14 1147  LABPROT 13.6  INR 1.06   Cardiac Enzymes: No results found for this basename: CKTOTAL, CKMB, CKMBINDEX, TROPONINI,  in the last 168 hours Urinalysis:   Recent Labs Lab 03/23/14 1323  COLORURINE YELLOW  LABSPEC 1.013  PHURINE 6.5  GLUCOSEU NEGATIVE  HGBUR MODERATE*  BILIRUBINUR NEGATIVE  KETONESUR 15*  PROTEINUR NEGATIVE  UROBILINOGEN 1.0  NITRITE NEGATIVE  LEUKOCYTESUR MODERATE*   Lipid Panel    Component Value Date/Time   CHOL 179 03/24/2014 0510   TRIG 79 03/24/2014 0510  HDL 38* 03/24/2014 0510   HDL 43 01/21/2014 1023   CHOLHDL 4.7 03/24/2014 0510   CHOLHDL 4.5* 01/21/2014 1023   VLDL 16 03/24/2014 0510   LDLCALC 125* 03/24/2014 0510   LDLCALC 138* 01/21/2014 1023   HgbA1C  Lab Results  Component Value Date   HGBA1C 7.1* 01/21/2014    Urine Drug Screen:     Component Value Date/Time   LABOPIA NONE DETECTED 03/23/2014 1323   COCAINSCRNUR NONE DETECTED 03/23/2014 1323   LABBENZ NONE DETECTED 03/23/2014 1323   AMPHETMU NONE DETECTED 03/23/2014 1323   THCU NONE DETECTED 03/23/2014 1323   LABBARB NONE DETECTED 03/23/2014 1323    Alcohol Level:   Recent Labs Lab 03/23/14 1147  ETH <11    CT of the brain  03/23/2014    Subacute and chronic changes without evidence of acute abnormalities.  Involutional changes also appreciated.     MRI of the brain  03/23/2014   1. Acute right cerebral hemispheric infarcts as  above. Findings could reflect watershed ischemia or possibly emboli. 2. Moderate chronic small vessel ischemic disease and remote infarcts  MRA of the brain  03/23/2014    No evidence of major intracranial arterial occlusion. Overall similar appearance of widespread, severe intracranial atherosclerotic disease.   2D Echocardiogram    Carotid Doppler    CXR    EKG  normal sinus rhythm. For complete results please see formal report.   Therapy Recommendations   Physical Exam   Middle aged obese lady not in distress.Awake alert. Afebrile. Head is nontraumatic. Neck is supple without bruit. Hearing is normal. Cardiac exam no murmur or gallop. Lungs are clear to auscultation. Distal pulses are well felt. Neurological Exam : Awake alert oriented x 3 normal speech and language. Mild left lower face asymmetry. Tongue midline. No drift. Mild diminished fine finger movements on left. Orbits right over left upper extremity. Mild left grip weak.. Normal sensation . Normal coordination. ASSESSMENT Deborah Whitehead is a 63 y.o. female presenting with transient left hand dysfunction. Imaging confirms a right parietal and occipital, cortical and subcortical infarcts. Infarcts either secondary to right ICA disease or embolic from other unknown source.  On clopidogrel 75 mg orally every day prior to admission. Now on clopidogrel 75 mg orally every day for secondary stroke prevention. Stroke work up underway.  hypertension  Hyperlipidemia, LDL 125, on no statin PTA, now on lipitor 40 mg daily, goal LDL < 100 (< 70 for diabetics) Hyperglycemia, HgbA1c 7.1, likely new dx diabetes Obesity, Body mass index is 34.55 kg/(m^2).  anxiety  Hospital day # 1  TREATMENT/PLAN  Continue clopidogrel 75 mg orally every day for secondary stroke prevention.  Follow up carotid doppler. If R ICA stenosis significant, will be VVS consult. If carotid doppler negative, will need TEE and loop scheduled for  tomorrow.  Complete stroke workup - 2D, carotid doppler  Therapy evals  Burnetta Sabin, MSN, RN, ANVP-BC, AGPCNP-BC [facility] Pager: [phone] 03/24/2014 9:55 AM  I have personally obtained a history, examined the patient, evaluated imaging results, and formulated the assessment and plan of care. I agree with the above. Antony Contras, MD   To contact Stroke Continuity provider, please refer to [email or web address] After hours, contact General Neurology

## 2014-03-24 NOTE — Progress Notes (Signed)
Echo Lab  2D Echocardiogram completed.  [name] Alanni Vader, RDCS 03/24/2014 11:17 AM

## 2014-03-24 NOTE — Progress Notes (Signed)
VASCULAR LAB PRELIMINARY  PRELIMINARY  PRELIMINARY  PRELIMINARY  Carotid duplex  completed.    Preliminary report:  Bilateral:  1-39% ICA stenosis.  Vertebral artery flow is antegrade.      Nani Ravens, RVT 03/24/2014, 11:19 AM

## 2014-03-24 NOTE — Evaluation (Signed)
Physical Therapy Evaluation Patient Details Name: Deborah Whitehead MRN: [id number] DOB: May 18, 1951 Today's Date: 03/24/2014   History of Present Illness  63 y.o. s/p Acute right cerebral hemispheric infarcts  Clinical Impression  Pt with L sided weakness and requires A for all mobility.  Feel SNF would be safest D/C option for pt at this time.  Will continue to follow.      Follow Up Recommendations SNF    Equipment Recommendations  None recommended by PT    Recommendations for Other Services       Precautions / Restrictions Precautions Precautions: Fall Restrictions Weight Bearing Restrictions: No      Mobility  Bed Mobility Overal bed mobility: Needs Assistance Bed Mobility: Supine to Sit     Supine to sit: Mod assist     General bed mobility comments: Assistance with trunk; cues for technique.  Transfers Overall transfer level: Needs assistance Equipment used: Rolling walker (2 wheeled) Transfers: Sit to/from Stand Sit to Stand: Mod assist         General transfer comment: cues for UE use and A with anterior wt shifting when coming to stand.    Ambulation/Gait Ambulation/Gait assistance: Min assist;Mod assist Ambulation Distance (Feet): 30 Feet Assistive device: Rolling walker (2 wheeled) Gait Pattern/deviations: Step-through pattern;Decreased stride length;Decreased step length - right;Decreased stance time - left;Drifts right/left;Trunk flexed   Gait velocity interpretation: Below normal speed for age/gender General Gait Details: pt initially MinA for amb, but progresses to [place] as she fatigues.  pt begins to leans to L side with L knee buckling during amb.  pt not good at judging fatigue level and was displaying L LE weakness prior to verbalizing fatigue.    Stairs            Wheelchair Mobility    Modified Rankin (Stroke Patients Only) Modified Rankin (Stroke Patients Only) Pre-Morbid Rankin Score: Moderately severe disability Modified  Rankin: Moderately severe disability     Balance Overall balance assessment: Needs assistance         Standing balance support: Bilateral upper extremity supported Standing balance-Leahy Scale: Poor                               Pertinent Vitals/Pain Denied pain.      Home Living Family/patient expects to be discharged to:: Private residence Living Arrangements: Children Available Help at Discharge: Family;Available PRN/intermittently (daughter works) Type of Home: Apartment Home Access: Stairs to enter Entrance Stairs-Rails: Left Entrance Stairs-Number of Steps: 10-12 Home Layout: One level Home Equipment: Environmental consultant - 2 wheels;Tub bench      Prior Function Level of Independence: Independent with assistive device(s)               Hand Dominance   Dominant Hand: Right    Extremity/Trunk Assessment   Upper Extremity Assessment: Defer to OT evaluation       LUE Deficits / Details: approximately 90 degrees AROM shoulder flexion.   Lower Extremity Assessment: LLE deficits/detail   LLE Deficits / Details: Weak throughout LE.       Communication   Communication: No difficulties  Cognition Arousal/Alertness: Awake/alert Behavior During Therapy: Anxious Overall Cognitive Status: No family/caregiver present to determine baseline cognitive functioning                      General Comments      Exercises        Assessment/Plan  PT Assessment Patient needs continued PT services  PT Diagnosis Difficulty walking;Hemiplegia non-dominant side   PT Problem List Decreased strength;Decreased activity tolerance;Decreased balance;Decreased mobility;Decreased coordination;Decreased cognition;Decreased knowledge of use of DME;Decreased safety awareness  PT Treatment Interventions DME instruction;Gait training;Functional mobility training;Therapeutic activities;Therapeutic exercise;Balance training;Neuromuscular re-education;Cognitive  remediation;Patient/family education   PT Goals (Current goals can be found in the Care Plan section) Acute Rehab PT Goals Patient Stated Goal: not stated PT Goal Formulation: With patient Time For Goal Achievement: 04/07/14 Potential to Achieve Goals: Good    Frequency Min 4X/week   Barriers to discharge        Co-evaluation               End of Session Equipment Utilized During Treatment: Gait belt Activity Tolerance: Patient limited by fatigue Patient left: in bed;with call bell/phone within reach Nurse Communication: Mobility status         Time: 1143-1202 PT Time Calculation (min): 19 min   Charges:   PT Evaluation $Initial PT Evaluation Tier I: 1 Procedure PT Treatments $Gait Training: 8-22 mins   PT G CodesCatarina Whitehead, [place]  [phone] 03/24/2014, 3:46 PM

## 2014-03-24 NOTE — Progress Notes (Signed)
Note carotid doppler without significant stenosis, therefore, not the etiology of her stroke. Stroke felt to be embolic. Have ordered: TEE to look for embolic source. Arranged with [place] for tomorrow.  If positive for PFO (patent foramen ovale), check bilateral lower extremity venous dopplers to rule out DVT as possible source of stroke. (I have made patient NPO after midnight tonight).  If TEE negative, a [facility] electrophysiologist will consult and consider placement of an place implantable loop recorder to evaluate for atrial fibrillation as etiology of stroke. This has been explained to patient/family by Dr. Leonie Man and they are agreeable.  Burnetta Sabin, MSN, RN, ANVP-BC, ANP-BC, [facility] Pager: [phone] 03/24/2014 2:02 PM  I have personally obtained a history, examined the patient, evaluated imaging results, and formulated the assessment and plan of care. I agree with the above.  Antony Contras, MD

## 2014-03-25 ENCOUNTER — Encounter (HOSPITAL_COMMUNITY): Payer: Self-pay | Admitting: *Deleted

## 2014-03-25 ENCOUNTER — Encounter (HOSPITAL_COMMUNITY)
Admission: EM | Disposition: A | Discharge status: Skilled Nursing Facility | Payer: Self-pay | Source: Home / Self Care | Attending: Internal Medicine

## 2014-03-25 DIAGNOSIS — G459 Transient cerebral ischemic attack, unspecified: Secondary | ICD-10-CM

## 2014-03-25 DIAGNOSIS — I6350 Cerebral infarction due to unspecified occlusion or stenosis of unspecified cerebral artery: Secondary | ICD-10-CM

## 2014-03-25 DIAGNOSIS — I059 Rheumatic mitral valve disease, unspecified: Secondary | ICD-10-CM

## 2014-03-25 HISTORY — PX: TEE WITHOUT CARDIOVERSION: SHX5443

## 2014-03-25 SURGERY — ECHOCARDIOGRAM, TRANSESOPHAGEAL
Anesthesia: Moderate Sedation

## 2014-03-25 MED ORDER — BUTAMBEN-TETRACAINE-BENZOCAINE 2-2-14 % EX AERO
INHALATION_SPRAY | CUTANEOUS | Status: DC | PRN
  Administered 2014-03-25: 2 via TOPICAL

## 2014-03-25 MED ORDER — FENTANYL CITRATE 0.05 MG/ML IJ SOLN
INTRAMUSCULAR | Status: DC | PRN
  Administered 2014-03-25 (×4): 25 ug via INTRAVENOUS

## 2014-03-25 MED ORDER — SODIUM CHLORIDE 0.9 % IV SOLN
INTRAVENOUS | Status: DC
  Administered 2014-03-25: 500 mL via INTRAVENOUS

## 2014-03-25 MED ORDER — LURASIDONE HCL 40 MG PO TABS
20.0000 mg | ORAL_TABLET | Freq: Every day | ORAL | Status: DC
  Administered 2014-03-26: 20 mg via ORAL
  Filled 2014-03-25 (×5): qty 1

## 2014-03-25 MED ORDER — FENTANYL CITRATE 0.05 MG/ML IJ SOLN
INTRAMUSCULAR | Status: AC
  Filled 2014-03-25: qty 2

## 2014-03-25 MED ORDER — MIDAZOLAM HCL 10 MG/2ML IJ SOLN
INTRAMUSCULAR | Status: DC | PRN
  Administered 2014-03-25 (×3): 1 mg via INTRAVENOUS
  Administered 2014-03-25: 2 mg via INTRAVENOUS
  Administered 2014-03-25: 1 mg via INTRAVENOUS
  Administered 2014-03-25: 2 mg via INTRAVENOUS

## 2014-03-25 MED ORDER — LORAZEPAM 2 MG/ML IJ SOLN
0.5000 mg | Freq: Once | INTRAMUSCULAR | Status: AC
  Administered 2014-03-25: 0.5 mg via INTRAVENOUS
  Filled 2014-03-25: qty 1

## 2014-03-25 MED ORDER — MIDAZOLAM HCL 5 MG/ML IJ SOLN
INTRAMUSCULAR | Status: AC
Start: 2014-03-25 — End: 2014-03-25
  Filled 2014-03-25: qty 2

## 2014-03-25 NOTE — Progress Notes (Signed)
[facility]                                                                              Patient Demographics  Deborah Whitehead, is a 63 y.o. female, DOB - 1951-09-29, WRU:[id number]  Admit date - 03/23/2014   Admitting Physician Louellen Molder, MD  Outpatient Primary MD for the patient is Kristine Garbe, MD  LOS - 2   Chief Complaint  Patient presents with  . Aphasia        Assessment & Plan  Acute CVA -MRI of the brain: acute right cerebral hemispheric infarct, possibly embolic -Neurology consulted and following -Echo: 55-60%, no source of embolus, grade 1 diastolic dysfunction -Carotid doppler: No evidence of hemodynamically significant internal carotid artery stenosis. Vertebral artery flow is antegrade -Hemoglobin A1c 6.3 -Lipid panel: 179/79/38/125, on atorvastatin -PT/OT/Speech consulted for evaluation and treatment -Continue atorvastatin and Plavix for secondary stroke prevention -patient scheduled for TEE today  Hypertension -Continue metoprolol -HCTZ currently held -Will allow permissive hypertension  Obesity -Will need outpatient counseling for weight reduction exercise program  Anxiety -Continue Xanax  Code Status: Full  Family Communication: None at bedside  Disposition Plan: Admitted, TEE scheduled for today  Time Spent in minutes   25 minutes  Procedures  2D echocardiogram (TTE) Study Conclusions  - Left ventricle: The cavity size was normal. There was mild concentric hypertrophy. Systolic function was normal. The estimated ejection fraction was in the range of 55% to 60%. Wall motion was normal; there were no regional wall motion abnormalities. There was an increased relative contribution of atrial contraction to ventricular filling. Doppler parameters are consistent with abnormal left ventricular relaxation (grade 1 diastolic dysfunction). - Aortic valve: Moderate focal thickening and calcification, consistent with sclerosis. - Mitral  valve: Mild regurgitation.  Carotid Doppler Summary:  The vertebral arteries appear patent with antegrade flow. Findings consistent with 1-39 percent stenosis involving the right internal carotid artery and the left internal carotid artery.  Consults  neurology  DVT Prophylaxis  Lovenox   Lab Results  Component Value Date   PLT 250 03/23/2014    Medications  Scheduled Meds: . atorvastatin  40 mg Oral q1800  . clopidogrel  75 mg Oral Q breakfast  . enoxaparin (LOVENOX) injection  40 mg Subcutaneous Q24H  . meloxicam  15 mg Oral Daily  . metoprolol  50 mg Oral Daily   Continuous Infusions: . sodium chloride 75 mL/hr at 03/23/14 2223   PRN Meds:.ALPRAZolam, ibuprofen, senna-docusate  Antibiotics    Anti-infectives   None      Subjective:   Deborah Whitehead seen and examined today.  Patient states her speech has improved.  She is very tearful this morning and worried about her TEE.    Objective:   Filed Vitals:   03/24/14 1432 03/24/14 1848 03/24/14 2118 03/25/14 0528  BP: 137/88 147/74 138/74 151/73  Pulse: 67 71 82 70  Temp: 98.4 F (36.9 C) 98.4 F (36.9 C) 98.6 F (37 C) 98.4 F (36.9 C)  TempSrc: Oral Oral Oral Oral  Resp: 18 18  18   Height:      Weight:      SpO2: 99% 100% 99% 90%  Wt Readings from Last 3 Encounters:  03/23/14 94.167 kg (207 lb 9.6 oz)  03/23/14 96.163 kg (212 lb)  01/19/14 97.977 kg (216 lb)     Intake/Output Summary (Last 24 hours) at 03/25/14 1025 Last data filed at 03/24/14 1800  Gross per 24 hour  Intake    360 ml  Output    100 ml  Net    260 ml    Exam  General: Well developed, well nourished, NAD, appears stated age  HEENT: NCAT, PERRLA, EOMI, Anicteic Sclera, mucous membranes moist.   Neck: Supple, no JVD, no masses  Cardiovascular: S1 S2 auscultated, no rubs, murmurs or gallops. Regular rate and rhythm.  Respiratory: Clear to auscultation bilaterally with equal chest rise  Abdomen: Soft, nontender,  nondistended, + bowel sounds  Extremities: warm dry without cyanosis clubbing or edema  Neuro: AAOx3, Normal speech, some facial asymmetry. Left hand grip weak as compared to right  Skin: Without rashes exudates or nodules  Psych: Anxious  Data Review   Micro Results No results found for this or any previous visit (from the past 240 hour(s)).  Radiology Reports Ct Head Wo Contrast  03/23/2014   CLINICAL DATA:  APHASIA  EXAM: CT HEAD WITHOUT CONTRAST  TECHNIQUE: Contiguous axial images were obtained from the base of the skull through the vertex without intravenous contrast.  COMPARISON:  CT HEAD WO/W CM dated 01/07/2014  FINDINGS: No acute intracranial abnormality. Specifically, no hemorrhage, hydrocephalus, mass lesion, acute infarction, or significant intracranial injury. No acute calvarial abnormality. Chronic areas of lacunar infarction within the vertex of the right frontal lobe and right occipital lobe. An area of subacute to chronic infarction within the posterior periphery of the right parietal occipital region image 18 series 2. This finding occurred in the interim compared to prior. Diffuse areas of low attenuation within the subcortical, deep, and periventricular white matter regions consistent small vessel white matter ischaemia. Ventricles and cisterns are patent. Visualized paranasal sinuses and mastoid air cells are patent. There is mild diffuse cortical atrophy.  IMPRESSION: Subacute and chronic changes without evidence of acute abnormalities.  Involutional changes also appreciated.   Electronically Signed   By: Deborah Whitehead M.D.   On: 03/23/2014 13:18   Mr Deborah Whitehead Head Wo Contrast  03/23/2014   CLINICAL DATA:  History of hypertension and prior strokes presenting with acute onset of slurred speech and left upper extremity weakness. Possible stroke.  EXAM: MRI HEAD WITHOUT CONTRAST  MRA HEAD WITHOUT CONTRAST  TECHNIQUE: Multiplanar, multiecho pulse sequences of the brain and surrounding  structures were obtained without intravenous contrast. Angiographic images of the head were obtained using MRA technique without contrast.  COMPARISON:  Head CT 03/23/2014.  Head MRI and MRA 07/11/2013.  FINDINGS: MRI HEAD FINDINGS  There are multiple regions of acute infarction in the right cerebral hemisphere. The largest involvement is cortical and subcortical right parietal and occipital lobes. There is also involvement of the right frontal lobe near the vertex extending inferiorly into the centrum semiovale. Some of these regions of acute infarction are in a similar distribution to those on the prior MRI. There is a punctate focus of susceptibility artifact in the region of the right frontal operculum suggestive of a microhemorrhage. There is no other evidence of intracranial hemorrhage. Coarse calcification is noted along the left aspect of the tentorium, unchanged.  Multiple foci of T2 hyperintensity are again seen within the subcortical and deep cerebral white matter bilaterally, compatible with moderate chronic small vessel ischemic disease.  Encephalomalacia is seen in the right frontal lobe near the vertex. Chronic ischemic changes are again seen in the pons, unchanged. Remote lacunar infarcts are again noted in the basal ganglia and thalamus.  Major intracranial vascular flow voids are preserved. Prior right cataract surgery is noted. Paranasal sinuses and mastoid air cells are clear.  MRA HEAD FINDINGS  The visualized distal vertebral arteries are patent with the left being dominant. There is mild focal narrowing of the proximal right V4 segment. The basilar artery is patent without high-grade stenosis but with mild, diffuse irregularity. Moderate irregularity is again seen involving the PCAs bilaterally. There is a patent left posterior communicating artery.  Internal carotid arteries are patent from skullbase to carotid termini. The bilateral carotid siphon a regular narrowing is again seen, greater on  the right with moderate narrowing of the supraclinoid right ICA, similar to prior. ACA and MCA origins are patent. Moderate, diffuse ACA and MCA irregularity is again seen and is similar to prior.  IMPRESSION: 1. Acute right cerebral hemispheric infarcts as above. Findings could reflect watershed ischemia or possibly emboli. 2. Moderate chronic small vessel ischemic disease and remote infarcts as above. 3. No evidence of major intracranial arterial occlusion. Overall similar appearance of widespread, severe intracranial atherosclerotic disease.   Electronically Signed   By: Deborah Whitehead   On: 03/23/2014 19:17   Mr Brain Wo Contrast  03/23/2014   CLINICAL DATA:  History of hypertension and prior strokes presenting with acute onset of slurred speech and left upper extremity weakness. Possible stroke.  EXAM: MRI HEAD WITHOUT CONTRAST  MRA HEAD WITHOUT CONTRAST  TECHNIQUE: Multiplanar, multiecho pulse sequences of the brain and surrounding structures were obtained without intravenous contrast. Angiographic images of the head were obtained using MRA technique without contrast.  COMPARISON:  Head CT 03/23/2014.  Head MRI and MRA 07/11/2013.  FINDINGS: MRI HEAD FINDINGS  There are multiple regions of acute infarction in the right cerebral hemisphere. The largest involvement is cortical and subcortical right parietal and occipital lobes. There is also involvement of the right frontal lobe near the vertex extending inferiorly into the centrum semiovale. Some of these regions of acute infarction are in a similar distribution to those on the prior MRI. There is a punctate focus of susceptibility artifact in the region of the right frontal operculum suggestive of a microhemorrhage. There is no other evidence of intracranial hemorrhage. Coarse calcification is noted along the left aspect of the tentorium, unchanged.  Multiple foci of T2 hyperintensity are again seen within the subcortical and deep cerebral white matter  bilaterally, compatible with moderate chronic small vessel ischemic disease. Encephalomalacia is seen in the right frontal lobe near the vertex. Chronic ischemic changes are again seen in the pons, unchanged. Remote lacunar infarcts are again noted in the basal ganglia and thalamus.  Major intracranial vascular flow voids are preserved. Prior right cataract surgery is noted. Paranasal sinuses and mastoid air cells are clear.  MRA HEAD FINDINGS  The visualized distal vertebral arteries are patent with the left being dominant. There is mild focal narrowing of the proximal right V4 segment. The basilar artery is patent without high-grade stenosis but with mild, diffuse irregularity. Moderate irregularity is again seen involving the PCAs bilaterally. There is a patent left posterior communicating artery.  Internal carotid arteries are patent from skullbase to carotid termini. The bilateral carotid siphon a regular narrowing is again seen, greater on the right with moderate narrowing of the supraclinoid right ICA, similar to prior. [place]  and MCA origins are patent. Moderate, diffuse ACA and MCA irregularity is again seen and is similar to prior.  IMPRESSION: 1. Acute right cerebral hemispheric infarcts as above. Findings could reflect watershed ischemia or possibly emboli. 2. Moderate chronic small vessel ischemic disease and remote infarcts as above. 3. No evidence of major intracranial arterial occlusion. Overall similar appearance of widespread, severe intracranial atherosclerotic disease.   Electronically Signed   By: Deborah Whitehead   On: 03/23/2014 19:17    CBC  Recent Labs Lab 03/23/14 0230 03/23/14 1147 03/23/14 1223  WBC  --  5.6  --   HGB 14.3 12.5 14.3  HCT 42.0 38.0 42.0  PLT  --  250  --   MCV  --  92.2  --   MCH  --  30.3  --   MCHC  --  32.9  --   RDW  --  13.3  --   LYMPHSABS  --  1.6  --   MONOABS  --  0.4  --   EOSABS  --  0.1  --   BASOSABS  --  0.0  --     Chemistries   Recent  Labs Lab 03/23/14 0230 03/23/14 1147 03/23/14 1223  NA 143 146 142  K 3.8 3.9 3.7  CL 99 103 99  CO2  --  28  --   GLUCOSE 103* 89 92  BUN 9 9 8   CREATININE 1.10 0.97 1.10  CALCIUM  --  9.8  --   AST  --  31  --   ALT  --  18  --   ALKPHOS  --  111  --   BILITOT  --  0.6  --    ------------------------------------------------------------------------------------------------------------------ estimated creatinine clearance is 60.2 ml/min (by C-G formula based on Cr of 1.1). ------------------------------------------------------------------------------------------------------------------  Recent Labs  03/24/14 0510  HGBA1C 6.3*   ------------------------------------------------------------------------------------------------------------------  Recent Labs  03/24/14 0510  CHOL 179  HDL 38*  LDLCALC 125*  TRIG 79  CHOLHDL 4.7   ------------------------------------------------------------------------------------------------------------------ No results found for this basename: TSH, T4TOTAL, FREET3, T3FREE, THYROIDAB,  in the last 72 hours ------------------------------------------------------------------------------------------------------------------ No results found for this basename: VITAMINB12, FOLATE, FERRITIN, TIBC, IRON, RETICCTPCT,  in the last 72 hours  Coagulation profile  Recent Labs Lab 03/23/14 1147  INR 1.06    No results found for this basename: DDIMER,  in the last 72 hours  Cardiac Enzymes No results found for this basename: CK, CKMB, TROPONINI, MYOGLOBIN,  in the last 168 hours ------------------------------------------------------------------------------------------------------------------ No components found with this basename: POCBNP,     Jonique Kulig D.O. on 03/25/2014 at 8:12 AM  Between 7am to 7pm - Pager - [phone]  After 7pm go to [email or web address] - password [email or web address]  And look for the night coverage person covering for me after  hours  [facility] Office  [phone]

## 2014-03-25 NOTE — H&P (View-Only) (Signed)
[facility]                                                                              Patient Demographics  Deborah Whitehead, is a 63 y.o. female, DOB - 1951-09-29, WRU:[id number]  Admit date - 03/23/2014   Admitting Physician Louellen Molder, MD  Outpatient Primary MD for the patient is Kristine Garbe, MD  LOS - 2   Chief Complaint  Patient presents with  . Aphasia        Assessment & Plan  Acute CVA -MRI of the brain: acute right cerebral hemispheric infarct, possibly embolic -Neurology consulted and following -Echo: 55-60%, no source of embolus, grade 1 diastolic dysfunction -Carotid doppler: No evidence of hemodynamically significant internal carotid artery stenosis. Vertebral artery flow is antegrade -Hemoglobin A1c 6.3 -Lipid panel: 179/79/38/125, on atorvastatin -PT/OT/Speech consulted for evaluation and treatment -Continue atorvastatin and Plavix for secondary stroke prevention -patient scheduled for TEE today  Hypertension -Continue metoprolol -HCTZ currently held -Will allow permissive hypertension  Obesity -Will need outpatient counseling for weight reduction exercise program  Anxiety -Continue Xanax  Code Status: Full  Family Communication: None at bedside  Disposition Plan: Admitted, TEE scheduled for today  Time Spent in minutes   25 minutes  Procedures  2D echocardiogram (TTE) Study Conclusions  - Left ventricle: The cavity size was normal. There was mild concentric hypertrophy. Systolic function was normal. The estimated ejection fraction was in the range of 55% to 60%. Wall motion was normal; there were no regional wall motion abnormalities. There was an increased relative contribution of atrial contraction to ventricular filling. Doppler parameters are consistent with abnormal left ventricular relaxation (grade 1 diastolic dysfunction). - Aortic valve: Moderate focal thickening and calcification, consistent with sclerosis. - Mitral  valve: Mild regurgitation.  Carotid Doppler Summary:  The vertebral arteries appear patent with antegrade flow. Findings consistent with 1-39 percent stenosis involving the right internal carotid artery and the left internal carotid artery.  Consults  neurology  DVT Prophylaxis  Lovenox   Lab Results  Component Value Date   PLT 250 03/23/2014    Medications  Scheduled Meds: . atorvastatin  40 mg Oral q1800  . clopidogrel  75 mg Oral Q breakfast  . enoxaparin (LOVENOX) injection  40 mg Subcutaneous Q24H  . meloxicam  15 mg Oral Daily  . metoprolol  50 mg Oral Daily   Continuous Infusions: . sodium chloride 75 mL/hr at 03/23/14 2223   PRN Meds:.ALPRAZolam, ibuprofen, senna-docusate  Antibiotics    Anti-infectives   None      Subjective:   [name] seen and examined today.  Patient states her speech has improved.  She is very tearful this morning and worried about her TEE.    Objective:   Filed Vitals:   03/24/14 1432 03/24/14 1848 03/24/14 2118 03/25/14 0528  BP: 137/88 147/74 138/74 151/73  Pulse: 67 71 82 70  Temp: 98.4 F (36.9 C) 98.4 F (36.9 C) 98.6 F (37 C) 98.4 F (36.9 C)  TempSrc: Oral Oral Oral Oral  Resp: 18 18  18   Height:      Weight:      SpO2: 99% 100% 99% 90%  Wt Readings from Last 3 Encounters:  03/23/14 94.167 kg (207 lb 9.6 oz)  03/23/14 96.163 kg (212 lb)  01/19/14 97.977 kg (216 lb)     Intake/Output Summary (Last 24 hours) at 03/25/14 1025 Last data filed at 03/24/14 1800  Gross per 24 hour  Intake    360 ml  Output    100 ml  Net    260 ml    Exam  General: Well developed, well nourished, NAD, appears stated age  HEENT: NCAT, PERRLA, EOMI, Anicteic Sclera, mucous membranes moist.   Neck: Supple, no JVD, no masses  Cardiovascular: S1 S2 auscultated, no rubs, murmurs or gallops. Regular rate and rhythm.  Respiratory: Clear to auscultation bilaterally with equal chest rise  Abdomen: Soft, nontender,  nondistended, + bowel sounds  Extremities: warm dry without cyanosis clubbing or edema  Neuro: AAOx3, Normal speech, some facial asymmetry. Left hand grip weak as compared to right  Skin: Without rashes exudates or nodules  Psych: Anxious  Data Review   Micro Results No results found for this or any previous visit (from the past 240 hour(s)).  Radiology Reports Ct Head Wo Contrast  03/23/2014   CLINICAL DATA:  APHASIA  EXAM: CT HEAD WITHOUT CONTRAST  TECHNIQUE: Contiguous axial images were obtained from the base of the skull through the vertex without intravenous contrast.  COMPARISON:  CT HEAD WO/W CM dated 01/07/2014  FINDINGS: No acute intracranial abnormality. Specifically, no hemorrhage, hydrocephalus, mass lesion, acute infarction, or significant intracranial injury. No acute calvarial abnormality. Chronic areas of lacunar infarction within the vertex of the right frontal lobe and right occipital lobe. An area of subacute to chronic infarction within the posterior periphery of the right parietal occipital region image 18 series 2. This finding occurred in the interim compared to prior. Diffuse areas of low attenuation within the subcortical, deep, and periventricular white matter regions consistent small vessel white matter ischaemia. Ventricles and cisterns are patent. Visualized paranasal sinuses and mastoid air cells are patent. There is mild diffuse cortical atrophy.  IMPRESSION: Subacute and chronic changes without evidence of acute abnormalities.  Involutional changes also appreciated.   Electronically Signed   By: Margaree Mackintosh M.D.   On: 03/23/2014 13:18   Mr Jodene Nam Head Wo Contrast  03/23/2014   CLINICAL DATA:  History of hypertension and prior strokes presenting with acute onset of slurred speech and left upper extremity weakness. Possible stroke.  EXAM: MRI HEAD WITHOUT CONTRAST  MRA HEAD WITHOUT CONTRAST  TECHNIQUE: Multiplanar, multiecho pulse sequences of the brain and surrounding  structures were obtained without intravenous contrast. Angiographic images of the head were obtained using MRA technique without contrast.  COMPARISON:  Head CT 03/23/2014.  Head MRI and MRA 07/11/2013.  FINDINGS: MRI HEAD FINDINGS  There are multiple regions of acute infarction in the right cerebral hemisphere. The largest involvement is cortical and subcortical right parietal and occipital lobes. There is also involvement of the right frontal lobe near the vertex extending inferiorly into the centrum semiovale. Some of these regions of acute infarction are in a similar distribution to those on the prior MRI. There is a punctate focus of susceptibility artifact in the region of the right frontal operculum suggestive of a microhemorrhage. There is no other evidence of intracranial hemorrhage. Coarse calcification is noted along the left aspect of the tentorium, unchanged.  Multiple foci of T2 hyperintensity are again seen within the subcortical and deep cerebral white matter bilaterally, compatible with moderate chronic small vessel ischemic disease.  Encephalomalacia is seen in the right frontal lobe near the vertex. Chronic ischemic changes are again seen in the pons, unchanged. Remote lacunar infarcts are again noted in the basal ganglia and thalamus.  Major intracranial vascular flow voids are preserved. Prior right cataract surgery is noted. Paranasal sinuses and mastoid air cells are clear.  MRA HEAD FINDINGS  The visualized distal vertebral arteries are patent with the left being dominant. There is mild focal narrowing of the proximal right V4 segment. The basilar artery is patent without high-grade stenosis but with mild, diffuse irregularity. Moderate irregularity is again seen involving the PCAs bilaterally. There is a patent left posterior communicating artery.  Internal carotid arteries are patent from skullbase to carotid termini. The bilateral carotid siphon a regular narrowing is again seen, greater on  the right with moderate narrowing of the supraclinoid right ICA, similar to prior. ACA and MCA origins are patent. Moderate, diffuse ACA and MCA irregularity is again seen and is similar to prior.  IMPRESSION: 1. Acute right cerebral hemispheric infarcts as above. Findings could reflect watershed ischemia or possibly emboli. 2. Moderate chronic small vessel ischemic disease and remote infarcts as above. 3. No evidence of major intracranial arterial occlusion. Overall similar appearance of widespread, severe intracranial atherosclerotic disease.   Electronically Signed   By: Logan Bores   On: 03/23/2014 19:17   Mr Brain Wo Contrast  03/23/2014   CLINICAL DATA:  History of hypertension and prior strokes presenting with acute onset of slurred speech and left upper extremity weakness. Possible stroke.  EXAM: MRI HEAD WITHOUT CONTRAST  MRA HEAD WITHOUT CONTRAST  TECHNIQUE: Multiplanar, multiecho pulse sequences of the brain and surrounding structures were obtained without intravenous contrast. Angiographic images of the head were obtained using MRA technique without contrast.  COMPARISON:  Head CT 03/23/2014.  Head MRI and MRA 07/11/2013.  FINDINGS: MRI HEAD FINDINGS  There are multiple regions of acute infarction in the right cerebral hemisphere. The largest involvement is cortical and subcortical right parietal and occipital lobes. There is also involvement of the right frontal lobe near the vertex extending inferiorly into the centrum semiovale. Some of these regions of acute infarction are in a similar distribution to those on the prior MRI. There is a punctate focus of susceptibility artifact in the region of the right frontal operculum suggestive of a microhemorrhage. There is no other evidence of intracranial hemorrhage. Coarse calcification is noted along the left aspect of the tentorium, unchanged.  Multiple foci of T2 hyperintensity are again seen within the subcortical and deep cerebral white matter  bilaterally, compatible with moderate chronic small vessel ischemic disease. Encephalomalacia is seen in the right frontal lobe near the vertex. Chronic ischemic changes are again seen in the pons, unchanged. Remote lacunar infarcts are again noted in the basal ganglia and thalamus.  Major intracranial vascular flow voids are preserved. Prior right cataract surgery is noted. Paranasal sinuses and mastoid air cells are clear.  MRA HEAD FINDINGS  The visualized distal vertebral arteries are patent with the left being dominant. There is mild focal narrowing of the proximal right V4 segment. The basilar artery is patent without high-grade stenosis but with mild, diffuse irregularity. Moderate irregularity is again seen involving the PCAs bilaterally. There is a patent left posterior communicating artery.  Internal carotid arteries are patent from skullbase to carotid termini. The bilateral carotid siphon a regular narrowing is again seen, greater on the right with moderate narrowing of the supraclinoid right ICA, similar to prior. [place]  and MCA origins are patent. Moderate, diffuse ACA and MCA irregularity is again seen and is similar to prior.  IMPRESSION: 1. Acute right cerebral hemispheric infarcts as above. Findings could reflect watershed ischemia or possibly emboli. 2. Moderate chronic small vessel ischemic disease and remote infarcts as above. 3. No evidence of major intracranial arterial occlusion. Overall similar appearance of widespread, severe intracranial atherosclerotic disease.   Electronically Signed   By: Logan Bores   On: 03/23/2014 19:17    CBC  Recent Labs Lab 03/23/14 0230 03/23/14 1147 03/23/14 1223  WBC  --  5.6  --   HGB 14.3 12.5 14.3  HCT 42.0 38.0 42.0  PLT  --  250  --   MCV  --  92.2  --   MCH  --  30.3  --   MCHC  --  32.9  --   RDW  --  13.3  --   LYMPHSABS  --  1.6  --   MONOABS  --  0.4  --   EOSABS  --  0.1  --   BASOSABS  --  0.0  --     Chemistries   Recent  Labs Lab 03/23/14 0230 03/23/14 1147 03/23/14 1223  NA 143 146 142  K 3.8 3.9 3.7  CL 99 103 99  CO2  --  28  --   GLUCOSE 103* 89 92  BUN 9 9 8   CREATININE 1.10 0.97 1.10  CALCIUM  --  9.8  --   AST  --  31  --   ALT  --  18  --   ALKPHOS  --  111  --   BILITOT  --  0.6  --    ------------------------------------------------------------------------------------------------------------------ estimated creatinine clearance is 60.2 ml/min (by C-G formula based on Cr of 1.1). ------------------------------------------------------------------------------------------------------------------  Recent Labs  03/24/14 0510  HGBA1C 6.3*   ------------------------------------------------------------------------------------------------------------------  Recent Labs  03/24/14 0510  CHOL 179  HDL 38*  LDLCALC 125*  TRIG 79  CHOLHDL 4.7   ------------------------------------------------------------------------------------------------------------------ No results found for this basename: TSH, T4TOTAL, FREET3, T3FREE, THYROIDAB,  in the last 72 hours ------------------------------------------------------------------------------------------------------------------ No results found for this basename: VITAMINB12, FOLATE, FERRITIN, TIBC, IRON, RETICCTPCT,  in the last 72 hours  Coagulation profile  Recent Labs Lab 03/23/14 1147  INR 1.06    No results found for this basename: DDIMER,  in the last 72 hours  Cardiac Enzymes No results found for this basename: CK, CKMB, TROPONINI, MYOGLOBIN,  in the last 168 hours ------------------------------------------------------------------------------------------------------------------ No components found with this basename: POCBNP,     Sahasra Belue D.O. on 03/25/2014 at 8:12 AM  Between 7am to 7pm - Pager - [phone]  After 7pm go to [email or web address] - password [email or web address]  And look for the night coverage person covering for me after  hours  [facility] Office  [phone]

## 2014-03-25 NOTE — Consult Note (Signed)
ELECTROPHYSIOLOGY CONSULT NOTE   Patient ID: Deborah Whitehead MRN: [id number], DOB/AGE: Apr 28, 1951   Admit date: 03/23/2014 Date of Consult: 03/25/2014  Primary Physician: Lin Landsman, MD Primary Cardiologist: None Reason for Consultation: Cryptogenic stroke; recommendations regarding Implantable Loop Recorder  History of Present Illness Deborah Whitehead is a 63 year old woman with prior CVA, HTN, dyslipidemia and prior polysubstance abuse who was admitted on 03/23/2014 with recurrent CVA. She has been monitored on telemetry which has demonstrated no arrhythmias. No cause has been identified. Inpatient stroke work-up is to be completed with a TEE. EP has been asked to evaluate for placement of an implantable loop recorder to monitor for atrial fibrillation.  Past Medical History Past Medical History  Diagnosis Date  . Anxiety   . Arthritis   . Substance abuse   . Hypertension     dr Ayesha Rumpf     immanuel fp  . Shortness of breath   . GERD (gastroesophageal reflux disease)   . Depression   . Fibroids     abd  . Stroke     memory loss    Past Surgical History Past Surgical History  Procedure Laterality Date  . Cesarean section    . Cholecystectomy    . Abdominal hysterectomy    . Ventral hernia repair N/A 06/25/2013    Procedure: LAPAROSCOPIC VENTRAL HERNIA;  Surgeon: Merrie Roof, MD;  Location: [place];  Service: General;  Laterality: N/A;  . Insertion of mesh N/A 06/25/2013    Procedure: INSERTION OF MESH;  Surgeon: Merrie Roof, MD;  Location: [place] [facility];  Service: General;  Laterality: N/A;    Allergies/Intolerances No Known Allergies  Inpatient Medications . atorvastatin  40 mg Oral q1800  . clopidogrel  75 mg Oral Q breakfast  . enoxaparin (LOVENOX) injection  40 mg Subcutaneous Q24H  . [START ON 03/26/2014] lurasidone  20 mg Oral Q breakfast  . meloxicam  15 mg Oral Daily  . metoprolol  50 mg Oral Daily   . sodium chloride 75 mL/hr at 03/23/14 2223    Social  History History   Social History  . Marital Status: Widowed    Spouse Name: N/A    Number of Children: N/A  . Years of Education: N/A   Occupational History  . Not on file.   Social History Main Topics  . Smoking status: Former Smoker -- 0.25 packs/day for 15 years    Quit date: 06/18/2013  . Smokeless tobacco: Never Used  . Alcohol Use: No     Comment: stopped  . Drug Use: Yes    Special: Cocaine     Comment: stopped x 1 yr  . Sexual Activity: Not on file   Other Topics Concern  . Not on file   Social History Narrative  . No narrative on file     Review of Systems General: No chills, fever, night sweats or weight changes  Cardiovascular:  No chest pain, dyspnea on exertion, edema, orthopnea, palpitations, paroxysmal nocturnal dyspnea Dermatological: No rash, lesions or masses Respiratory: No cough, dyspnea Urologic: No hematuria, dysuria Abdominal: No nausea, vomiting, diarrhea, bright red blood per rectum, melena, or hematemesis Neurologic: No visual changes, weakness, changes in mental status All other systems reviewed and are otherwise negative except as noted above.  Physical Exam Blood pressure 144/76, pulse 68, temperature 98.6 F (37 C), temperature source Oral, resp. rate 18, height 5\' 5"  (1.651 m), weight 207 lb 9.6 oz (94.167 kg), SpO2 90.00%.  General: Well developed, well appearing 63 y.o. female in no acute distress. HEENT: Normocephalic, atraumatic. EOMs intact. Sclera nonicteric. Oropharynx clear.  Neck: Supple. No JVD. Lungs: Respirations regular and unlabored, CTA bilaterally. No wheezes, rales or rhonchi. Heart: RRR. S1, S2 present. No murmurs, rub, S3 or S4. Abdomen: Soft, non-distended.  Extremities: No clubbing, cyanosis or edema. DP/PT/Radials 2+ and equal bilaterally. Psych: Normal affect. Neuro: Alert and oriented X 3. Moves all extremities spontaneously. Musculoskeletal: No kyphosis. Skin: Intact. Warm and dry. No rashes or petechiae in  exposed areas.   Labs Lab Results  Component Value Date   WBC 5.6 03/23/2014   HGB 14.3 03/23/2014   HCT 42.0 03/23/2014   MCV 92.2 03/23/2014   PLT 250 03/23/2014    Recent Labs Lab 03/23/14 1147 03/23/14 1223  NA 146 142  K 3.9 3.7  CL 103 99  CO2 28  --   BUN 9 8  CREATININE 0.97 1.10  CALCIUM 9.8  --   PROT 8.0  --   BILITOT 0.6  --   ALKPHOS 111  --   ALT 18  --   AST 31  --   GLUCOSE 89 92    Recent Labs  03/23/14 1147  INR 1.06    Radiology/Studies Ct Head Wo Contrast  03/23/2014   CLINICAL DATA:  APHASIA  EXAM: CT HEAD WITHOUT CONTRAST  TECHNIQUE: Contiguous axial images were obtained from the base of the skull through the vertex without intravenous contrast.  COMPARISON:  CT HEAD WO/W CM dated 01/07/2014  FINDINGS: No acute intracranial abnormality. Specifically, no hemorrhage, hydrocephalus, mass lesion, acute infarction, or significant intracranial injury. No acute calvarial abnormality. Chronic areas of lacunar infarction within the vertex of the right frontal lobe and right occipital lobe. An area of subacute to chronic infarction within the posterior periphery of the right parietal occipital region image 18 series 2. This finding occurred in the interim compared to prior. Diffuse areas of low attenuation within the subcortical, deep, and periventricular white matter regions consistent small vessel white matter ischaemia. Ventricles and cisterns are patent. Visualized paranasal sinuses and mastoid air cells are patent. There is mild diffuse cortical atrophy.  IMPRESSION: Subacute and chronic changes without evidence of acute abnormalities.  Involutional changes also appreciated.   Electronically Signed   By: Margaree Mackintosh M.D.   On: 03/23/2014 13:18   Mr Brain Wo Contrast  03/23/2014   CLINICAL DATA:  History of hypertension and prior strokes presenting with acute onset of slurred speech and left upper extremity weakness. Possible stroke.  EXAM: MRI HEAD WITHOUT CONTRAST   MRA HEAD WITHOUT CONTRAST  TECHNIQUE: Multiplanar, multiecho pulse sequences of the brain and surrounding structures were obtained without intravenous contrast. Angiographic images of the head were obtained using MRA technique without contrast.  COMPARISON:  Head CT 03/23/2014.  Head MRI and MRA 07/11/2013.  FINDINGS: MRI HEAD FINDINGS  There are multiple regions of acute infarction in the right cerebral hemisphere. The largest involvement is cortical and subcortical right parietal and occipital lobes. There is also involvement of the right frontal lobe near the vertex extending inferiorly into the centrum semiovale. Some of these regions of acute infarction are in a similar distribution to those on the prior MRI. There is a punctate focus of susceptibility artifact in the region of the right frontal operculum suggestive of a microhemorrhage. There is no other evidence of intracranial hemorrhage. Coarse calcification is noted along the left aspect of the tentorium, unchanged.  Multiple foci  of T2 hyperintensity are again seen within the subcortical and deep cerebral white matter bilaterally, compatible with moderate chronic small vessel ischemic disease. Encephalomalacia is seen in the right frontal lobe near the vertex. Chronic ischemic changes are again seen in the pons, unchanged. Remote lacunar infarcts are again noted in the basal ganglia and thalamus.  Major intracranial vascular flow voids are preserved. Prior right cataract surgery is noted. Paranasal sinuses and mastoid air cells are clear.  MRA HEAD FINDINGS  The visualized distal vertebral arteries are patent with the left being dominant. There is mild focal narrowing of the proximal right V4 segment. The basilar artery is patent without high-grade stenosis but with mild, diffuse irregularity. Moderate irregularity is again seen involving the PCAs bilaterally. There is a patent left posterior communicating artery.  Internal carotid arteries are patent  from skullbase to carotid termini. The bilateral carotid siphon a regular narrowing is again seen, greater on the right with moderate narrowing of the supraclinoid right ICA, similar to prior. ACA and MCA origins are patent. Moderate, diffuse ACA and MCA irregularity is again seen and is similar to prior.  IMPRESSION: 1. Acute right cerebral hemispheric infarcts as above. Findings could reflect watershed ischemia or possibly emboli. 2. Moderate chronic small vessel ischemic disease and remote infarcts as above. 3. No evidence of major intracranial arterial occlusion. Overall similar appearance of widespread, severe intracranial atherosclerotic disease.   Electronically Signed   By: Logan Bores   On: 03/23/2014 19:17    Echocardiogram  Study Conclusions - Left ventricle: The cavity size was normal. There was mild concentric hypertrophy. Systolic function was normal. The estimated ejection fraction was in the range of 55% to 60%. Wall motion was normal; there were no regional wall motion abnormalities. There was an increased relative contribution of atrial contraction to ventricular filling. Doppler parameters are consistent with abnormal left ventricular relaxation (grade 1 diastolic dysfunction). - Aortic valve: Moderate focal thickening and calcification, consistent with sclerosis. - Mitral valve: Mild regurgitation.  12-lead ECG on admission - NSR at 78 bpm Telemetry reviewed in full - SR; no arrhythmias  Assessment and Plan 1. Cryptogenic stroke  If the TEE is negative, we recommend loop recorder insertion to monitor for AFib. The indication for loop recorder insertion / monitoring for AFib in setting of cryptogenic stroke was discussed with the patient. The loop recorder insertion procedure was reviewed in detail including risks and benefits. These risks include but are not limited to bleeding and infection. The patient expressed verbal understanding and agrees to proceed. The patient was  also counseled regarding wound care and device follow-up.  Signed, Azzie Roup Edmisten, PA-C 03/25/2014, 1:34 PM    As above TEE>>PFO   Venous dopplers Neg  For [facility]

## 2014-03-25 NOTE — Progress Notes (Signed)
Pt was nervous about upcoming procedure so chaplain provided pastoral counseling and emotional support to help pt deal with her anxiety.  Pt was responsive to the support and also wanted prayer.  Chaplain prayed with the pt who seemed to appreciate the visit.

## 2014-03-25 NOTE — Progress Notes (Signed)
  Echocardiogram Echocardiogram Transesophageal has been performed.  [place] 03/25/2014, 4:35 PM

## 2014-03-25 NOTE — ED Provider Notes (Signed)
Medical screening examination/treatment/procedure(s) were conducted as a shared visit with non-physician practitioner(s) and myself.  I personally evaluated the patient during the encounter.   EKG Interpretation   Date/Time:  Tuesday March 23 2014 11:40:26 EDT Ventricular Rate:  73 PR Interval:  172 QRS Duration: 78 QT Interval:  400 QTC Calculation: 441 R Axis:   50 Text Interpretation:  Sinus rhythm Left atrial enlargement Confirmed by  Zenia Resides  MD, Noreen Mackintosh ([id number]) on 03/23/2014 11:47:46 AM       Leota Jacobsen, MD 03/25/14 1601

## 2014-03-25 NOTE — Progress Notes (Cosign Needed)
Clinical Social Work Department BRIEF PSYCHOSOCIAL ASSESSMENT 03/25/2014  Patient:  ARLINE, KETTER     Account Number:  [id number]     Admit date:  03/23/2014  Clinical Social Worker:  Pete Pelt, CLINICAL SOCIAL WORKER  Date/Time:  03/25/2014 10:36 AM  Referred by:  Physician  Date Referred:  03/25/2014 Referred for  SNF Placement   Other Referral:   Interview type:  Patient Other interview type:   BSW Intern met with Pt at the beside to discuss d/c planning.    PSYCHOSOCIAL DATA Living Status:  With Adult daughter Admitted from facility:   Level of care:   Primary support name:  Beatriz Quintela [phone] Primary support relationship to patient:  CHILD, ADULT Degree of support available:   Pt has good support from daughter.    CURRENT CONCERNS Current Concerns  Post-Acute Placement   Other Concerns:    SOCIAL WORK ASSESSMENT / PLAN BSW Intern met with Pt to discuss d/c planning. Pt states that she has done rehab at [facility] but Pt has never been to a SNF. Pt states that she is fine with going to a SNF to do PT/OT so she can get better and home back home. Pt states that it was ok for BSW Intern to send her information to SNF's in the [place] area.    BSW Intern left a SNF  list by the bedside and Pt states that she will go over it with her daughter.    CSW or BSW Intern will follow-up with Pt  regarding d/c planning   Assessment/plan status:  Psychosocial Support/Ongoing Assessment of Needs Other assessment/ plan:   Information/referral to community resources:    PATIENT'S/FAMILY'S RESPONSE TO PLAN OF CARE: Pt was unaware of d/c planning process and was appreciative of services given by [employer]. Pt started to tear up while talking and states that was because she gets anxiety when she gets in the hospital. BSW informed Pt that she is in good hands right now and she will get the best care that [facility] can provide. Pt started to smile and said  thank you.    CSW or BSW Intern will follow-up with Pt.       [place] BSW-Intern [facility] [phone]

## 2014-03-25 NOTE — Progress Notes (Signed)
PT Cancellation Note  Patient Details Name: Deborah Whitehead MRN: [id number] DOB: 07-23-1951   Cancelled Treatment:    Reason Eval/Treat Not Completed: Patient declined, no reason specified.  Pt very tearful and anxious about upcoming TEE.  Listened and provided comfort.  Will f/u tomorrow.     Thornton Papas [name] 03/25/2014, 3:38 PM

## 2014-03-25 NOTE — Progress Notes (Signed)
BSW Intern met with Pt by the bedside to give Pt bed offers that came in. Pt was appreciative and states that she will go over bed offers with her daughter.  CSW or BSW will follow up d/c planning with patient.   [place] BSW-Intern [facility] [phone]

## 2014-03-25 NOTE — Interval H&P Note (Signed)
History and Physical Interval Note:  03/25/2014 3:13 PM  Deborah Whitehead  has presented today for surgery, with the diagnosis of STROKE  The various methods of treatment have been discussed with the patient and family. After consideration of risks, benefits and other options for treatment, the patient has consented to  Procedure(s): TRANSESOPHAGEAL ECHOCARDIOGRAM (TEE) (N/A) as a surgical intervention .  The patient's history has been reviewed, patient examined, no change in status, stable for surgery.  I have reviewed the patient's chart and labs.  Questions were answered to the patient's satisfaction.     Dorothy Spark

## 2014-03-25 NOTE — Progress Notes (Signed)
Clinical Social Work Department CLINICAL SOCIAL WORK PLACEMENT NOTE 03/25/2014  Patient:  Deborah Whitehead, Deborah Whitehead  Account Number:  [id number] Admit date:  03/23/2014  Clinical Social Worker:  Pete Pelt, CLINICAL SOCIAL WORKER  Date/time:  03/25/2014 01:56 PM  Clinical Social Work is seeking post-discharge placement for this patient at the following level of care:   SKILLED NURSING   (*CSW will update this form in Epic as items are completed)   03/25/2014  Patient/family provided with [place] Department of Clinical Social Work's list of facilities offering this level of care within the geographic area requested by the patient (or if unable, by the patient's family).  03/25/2014  Patient/family informed of their freedom to choose among providers that offer the needed level of care, that participate in Medicare, Medicaid or managed care program needed by the patient, have an available bed and are willing to accept the patient.  03/25/2014  Patient/family informed of [facility]' ownership interest in [facility], as well as of the fact that they are under no obligation to receive care at this facility.  PASARR submitted to EDS on 03/25/2014 PASARR number received from EDS on 03/25/2014  FL2 transmitted to all facilities in geographic area requested by pt/family on  03/25/2014 FL2 transmitted to all facilities within larger geographic area on 03/25/2014  Patient informed that his/her managed care company has contracts with or will negotiate with  certain facilities, including the following:     Patient/family informed of bed offers received:   Patient chooses bed at  Physician recommends and patient chooses bed at    Patient to be transferred to  on   Patient to be transferred to facility by   The following physician request were entered in Epic:   Additional Comments:    [facility] 1-16;  6N1-16 Phone: [phone]

## 2014-03-25 NOTE — Progress Notes (Signed)
Stroke Team Progress Note  HISTORY Deborah Whitehead is a 63 y.o. female with a history of hypertension who presented to the emergency room 03/23/2014 at 0137 with transient left hand dysfunction. She had abnormal behavior in the emergency room, but her daughter stated that this was normal because the patient's her from extreme anxiety when near doctors. She was diagnosed with TIA versus anxiety and discharged from the emergency room.  She again had more difficulty with left side weakness this morning 03/23/2014 and has returned at 1138. She had an MRI which shows right posterior stroke. She is unfortunately unable to give me much in the way of history due to having been sedated with Ativan for her MRI.  She was last known well 03/22/2014 in the afternoon. Patient was not administered TPA secondary to unknown time of onset, delay in arrival. She was admitted for further evaluation and treatment.  SUBJECTIVE Patient in bed. Anxious about test scheduled today. She would like it canceled, but understands she may need it. Is tearful. Reassured by Dr. Leonie Whitehead and quickly calmed.  OBJECTIVE Most recent Vital Signs: Filed Vitals:   03/24/14 1432 03/24/14 1848 03/24/14 2118 03/25/14 0528  BP: 137/88 147/74 138/74 151/73  Pulse: 67 71 82 70  Temp: 98.4 F (36.9 C) 98.4 F (36.9 C) 98.6 F (37 C) 98.4 F (36.9 C)  TempSrc: Oral Oral Oral Oral  Resp: 18 18  18   Height:      Weight:      SpO2: 99% 100% 99% 90%   CBG (last 3)  No results found for this basename: GLUCAP,  in the last 72 hours  IV Fluid Intake:   . sodium chloride 75 mL/hr at 03/23/14 2223    MEDICATIONS  . atorvastatin  40 mg Oral q1800  . clopidogrel  75 mg Oral Q breakfast  . enoxaparin (LOVENOX) injection  40 mg Subcutaneous Q24H  . meloxicam  15 mg Oral Daily  . metoprolol  50 mg Oral Daily   PRN:  ALPRAZolam, ibuprofen, senna-docusate  Diet:  NPO  Activity:  OOB  with assistance DVT Prophylaxis:  Lovenox 40 mg sq daily    CLINICALLY SIGNIFICANT STUDIES Basic Metabolic Panel:   Recent Labs Lab 03/23/14 1147 03/23/14 1223  NA 146 142  K 3.9 3.7  CL 103 99  CO2 28  --   GLUCOSE 89 92  BUN 9 8  CREATININE 0.97 1.10  CALCIUM 9.8  --    Liver Function Tests:   Recent Labs Lab 03/23/14 1147  AST 31  ALT 18  ALKPHOS 111  BILITOT 0.6  PROT 8.0  ALBUMIN 3.7   CBC:   Recent Labs Lab 03/23/14 1147 03/23/14 1223  WBC 5.6  --   NEUTROABS 3.5  --   HGB 12.5 14.3  HCT 38.0 42.0  MCV 92.2  --   PLT 250  --    Coagulation:   Recent Labs Lab 03/23/14 1147  LABPROT 13.6  INR 1.06   Cardiac Enzymes: No results found for this basename: CKTOTAL, CKMB, CKMBINDEX, TROPONINI,  in the last 168 hours Urinalysis:   Recent Labs Lab 03/23/14 1323  COLORURINE YELLOW  LABSPEC 1.013  PHURINE 6.5  GLUCOSEU NEGATIVE  HGBUR MODERATE*  BILIRUBINUR NEGATIVE  KETONESUR 15*  PROTEINUR NEGATIVE  UROBILINOGEN 1.0  NITRITE NEGATIVE  LEUKOCYTESUR MODERATE*   Lipid Panel    Component Value Date/Time   CHOL 179 03/24/2014 0510   TRIG 79 03/24/2014 0510   HDL 38* 03/24/2014  0510   HDL 43 01/21/2014 1023   CHOLHDL 4.7 03/24/2014 0510   CHOLHDL 4.5* 01/21/2014 1023   VLDL 16 03/24/2014 0510   LDLCALC 125* 03/24/2014 0510   LDLCALC 138* 01/21/2014 1023   HgbA1C  Lab Results  Component Value Date   HGBA1C 6.3* 03/24/2014    Urine Drug Screen:     Component Value Date/Time   LABOPIA NONE DETECTED 03/23/2014 1323   COCAINSCRNUR NONE DETECTED 03/23/2014 1323   LABBENZ NONE DETECTED 03/23/2014 1323   AMPHETMU NONE DETECTED 03/23/2014 1323   THCU NONE DETECTED 03/23/2014 1323   LABBARB NONE DETECTED 03/23/2014 1323    Alcohol Level:   Recent Labs Lab 03/23/14 1147  ETH <11    CT of the brain  03/23/2014    Subacute and chronic changes without evidence of acute abnormalities.  Involutional changes also appreciated.     MRI of the brain  03/23/2014   1. Acute right cerebral hemispheric infarcts as above. Findings could  reflect watershed ischemia or possibly emboli. 2. Moderate chronic small vessel ischemic disease and remote infarcts  MRA of the brain  03/23/2014    No evidence of major intracranial arterial occlusion. Overall similar appearance of widespread, severe intracranial atherosclerotic disease.   2D Echocardiogram  EF 55-60% with no source of embolus. Moderate focal thickening AV, mild MR  Carotid Doppler  No evidence of hemodynamically significant internal carotid artery stenosis. Vertebral artery flow is antegrade.   EKG  normal sinus rhythm. For complete results please see formal report.   Therapy Recommendations SNF  Physical Exam   Middle aged obese lady not in distress.Awake alert. Afebrile. Head is nontraumatic. Neck is supple without bruit. Hearing is normal. Cardiac exam no murmur or gallop. Lungs are clear to auscultation. Distal pulses are well felt. Neurological Exam : Awake alert oriented x 3 normal speech and language. Mild left lower face asymmetry. Tongue midline. No drift. Mild diminished fine finger movements on left. Orbits right over left upper extremity. Mild left grip weak.. Normal sensation . Normal coordination.  ASSESSMENT [name] is a 63 y.o. female presenting with transient left hand dysfunction. Imaging confirms a right parietal and occipital, cortical and subcortical infarcts. 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  For TEE today. On clopidogrel 75 mg orally every day prior to admission. Now on clopidogrel 75 mg orally every day for secondary stroke prevention. Stroke work up underway.  hypertension  Hyperlipidemia, LDL 125, on no statin PTA, now on lipitor 40 mg daily, goal LDL < 100 (< 70 for diabetics) Hyperglycemia, HgbA1c 7.1, likely new dx diabetes Obesity, Body mass index is 34.55 kg/(m^2).  Anxiety, on xanax at night. Hx depression, on Latuda in the past, followed by Dr. Gregary Whitehead day # 2  TREATMENT/PLAN  Continue  clopidogrel 75 mg orally every day for secondary stroke prevention. TEE to look for embolic source. Arranged with [place] for today.  If positive for PFO (patent foramen ovale), check bilateral lower extremity venous dopplers to rule out DVT as possible source of stroke. If TEE negative, a [place] electrophysiologist will consult and consider placement of an place implantable loop recorder to evaluate for atrial fibrillation as etiology of stroke. This has been explained to patient/family by Dr. Leonie Whitehead and they are agreeable.  Resume latuda 20 mg daily as prior to admission 1 dose IV atival now for anxiety (pt NPO for TEE)  SNF recommended at discharge by  therapists  Burnetta Sabin, MSN, RN, ANVP-BC, AGPCNP-BC [facility] Pager: [phone] 03/25/2014 10:05 AM  I have personally obtained a history, examined the patient, evaluated imaging results, and formulated the assessment and plan of care. I agree with the above.  Antony Contras, MD   To contact Stroke Continuity provider, please refer to [email or web address] After hours, contact General Neurology

## 2014-03-25 NOTE — CV Procedure (Signed)
     Transesophageal Echocardiogram Note  Deborah Whitehead [id number] [birth year]  Procedure: Transesophageal Echocardiogram Indications: Stroke  Procedure Details Consent: Obtained Time Out: Verified patient identification, verified procedure, site/side was marked, verified correct patient position, special equipment/implants available, Radiology Safety Procedures followed,  medications/allergies/relevent history reviewed, required imaging and test results available.  Performed  Medications: Fentanyl: 100 mcg Versed: 9 mg  Left Ventrical:   he cavity size was normal. There was mild concentric hypertrophy. Systolic function was normal. The estimated ejection fraction was in the range of 55% to 60%. Wall motion was normal  Mitral Valve: Mild MR.  Aortic Valve: Moderate focal thickening and calcification, consistent with sclerosis.  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  Dorothy Spark, MD, [facility] 03/25/2014, 3:13 PM

## 2014-03-26 ENCOUNTER — Encounter (HOSPITAL_COMMUNITY): Payer: Self-pay | Admitting: Cardiology

## 2014-03-26 ENCOUNTER — Encounter (HOSPITAL_COMMUNITY)
Admission: EM | Disposition: A | Discharge status: Skilled Nursing Facility | Payer: Self-pay | Source: Home / Self Care | Attending: Internal Medicine

## 2014-03-26 DIAGNOSIS — Q211 Atrial septal defect: Secondary | ICD-10-CM

## 2014-03-26 DIAGNOSIS — Q2111 Secundum atrial septal defect: Secondary | ICD-10-CM

## 2014-03-26 DIAGNOSIS — I6350 Cerebral infarction due to unspecified occlusion or stenosis of unspecified cerebral artery: Secondary | ICD-10-CM

## 2014-03-26 HISTORY — PX: LOOP RECORDER IMPLANT: SHX5954

## 2014-03-26 HISTORY — PX: LOOP RECORDER IMPLANT: SHX5477

## 2014-03-26 SURGERY — LOOP RECORDER IMPLANT
Anesthesia: LOCAL

## 2014-03-26 MED ORDER — LIDOCAINE-EPINEPHRINE 1 %-1:100000 IJ SOLN
INTRAMUSCULAR | Status: AC
  Filled 2014-03-26: qty 1

## 2014-03-26 NOTE — Progress Notes (Signed)
[facility]                                                                              Patient Demographics  Deborah Whitehead, is a 63 y.o. female, DOB - 1951/05/12, HUD:[id number]  Admit date - 03/23/2014   Admitting Physician Deborah Molder, MD  Outpatient Primary MD for the patient is Deborah Garbe, MD  LOS - 3   Chief Complaint  Patient presents with  . Aphasia        Assessment & Plan  Acute CVA -MRI of the brain: acute right cerebral hemispheric infarct, possibly embolic -Neurology consulted and following -Echo: 55-60%, no source of embolus, grade 1 diastolic dysfunction -Carotid doppler: No evidence of hemodynamically significant internal carotid artery stenosis. Vertebral artery flow is antegrade -TEE: no thrombus or vegetation, EF 55%, PFO -Hemoglobin A1c 6.3 -Lipid panel: 179/79/38/125, on atorvastatin -PT/OT/Speech consulted for evaluation and treatment -Continue atorvastatin and Plavix for secondary stroke prevention -Pending LE doppler to evaluate for possible DVT -Possible loop recorder?  Hypertension -Continue metoprolol -HCTZ currently held  Obesity -Will need outpatient counseling for weight reduction exercise program  Anxiety -Continue Xanax and latuda  Code Status: Full  Family Communication: None at bedside  Disposition Plan: Admitted  Time Spent in minutes   25 minutes  Procedures  2D echocardiogram (TTE) Study Conclusions  - Left ventricle: The cavity size was normal. There was mild concentric hypertrophy. Systolic function was normal. The estimated ejection fraction was in the range of 55% to 60%. Wall motion was normal; there were no regional wall motion abnormalities. There was an increased relative contribution of atrial contraction to ventricular filling. Doppler parameters are consistent with abnormal left ventricular relaxation (grade 1 diastolic dysfunction). - Aortic valve: Moderate focal thickening and  calcification, consistent with sclerosis. - Mitral valve: Mild regurgitation.  Carotid Doppler Summary:  The vertebral arteries appear patent with antegrade flow. Findings consistent with 1-39 percent stenosis involving the right internal carotid artery and the left internal carotid artery.  Transesophageal echocardiogram (TEE) Positive PFO by an agitated saline (bubble study), No thrombus in LA/LAA, EF 55-60%  Consults   Neurology Cardiology  DVT Prophylaxis  Lovenox   Lab Results  Component Value Date   PLT 250 03/23/2014    Medications  Scheduled Meds: . atorvastatin  40 mg Oral q1800  . clopidogrel  75 mg Oral Q breakfast  . enoxaparin (LOVENOX) injection  40 mg Subcutaneous Q24H  . lurasidone  20 mg Oral Q breakfast  . meloxicam  15 mg Oral Daily  . metoprolol  50 mg Oral Daily   Continuous Infusions: . sodium chloride 75 mL/hr at 03/23/14 2223  . sodium chloride 500 mL (03/25/14 1538)   PRN Meds:.ALPRAZolam, ibuprofen, senna-docusate  Antibiotics    Anti-infectives   None      Subjective:   Deborah Whitehead seen and examined today.  Patient has no complaints, although very tearful and worried.  Denies dizziness, chest pain, headache, shortness of breath, abdominal pain.  Objective:   Filed Vitals:   03/25/14 1630 03/25/14 2211 03/26/14 0115 03/26/14 0551  BP: 133/78 132/78 137/79 136/81  Pulse: 74 65 68 63  Temp:  98.8  F (37.1 C) 98.4 F (36.9 C) 98.5 F (36.9 C)  TempSrc:  Oral Oral Oral  Resp: 16 20 18 18   Height:      Weight:      SpO2: 97% 98% 96% 99%    Wt Readings from Last 3 Encounters:  03/23/14 94.167 kg (207 lb 9.6 oz)  03/23/14 94.167 kg (207 lb 9.6 oz)  03/23/14 94.167 kg (207 lb 9.6 oz)    No intake or output data in the 24 hours ending 03/26/14 0738  Exam  General: Well developed, well nourished, NAD, appears stated age  HEENT: NCAT, mucous membranes moist.   Neck: Supple, no JVD, no masses  Cardiovascular: S1 S2  auscultated, no rubs, murmurs or gallops. Regular rate and rhythm.  Respiratory: Clear to auscultation bilaterally with equal chest rise  Abdomen: Soft, nontender, nondistended, + bowel sounds  Extremities: warm dry without cyanosis clubbing or edema  Neuro: AAOx3, Normal speech, some facial asymmetry. Left hand grip weak as compared to right  Skin: Without rashes exudates or nodules  Psych: Anxious  Data Review   Micro Results No results found for this or any previous visit (from the past 240 hour(s)).  Radiology Reports Ct Head Wo Contrast  03/23/2014   CLINICAL DATA:  APHASIA  EXAM: CT HEAD WITHOUT CONTRAST  TECHNIQUE: Contiguous axial images were obtained from the base of the skull through the vertex without intravenous contrast.  COMPARISON:  CT HEAD WO/W CM dated 01/07/2014  FINDINGS: No acute intracranial abnormality. Specifically, no hemorrhage, hydrocephalus, mass lesion, acute infarction, or significant intracranial injury. No acute calvarial abnormality. Chronic areas of lacunar infarction within the vertex of the right frontal lobe and right occipital lobe. An area of subacute to chronic infarction within the posterior periphery of the right parietal occipital region image 18 series 2. This finding occurred in the interim compared to prior. Diffuse areas of low attenuation within the subcortical, deep, and periventricular white matter regions consistent small vessel white matter ischaemia. Ventricles and cisterns are patent. Visualized paranasal sinuses and mastoid air cells are patent. There is mild diffuse cortical atrophy.  IMPRESSION: Subacute and chronic changes without evidence of acute abnormalities.  Involutional changes also appreciated.   Electronically Signed   By: Margaree Mackintosh M.D.   On: 03/23/2014 13:18   Mr Deborah Whitehead Head Wo Contrast  03/23/2014   CLINICAL DATA:  History of hypertension and prior strokes presenting with acute onset of slurred speech and left upper extremity  weakness. Possible stroke.  EXAM: MRI HEAD WITHOUT CONTRAST  MRA HEAD WITHOUT CONTRAST  TECHNIQUE: Multiplanar, multiecho pulse sequences of the brain and surrounding structures were obtained without intravenous contrast. Angiographic images of the head were obtained using MRA technique without contrast.  COMPARISON:  Head CT 03/23/2014.  Head MRI and MRA 07/11/2013.  FINDINGS: MRI HEAD FINDINGS  There are multiple regions of acute infarction in the right cerebral hemisphere. The largest involvement is cortical and subcortical right parietal and occipital lobes. There is also involvement of the right frontal lobe near the vertex extending inferiorly into the centrum semiovale. Some of these regions of acute infarction are in a similar distribution to those on the prior MRI. There is a punctate focus of susceptibility artifact in the region of the right frontal operculum suggestive of a microhemorrhage. There is no other evidence of intracranial hemorrhage. Coarse calcification is noted along the left aspect of the tentorium, unchanged.  Multiple foci of T2 hyperintensity are again seen within the subcortical and  deep cerebral white matter bilaterally, compatible with moderate chronic small vessel ischemic disease. Encephalomalacia is seen in the right frontal lobe near the vertex. Chronic ischemic changes are again seen in the pons, unchanged. Remote lacunar infarcts are again noted in the basal ganglia and thalamus.  Major intracranial vascular flow voids are preserved. Prior right cataract surgery is noted. Paranasal sinuses and mastoid air cells are clear.  MRA HEAD FINDINGS  The visualized distal vertebral arteries are patent with the left being dominant. There is mild focal narrowing of the proximal right V4 segment. The basilar artery is patent without high-grade stenosis but with mild, diffuse irregularity. Moderate irregularity is again seen involving the PCAs bilaterally. There is a patent left posterior  communicating artery.  Internal carotid arteries are patent from skullbase to carotid termini. The bilateral carotid siphon a regular narrowing is again seen, greater on the right with moderate narrowing of the supraclinoid right ICA, similar to prior. ACA and MCA origins are patent. Moderate, diffuse ACA and MCA irregularity is again seen and is similar to prior.  IMPRESSION: 1. Acute right cerebral hemispheric infarcts as above. Findings could reflect watershed ischemia or possibly emboli. 2. Moderate chronic small vessel ischemic disease and remote infarcts as above. 3. No evidence of major intracranial arterial occlusion. Overall similar appearance of widespread, severe intracranial atherosclerotic disease.   Electronically Signed   By: Logan Bores   On: 03/23/2014 19:17   Mr Brain Wo Contrast  03/23/2014   CLINICAL DATA:  History of hypertension and prior strokes presenting with acute onset of slurred speech and left upper extremity weakness. Possible stroke.  EXAM: MRI HEAD WITHOUT CONTRAST  MRA HEAD WITHOUT CONTRAST  TECHNIQUE: Multiplanar, multiecho pulse sequences of the brain and surrounding structures were obtained without intravenous contrast. Angiographic images of the head were obtained using MRA technique without contrast.  COMPARISON:  Head CT 03/23/2014.  Head MRI and MRA 07/11/2013.  FINDINGS: MRI HEAD FINDINGS  There are multiple regions of acute infarction in the right cerebral hemisphere. The largest involvement is cortical and subcortical right parietal and occipital lobes. There is also involvement of the right frontal lobe near the vertex extending inferiorly into the centrum semiovale. Some of these regions of acute infarction are in a similar distribution to those on the prior MRI. There is a punctate focus of susceptibility artifact in the region of the right frontal operculum suggestive of a microhemorrhage. There is no other evidence of intracranial hemorrhage. Coarse calcification is  noted along the left aspect of the tentorium, unchanged.  Multiple foci of T2 hyperintensity are again seen within the subcortical and deep cerebral white matter bilaterally, compatible with moderate chronic small vessel ischemic disease. Encephalomalacia is seen in the right frontal lobe near the vertex. Chronic ischemic changes are again seen in the pons, unchanged. Remote lacunar infarcts are again noted in the basal ganglia and thalamus.  Major intracranial vascular flow voids are preserved. Prior right cataract surgery is noted. Paranasal sinuses and mastoid air cells are clear.  MRA HEAD FINDINGS  The visualized distal vertebral arteries are patent with the left being dominant. There is mild focal narrowing of the proximal right V4 segment. The basilar artery is patent without high-grade stenosis but with mild, diffuse irregularity. Moderate irregularity is again seen involving the PCAs bilaterally. There is a patent left posterior communicating artery.  Internal carotid arteries are patent from skullbase to carotid termini. The bilateral carotid siphon a regular narrowing is again seen, greater on the  right with moderate narrowing of the supraclinoid right ICA, similar to prior. ACA and MCA origins are patent. Moderate, diffuse ACA and MCA irregularity is again seen and is similar to prior.  IMPRESSION: 1. Acute right cerebral hemispheric infarcts as above. Findings could reflect watershed ischemia or possibly emboli. 2. Moderate chronic small vessel ischemic disease and remote infarcts as above. 3. No evidence of major intracranial arterial occlusion. Overall similar appearance of widespread, severe intracranial atherosclerotic disease.   Electronically Signed   By: Logan Bores   On: 03/23/2014 19:17    CBC  Recent Labs Lab 03/23/14 0230 03/23/14 1147 03/23/14 1223  WBC  --  5.6  --   HGB 14.3 12.5 14.3  HCT 42.0 38.0 42.0  PLT  --  250  --   MCV  --  92.2  --   MCH  --  30.3  --   MCHC  --   32.9  --   RDW  --  13.3  --   LYMPHSABS  --  1.6  --   MONOABS  --  0.4  --   EOSABS  --  0.1  --   BASOSABS  --  0.0  --     Chemistries   Recent Labs Lab 03/23/14 0230 03/23/14 1147 03/23/14 1223  NA 143 146 142  K 3.8 3.9 3.7  CL 99 103 99  CO2  --  28  --   GLUCOSE 103* 89 92  BUN 9 9 8   CREATININE 1.10 0.97 1.10  CALCIUM  --  9.8  --   AST  --  31  --   ALT  --  18  --   ALKPHOS  --  111  --   BILITOT  --  0.6  --    ------------------------------------------------------------------------------------------------------------------ estimated creatinine clearance is 60.2 ml/min (by C-G formula based on Cr of 1.1). ------------------------------------------------------------------------------------------------------------------  Recent Labs  03/24/14 0510  HGBA1C 6.3*   ------------------------------------------------------------------------------------------------------------------  Recent Labs  03/24/14 0510  CHOL 179  HDL 38*  LDLCALC 125*  TRIG 79  CHOLHDL 4.7   ------------------------------------------------------------------------------------------------------------------ No results found for this basename: TSH, T4TOTAL, FREET3, T3FREE, THYROIDAB,  in the last 72 hours ------------------------------------------------------------------------------------------------------------------ No results found for this basename: VITAMINB12, FOLATE, FERRITIN, TIBC, IRON, RETICCTPCT,  in the last 72 hours  Coagulation profile  Recent Labs Lab 03/23/14 1147  INR 1.06    No results found for this basename: DDIMER,  in the last 72 hours  Cardiac Enzymes No results found for this basename: CK, CKMB, TROPONINI, MYOGLOBIN,  in the last 168 hours ------------------------------------------------------------------------------------------------------------------ No components found with this basename: POCBNP,     Wally Shevchenko D.O. on 03/26/2014 at 7:38  AM  Between 7am to 7pm - Pager - [phone]  After 7pm go to [email or web address] - password [email or web address]  And look for the night coverage person covering for me after hours  [facility] Office  [phone]

## 2014-03-26 NOTE — Progress Notes (Signed)
Physical Therapy Treatment Patient Details Name: Deborah Whitehead MRN: [id number] DOB: 06/08/1951 Today's Date: 03/26/2014    History of Present Illness 63 y.o. s/p Acute right cerebral hemispheric infarcts    PT Comments    Pt able to progress mobility today, though is somewhat labile and needs encouragement.  Will continue to follow.    Follow Up Recommendations  SNF     Equipment Recommendations  None recommended by PT    Recommendations for Other Services       Precautions / Restrictions Precautions Precautions: Fall Restrictions Weight Bearing Restrictions: No    Mobility  Bed Mobility                  Transfers Overall transfer level: Needs assistance Equipment used: Rolling walker (2 wheeled) Transfers: Sit to/from Stand Sit to Stand: Mod assist         General transfer comment: cues for UE use and A with anterior wt shifting when coming to stand.    Ambulation/Gait Ambulation/Gait assistance: Min assist Ambulation Distance (Feet): 60 Feet Assistive device: Rolling walker (2 wheeled) Gait Pattern/deviations: Step-through pattern;Decreased stride length;Decreased step length - right;Decreased stance time - left;Drifts right/left;Trunk flexed     General Gait Details: pt is consistently needing MinA to maintain balance, though was able to increase her ambulation distance today.  pt continues to have weakness in her L LE with L knee giving, but not buckling as bad as last time.  pt again not verbalizing fatigue.     Stairs            Wheelchair Mobility    Modified Rankin (Stroke Patients Only) Modified Rankin (Stroke Patients Only) Pre-Morbid Rankin Score: Moderately severe disability Modified Rankin: Moderately severe disability     Balance Overall balance assessment: Needs assistance         Standing balance support: Bilateral upper extremity supported Standing balance-Leahy Scale: Poor                       Cognition Arousal/Alertness: Awake/alert Behavior During Therapy: Anxious Overall Cognitive Status: No family/caregiver present to determine baseline cognitive functioning                      Exercises      General Comments        Pertinent Vitals/Pain Denied pain.      Home Living                      Prior Function            PT Goals (current goals can now be found in the care plan section) Acute Rehab PT Goals Patient Stated Goal: not stated Time For Goal Achievement: 04/07/14 Potential to Achieve Goals: Good Progress towards PT goals: Progressing toward goals    Frequency  Min 4X/week    PT Plan Current plan remains appropriate    Co-evaluation             End of Session Equipment Utilized During Treatment: Gait belt Activity Tolerance: Patient tolerated treatment well Patient left: in chair;with call bell/phone within reach;with chair alarm set;with family/visitor present     Time: 4742-5956 PT Time Calculation (min): 25 min  Charges:  $Gait Training: 23-37 mins                    G CodesCatarina Hartshorn, [place] [phone] 03/26/2014, 12:03  PM   

## 2014-03-26 NOTE — Progress Notes (Signed)
OT Cancellation Note  Patient Details Name: Deborah Whitehead MRN: [id number] DOB: July 29, 1951   Cancelled Treatment:    Reason Eval/Treat Not Completed: Patient at procedure or test/ unavailable.  [place], OTR/L [phone] 03/26/2014, 3:25 PM

## 2014-03-26 NOTE — Progress Notes (Signed)
Stroke Team Progress Note  HISTORY Deborah Whitehead is a 63 y.o. female with a history of hypertension who presented to the emergency room 03/23/2014 at 0137 with transient left hand dysfunction. She had abnormal behavior in the emergency room, but her daughter stated that this was normal because the patient's her from extreme anxiety when near doctors. She was diagnosed with TIA versus anxiety and discharged from the emergency room.  She again had more difficulty with left side weakness this morning 03/23/2014 and has returned at 1138. She had an MRI which shows right posterior stroke. She is unfortunately unable to give me much in the way of history due to having been sedated with Ativan for her MRI.  She was last known well 03/22/2014 in the afternoon. Patient was not administered TPA secondary to unknown time of onset, delay in arrival. She was admitted for further evaluation and treatment.  SUBJECTIVE Patient in bed. Anxious about test scheduled today.  I discussed TEE results and need for LE venous dopplers  OBJECTIVE Most recent Vital Signs: Filed Vitals:   03/25/14 2211 03/26/14 0115 03/26/14 0551 03/26/14 1047  BP: 132/78 137/79 136/81 141/90  Pulse: 65 68 63 71  Temp:  98.4 F (36.9 C) 98.5 F (36.9 C) 98 F (36.7 C)  TempSrc: Oral Oral Oral Oral  Resp: 20 18 18 20   Height:      Weight:      SpO2: 98% 96% 99% 100%   CBG (last 3)  No results found for this basename: GLUCAP,  in the last 72 hours  IV Fluid Intake:   . sodium chloride 75 mL/hr at 03/23/14 2223    MEDICATIONS  . atorvastatin  40 mg Oral q1800  . clopidogrel  75 mg Oral Q breakfast  . enoxaparin (LOVENOX) injection  40 mg Subcutaneous Q24H  . lurasidone  20 mg Oral Q breakfast  . meloxicam  15 mg Oral Daily  . metoprolol  50 mg Oral Daily   PRN:  ALPRAZolam, ibuprofen, senna-docusate  Diet:  Cardiac  Activity:  OOB  with assistance DVT Prophylaxis:  Lovenox 40 mg sq daily   CLINICALLY SIGNIFICANT  STUDIES Basic Metabolic Panel:   Recent Labs Lab 03/23/14 1147 03/23/14 1223  NA 146 142  K 3.9 3.7  CL 103 99  CO2 28  --   GLUCOSE 89 92  BUN 9 8  CREATININE 0.97 1.10  CALCIUM 9.8  --    Liver Function Tests:   Recent Labs Lab 03/23/14 1147  AST 31  ALT 18  ALKPHOS 111  BILITOT 0.6  PROT 8.0  ALBUMIN 3.7   CBC:   Recent Labs Lab 03/23/14 1147 03/23/14 1223  WBC 5.6  --   NEUTROABS 3.5  --   HGB 12.5 14.3  HCT 38.0 42.0  MCV 92.2  --   PLT 250  --    Coagulation:   Recent Labs Lab 03/23/14 1147  LABPROT 13.6  INR 1.06   Cardiac Enzymes: No results found for this basename: CKTOTAL, CKMB, CKMBINDEX, TROPONINI,  in the last 168 hours Urinalysis:   Recent Labs Lab 03/23/14 1323  COLORURINE YELLOW  LABSPEC 1.013  PHURINE 6.5  GLUCOSEU NEGATIVE  HGBUR MODERATE*  BILIRUBINUR NEGATIVE  KETONESUR 15*  PROTEINUR NEGATIVE  UROBILINOGEN 1.0  NITRITE NEGATIVE  LEUKOCYTESUR MODERATE*   Lipid Panel    Component Value Date/Time   CHOL 179 03/24/2014 0510   TRIG 79 03/24/2014 0510   HDL 38* 03/24/2014 0510  HDL 43 01/21/2014 1023   CHOLHDL 4.7 03/24/2014 0510   CHOLHDL 4.5* 01/21/2014 1023   VLDL 16 03/24/2014 0510   LDLCALC 125* 03/24/2014 0510   LDLCALC 138* 01/21/2014 1023   HgbA1C  Lab Results  Component Value Date   HGBA1C 6.3* 03/24/2014    Urine Drug Screen:     Component Value Date/Time   LABOPIA NONE DETECTED 03/23/2014 1323   COCAINSCRNUR NONE DETECTED 03/23/2014 1323   LABBENZ NONE DETECTED 03/23/2014 1323   AMPHETMU NONE DETECTED 03/23/2014 1323   THCU NONE DETECTED 03/23/2014 1323   LABBARB NONE DETECTED 03/23/2014 1323    Alcohol Level:   Recent Labs Lab 03/23/14 1147  ETH <11    CT of the brain  03/23/2014    Subacute and chronic changes without evidence of acute abnormalities.  Involutional changes also appreciated.     MRI of the brain  03/23/2014   1. Acute right cerebral hemispheric infarcts as above. Findings could reflect watershed ischemia  or possibly emboli. 2. Moderate chronic small vessel ischemic disease and remote infarcts  MRA of the brain  03/23/2014    No evidence of major intracranial arterial occlusion. Overall similar appearance of widespread, severe intracranial atherosclerotic disease.   2D Echocardiogram  EF 55-60% with no source of embolus. Moderate focal thickening AV, mild MR  Carotid Doppler  No evidence of hemodynamically significant internal carotid artery stenosis. Vertebral artery flow is antegrade.   EKG  normal sinus rhythm. For complete results please see formal report.   TEE shows PFO but no clot Therapy Recommendations SNF  Physical Exam   Middle aged obese lady not in distress.Awake alert. Afebrile. Head is nontraumatic. Neck is supple without bruit. Hearing is normal. Cardiac exam no murmur or gallop. Lungs are clear to auscultation. Distal pulses are well felt. Neurological Exam : Awake alert oriented x 3 normal speech and language. Mild left lower face asymmetry. Tongue midline. No drift. Mild diminished fine finger movements on left. Orbits right over left upper extremity. Mild left grip weak.. Normal sensation . Normal coordination.  ASSESSMENT [name] is a 63 y.o. female presenting with transient left hand dysfunction. Imaging confirms a right parietal and occipital, cortical and subcortical infarcts. 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  For TEE today. On clopidogrel 75 mg orally every day prior to admission. Now on clopidogrel 75 mg orally every day for secondary stroke prevention. Stroke work up underway.  hypertension  Hyperlipidemia, LDL 125, on no statin PTA, now on lipitor 40 mg daily, goal LDL < 100 (< 70 for diabetics) Hyperglycemia, HgbA1c 7.1, likely new dx diabetes Obesity, Body mass index is 34.55 kg/(m^2).  Anxiety, on xanax at night. Hx depression, on Latuda in the past, followed by Dr. Gregary Cromer day # 3  TREATMENT/PLAN  Continue  clopidogrel 75 mg orally every day for secondary stroke prevention.  LE venous dopplers today If LE venous dopplers negative, a [place] electrophysiologist will consult and consider placement of an place implantable loop recorder to evaluate for atrial fibrillation as etiology of stroke. This has been explained to patient/family by Dr. Leonie Man and they are agreeable.  Resume latuda 20 mg daily as prior to admission  SNF recommended at discharge by therapists  Antony Contras, MD  03/26/2014 2:48 PM     To contact Stroke Continuity provider, please refer to [email or web address] After hours, contact General Neurology

## 2014-03-26 NOTE — Progress Notes (Signed)
CSW spoke with the Pt at the bedside for d/c planning.   CSW questioned Pt if Pt dtr had come to speak with her about the offers and make a decision for placement. Pt began to cry and stated "she did not come: I don't know when she will come." CSW explained to the Pt that she would need to speak with her daughter tonight and that a decision would need to be made in the morning for d/c. Pt again was tearful and stated "I'm anxious". CSW provided active listening and explained that CSW would attempt to contact the daughter for placement decision.    CSW spoke with Pt's daughter and she is wanting to see if Weekend CSW can contact [facility] and see if they would be willing to accept the Pt as it is right next to her house. If [facility] is unable to accept the Pt then the family would like [facility].   Weekend CSW to follow up.    [facility]  4N 1-16;  [phone] Phone: [phone]

## 2014-03-26 NOTE — Progress Notes (Signed)
Pt can be discharged per primary team

## 2014-03-26 NOTE — Progress Notes (Signed)
VASCULAR LAB PRELIMINARY  PRELIMINARY  PRELIMINARY  PRELIMINARY  Lower extremity venous duplex completed.    Preliminary report: Bilateral:  No evidence of DVT, superficial thrombosis, or Baker's Cyst.   Margarette Canada, RVT 03/26/2014, 1:39 PM

## 2014-03-26 NOTE — Progress Notes (Signed)
  CSW provided list of bed offers for SNF facilities.   CSW spoke with Pt's daughter concerning d/c planning and stressed the importance for a SNF choice today so that Pt can d/c when medically ready.   Pt's daughter stated that she will be coming to the hospital by 1 pm and can assist with that choice.  CSW will continue to follow Pt for d/c planning.    [facility]  4N 1-16;  6N1-16 Phone: [facility]  4N 1-16;  [phone] Phone: [phone]

## 2014-03-26 NOTE — CV Procedure (Signed)
Pre op Dx cryptogenic stroke Post op Dx    Procedure  Loop Recorder implantation  After routine prep and drape of the left parasternal area, a small incision was created. A Med[employer]onic LINQ Reveal Loop Recorder  Serial Number  [id number] was inserted.    Steristrips  applied.  The patient tolerated the procedure without apparent complication.

## 2014-03-27 LAB — CBC
HCT: 39.1 % (ref 36.0–46.0)
HEMOGLOBIN: 12.6 g/dL (ref 12.0–15.0)
MCH: 29.6 pg (ref 26.0–34.0)
MCHC: 32.2 g/dL (ref 30.0–36.0)
MCV: 92 fL (ref 78.0–100.0)
Platelets: 244 10*3/uL (ref 150–400)
RBC: 4.25 MIL/uL (ref 3.87–5.11)
RDW: 13.2 % (ref 11.5–15.5)
WBC: 3.8 10*3/uL — ABNORMAL LOW (ref 4.0–10.5)

## 2014-03-27 LAB — BASIC METABOLIC PANEL
BUN: 9 mg/dL (ref 6–23)
CO2: 23 mEq/L (ref 19–32)
CREATININE: 1.06 mg/dL (ref 0.50–1.10)
Calcium: 9.6 mg/dL (ref 8.4–10.5)
Chloride: 104 mEq/L (ref 96–112)
GFR, EST AFRICAN AMERICAN: 64 mL/min — AB (ref >90)
GFR, EST NON AFRICAN AMERICAN: 55 mL/min — AB (ref >90)
Glucose, Bld: 96 mg/dL (ref 70–99)
POTASSIUM: 4.7 meq/L (ref 3.7–5.3)
Sodium: 142 mEq/L (ref 137–147)

## 2014-03-27 MED ORDER — ALUM & MAG HYDROXIDE-SIMETH 200-200-20 MG/5ML PO SUSP
30.0000 mL | Freq: Once | ORAL | Status: AC
Start: 2014-03-27 — End: 2014-03-27
  Administered 2014-03-27: 30 mL via ORAL
  Filled 2014-03-27: qty 30

## 2014-03-27 NOTE — Progress Notes (Signed)
Stroke Team Progress Note  HISTORY Deborah Whitehead is a 63 y.o. female with a history of hypertension who presented to the emergency room 03/23/2014 at 0137 with transient left hand dysfunction. She had abnormal behavior in the emergency room, but her daughter stated that this was normal because the patient suffers from extreme anxiety when near doctors. She was diagnosed with TIA versus anxiety and discharged from the emergency room.  She again had more difficulty with left side weakness on the morning of 03/23/2014 and has returned at 1138. She had an MRI which showed a right posterior stroke. She was unfortunately unable to give Dr. Leonel Ramsay much in the way of history due to having been sedated with Ativan for her MRI.  She was last known well 03/22/2014 in the afternoon. Patient was not administered TPA secondary to unknown time of onset and delay in arrival. She was admitted for further evaluation and treatment.  SUBJECTIVE No complaints except "nerves". She feels she is getting stronger  OBJECTIVE Most recent Vital Signs: Filed Vitals:   03/26/14 1709 03/26/14 2145 03/27/14 0210 03/27/14 0525  BP: 126/67 152/78 143/83 167/86  Pulse: 63 81 70 65  Temp: 98.2 F (36.8 C) 98.1 F (36.7 C) 98.2 F (36.8 C) 98.4 F (36.9 C)  TempSrc: Oral Oral Oral Oral  Resp: 20 18 20 20   Height:      Weight:      SpO2: 99% 98% 97% 99%   CBG (last 3)  No results found for this basename: GLUCAP,  in the last 72 hours  IV Fluid Intake:   . sodium chloride 75 mL/hr at 03/23/14 2223    MEDICATIONS  . atorvastatin  40 mg Oral q1800  . clopidogrel  75 mg Oral Q breakfast  . enoxaparin (LOVENOX) injection  40 mg Subcutaneous Q24H  . lurasidone  20 mg Oral Q breakfast  . meloxicam  15 mg Oral Daily  . metoprolol  50 mg Oral Daily   PRN:  ALPRAZolam, ibuprofen, senna-docusate  Diet:  Cardiac  Activity:  OOB  with assistance DVT Prophylaxis:  Lovenox 40 mg sq daily   CLINICALLY SIGNIFICANT  STUDIES Basic Metabolic Panel:   Recent Labs Lab 03/23/14 1147 03/23/14 1223  NA 146 142  K 3.9 3.7  CL 103 99  CO2 28  --   GLUCOSE 89 92  BUN 9 8  CREATININE 0.97 1.10  CALCIUM 9.8  --    Liver Function Tests:   Recent Labs Lab 03/23/14 1147  AST 31  ALT 18  ALKPHOS 111  BILITOT 0.6  PROT 8.0  ALBUMIN 3.7   CBC:   Recent Labs Lab 03/23/14 1147 03/23/14 1223 03/27/14 0530  WBC 5.6  --  3.8*  NEUTROABS 3.5  --   --   HGB 12.5 14.3 12.6  HCT 38.0 42.0 39.1  MCV 92.2  --  92.0  PLT 250  --  244   Coagulation:   Recent Labs Lab 03/23/14 1147  LABPROT 13.6  INR 1.06   Cardiac Enzymes: No results found for this basename: CKTOTAL, CKMB, CKMBINDEX, TROPONINI,  in the last 168 hours Urinalysis:   Recent Labs Lab 03/23/14 1323  COLORURINE YELLOW  LABSPEC 1.013  PHURINE 6.5  GLUCOSEU NEGATIVE  HGBUR MODERATE*  BILIRUBINUR NEGATIVE  KETONESUR 15*  PROTEINUR NEGATIVE  UROBILINOGEN 1.0  NITRITE NEGATIVE  LEUKOCYTESUR MODERATE*   Lipid Panel    Component Value Date/Time   CHOL 179 03/24/2014 0510   TRIG 79 03/24/2014  0510   HDL 38* 03/24/2014 0510   HDL 43 01/21/2014 1023   CHOLHDL 4.7 03/24/2014 0510   CHOLHDL 4.5* 01/21/2014 1023   VLDL 16 03/24/2014 0510   LDLCALC 125* 03/24/2014 0510   LDLCALC 138* 01/21/2014 1023   HgbA1C  Lab Results  Component Value Date   HGBA1C 6.3* 03/24/2014    Urine Drug Screen:     Component Value Date/Time   LABOPIA NONE DETECTED 03/23/2014 1323   COCAINSCRNUR NONE DETECTED 03/23/2014 1323   LABBENZ NONE DETECTED 03/23/2014 1323   AMPHETMU NONE DETECTED 03/23/2014 1323   THCU NONE DETECTED 03/23/2014 1323   LABBARB NONE DETECTED 03/23/2014 1323    Alcohol Level:   Recent Labs Lab 03/23/14 1147  ETH <11    CT of the brain  03/23/2014    Subacute and chronic changes without evidence of acute abnormalities.  Involutional changes also appreciated.     MRI of the brain  03/23/2014   1. Acute right cerebral hemispheric infarcts.   Findings could reflect watershed ischemia or possibly emboli. 2. Moderate chronic small vessel ischemic disease and remote infarcts  MRA of the brain  03/23/2014    No evidence of major intracranial arterial occlusion. Overall similar appearance of widespread, severe intracranial atherosclerotic disease.   2D Echocardiogram  EF 55-60% with no source of embolus. Moderate focal thickening AV, mild MR  Carotid Doppler  No evidence of hemodynamically significant internal carotid artery stenosis. Vertebral artery flow is antegrade.   EKG  normal sinus rhythm. For complete results please see formal report.    TEE shows PFO but no clot  Therapy Recommendations SNF  Physical Exam   Middle aged obese lady not in distress.Awake alert. Afebrile. Head is nontraumatic. Neck is supple without bruit. Hearing is normal. Cardiac exam no murmur or gallop. Lungs are clear to auscultation. Distal pulses are well felt. Neurological Exam : Awake alert oriented x 3 normal speech and language. Able to follow 3 step commands. Mild left lower face asymmetry. Tongue midline. No drift. Mild diminished fine finger movements on left. Orbits right over left upper extremity. Mild left grip weak.. Normal sensation . Normal coordination.  ASSESSMENT Deborah Whitehead is a 63 y.o. female presenting with transient left hand dysfunction. Imaging confirms a right parietal and occipital, cortical and subcortical infarcts. 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 On clopidogrel 75 mg orally every day prior to admission. Now on clopidogrel 75 mg orally every day for secondary stroke prevention. Stroke work up underway.  hypertension  Hyperlipidemia, LDL 125, on no statin PTA, now on lipitor 40 mg daily, goal LDL < 100 (< 70 for diabetics) Hyperglycemia, HgbA1c 7.1, likely new dx diabetes Obesity, Body mass index is 34.55 kg/(m^2).  Anxiety, on xanax at night. Hx depression, on Latuda in the past, followed by Dr. Irven Shelling TEE - PFO but no clot. Lower extremity venous duplex - no evidence of thrombosis.  Loop recorder implanted 03/26/2009 Dr. Avis Epley day # 4  TREATMENT/PLAN  Continue clopidogrel 75 mg orally every day for secondary stroke prevention. Resume latuda 20 mg daily as prior to admission  SNF recommended at discharge by therapists  Cardiology to see patient regarding oozing at Loop insertion site. Plavix and Lovenox held.  Will sign off. Follow up Dr Leonie Man in 2 months. Please call if we can be of further service.  Mikey Bussing PA-C [facility] Pager [phone] 03/27/2014, 8:27 AM Kathrynn Ducking  To contact Stroke Continuity provider, please refer to [email or web address] After hours, contact General Neurology

## 2014-03-27 NOTE — Progress Notes (Signed)
[facility]                                                                              Patient Demographics  Deborah Whitehead, is a 63 y.o. female, DOB - 02-03-1951, WUJ:[id number]  Admit date - 03/23/2014   Admitting Physician Deborah Molder, MD  Outpatient Primary MD for the patient is Deborah Garbe, MD  LOS - 4   Chief Complaint  Patient presents with  . Aphasia        Assessment & Plan  Acute CVA -MRI of the brain: acute right cerebral hemispheric infarct, possibly embolic -Neurology consulted and following -Echo: 55-60%, no source of embolus, grade 1 diastolic dysfunction -Carotid doppler: No evidence of hemodynamically significant internal carotid artery stenosis. Vertebral artery flow is antegrade -TEE: no thrombus or vegetation, EF 55%, PFO -LE doppler: No DVT. -Hemoglobin A1c 6.3 -Lipid panel: 179/79/38/125, on atorvastatin -PT/OT/Speech consulted for evaluation and treatment -Continue atorvastatin and Plavix for secondary stroke prevention -Patient received loop recorder, however oozing, cardiology to follow  Hypertension -Continue metoprolol -HCTZ currently held  Obesity -Will need outpatient counseling for weight reduction exercise program  Anxiety -Continue Xanax and latuda  Code Status: Full  Family Communication: None at bedside  Disposition Plan: Admitted, pending SNF placement  Time Spent in minutes   25 minutes  Procedures  2D echocardiogram (TTE) Study Conclusions  - Left ventricle: The cavity size was normal. There was mild concentric hypertrophy. Systolic function was normal. The estimated ejection fraction was in the range of 55% to 60%. Wall motion was normal; there were no regional wall motion abnormalities. There was an increased relative contribution of atrial contraction to ventricular filling. Doppler parameters are consistent with abnormal left ventricular relaxation (grade 1 diastolic dysfunction). - Aortic valve:  Moderate focal thickening and calcification, consistent with sclerosis. - Mitral valve: Mild regurgitation.  Carotid Doppler Summary:  The vertebral arteries appear patent with antegrade flow. Findings consistent with 1-39 percent stenosis involving the right internal carotid artery and the left internal carotid artery.  Transesophageal echocardiogram (TEE) Positive PFO by an agitated saline (bubble study), No thrombus in LA/LAA, EF 55-60%  Lower Ext Doppler No evidence of deep vein or superficial thrombosis involving the right lower extremity and left lower extremity.  Consults   Neurology Cardiology  DVT Prophylaxis  Lovenox   Lab Results  Component Value Date   PLT 244 03/27/2014    Medications  Scheduled Meds: . atorvastatin  40 mg Oral q1800  . clopidogrel  75 mg Oral Q breakfast  . enoxaparin (LOVENOX) injection  40 mg Subcutaneous Q24H  . lurasidone  20 mg Oral Q breakfast  . meloxicam  15 mg Oral Daily  . metoprolol  50 mg Oral Daily   Continuous Infusions: . sodium chloride 75 mL/hr at 03/23/14 2223   PRN Meds:.ALPRAZolam, ibuprofen, senna-docusate  Antibiotics    Anti-infectives   None      Subjective:   Deborah Whitehead seen and examined today.  Patient has no complaints however, she is still worried.  She feels less anxious.   Objective:   Filed Vitals:   03/26/14 1709 03/26/14 2145 03/27/14 0210 03/27/14 0525  BP: 126/67  152/78 143/83 167/86  Pulse: 63 81 70 65  Temp: 98.2 F (36.8 C) 98.1 F (36.7 C) 98.2 F (36.8 C) 98.4 F (36.9 C)  TempSrc: Oral Oral Oral Oral  Resp: 20 18 20 20   Height:      Weight:      SpO2: 99% 98% 97% 99%    Wt Readings from Last 3 Encounters:  03/23/14 94.167 kg (207 lb 9.6 oz)  03/23/14 94.167 kg (207 lb 9.6 oz)  03/23/14 94.167 kg (207 lb 9.6 oz)     Intake/Output Summary (Last 24 hours) at 03/27/14 0746 Last data filed at 03/26/14 0830  Gross per 24 hour  Intake      0 ml  Output      1 ml  Net      -1 ml    Exam  General: Well developed, well nourished, NAD, appears stated age  HEENT: NCAT, mucous membranes moist.   Neck: Supple, no JVD, no masses  Cardiovascular: S1 S2 auscultated, no rubs, murmurs or gallops. Regular rate and rhythm. Bandage noted on left chest wall.  Respiratory: Clear to auscultation bilaterally with equal chest rise  Abdomen: Soft, nontender, nondistended, + bowel sounds  Extremities: warm dry without cyanosis clubbing or edema  Neuro: AAOx3, Normal speech, some facial asymmetry. Left hand grip weak as compared to right  Skin: Without rashes exudates or nodules  Psych: Anxious  Data Review   Micro Results No results found for this or any previous visit (from the past 240 hour(s)).  Radiology Reports Ct Head Wo Contrast  03/23/2014   CLINICAL DATA:  APHASIA  EXAM: CT HEAD WITHOUT CONTRAST  TECHNIQUE: Contiguous axial images were obtained from the base of the skull through the vertex without intravenous contrast.  COMPARISON:  CT HEAD WO/W CM dated 01/07/2014  FINDINGS: No acute intracranial abnormality. Specifically, no hemorrhage, hydrocephalus, mass lesion, acute infarction, or significant intracranial injury. No acute calvarial abnormality. Chronic areas of lacunar infarction within the vertex of the right frontal lobe and right occipital lobe. An area of subacute to chronic infarction within the posterior periphery of the right parietal occipital region image 18 series 2. This finding occurred in the interim compared to prior. Diffuse areas of low attenuation within the subcortical, deep, and periventricular white matter regions consistent small vessel white matter ischaemia. Ventricles and cisterns are patent. Visualized paranasal sinuses and mastoid air cells are patent. There is mild diffuse cortical atrophy.  IMPRESSION: Subacute and chronic changes without evidence of acute abnormalities.  Involutional changes also appreciated.   Electronically  Signed   By: Deborah Whitehead M.D.   On: 03/23/2014 13:18   Mr Deborah Whitehead Head Wo Contrast  03/23/2014   CLINICAL DATA:  History of hypertension and prior strokes presenting with acute onset of slurred speech and left upper extremity weakness. Possible stroke.  EXAM: MRI HEAD WITHOUT CONTRAST  MRA HEAD WITHOUT CONTRAST  TECHNIQUE: Multiplanar, multiecho pulse sequences of the brain and surrounding structures were obtained without intravenous contrast. Angiographic images of the head were obtained using MRA technique without contrast.  COMPARISON:  Head CT 03/23/2014.  Head MRI and MRA 07/11/2013.  FINDINGS: MRI HEAD FINDINGS  There are multiple regions of acute infarction in the right cerebral hemisphere. The largest involvement is cortical and subcortical right parietal and occipital lobes. There is also involvement of the right frontal lobe near the vertex extending inferiorly into the centrum semiovale. Some of these regions of acute infarction are in a similar distribution to  those on the prior MRI. There is a punctate focus of susceptibility artifact in the region of the right frontal operculum suggestive of a microhemorrhage. There is no other evidence of intracranial hemorrhage. Coarse calcification is noted along the left aspect of the tentorium, unchanged.  Multiple foci of T2 hyperintensity are again seen within the subcortical and deep cerebral white matter bilaterally, compatible with moderate chronic small vessel ischemic disease. Encephalomalacia is seen in the right frontal lobe near the vertex. Chronic ischemic changes are again seen in the pons, unchanged. Remote lacunar infarcts are again noted in the basal ganglia and thalamus.  Major intracranial vascular flow voids are preserved. Prior right cataract surgery is noted. Paranasal sinuses and mastoid air cells are clear.  MRA HEAD FINDINGS  The visualized distal vertebral arteries are patent with the left being dominant. There is mild focal narrowing of  the proximal right V4 segment. The basilar artery is patent without high-grade stenosis but with mild, diffuse irregularity. Moderate irregularity is again seen involving the PCAs bilaterally. There is a patent left posterior communicating artery.  Internal carotid arteries are patent from skullbase to carotid termini. The bilateral carotid siphon a regular narrowing is again seen, greater on the right with moderate narrowing of the supraclinoid right ICA, similar to prior. ACA and MCA origins are patent. Moderate, diffuse ACA and MCA irregularity is again seen and is similar to prior.  IMPRESSION: 1. Acute right cerebral hemispheric infarcts as above. Findings could reflect watershed ischemia or possibly emboli. 2. Moderate chronic small vessel ischemic disease and remote infarcts as above. 3. No evidence of major intracranial arterial occlusion. Overall similar appearance of widespread, severe intracranial atherosclerotic disease.   Electronically Signed   By: Logan Bores   On: 03/23/2014 19:17   Mr Brain Wo Contrast  03/23/2014   CLINICAL DATA:  History of hypertension and prior strokes presenting with acute onset of slurred speech and left upper extremity weakness. Possible stroke.  EXAM: MRI HEAD WITHOUT CONTRAST  MRA HEAD WITHOUT CONTRAST  TECHNIQUE: Multiplanar, multiecho pulse sequences of the brain and surrounding structures were obtained without intravenous contrast. Angiographic images of the head were obtained using MRA technique without contrast.  COMPARISON:  Head CT 03/23/2014.  Head MRI and MRA 07/11/2013.  FINDINGS: MRI HEAD FINDINGS  There are multiple regions of acute infarction in the right cerebral hemisphere. The largest involvement is cortical and subcortical right parietal and occipital lobes. There is also involvement of the right frontal lobe near the vertex extending inferiorly into the centrum semiovale. Some of these regions of acute infarction are in a similar distribution to those  on the prior MRI. There is a punctate focus of susceptibility artifact in the region of the right frontal operculum suggestive of a microhemorrhage. There is no other evidence of intracranial hemorrhage. Coarse calcification is noted along the left aspect of the tentorium, unchanged.  Multiple foci of T2 hyperintensity are again seen within the subcortical and deep cerebral white matter bilaterally, compatible with moderate chronic small vessel ischemic disease. Encephalomalacia is seen in the right frontal lobe near the vertex. Chronic ischemic changes are again seen in the pons, unchanged. Remote lacunar infarcts are again noted in the basal ganglia and thalamus.  Major intracranial vascular flow voids are preserved. Prior right cataract surgery is noted. Paranasal sinuses and mastoid air cells are clear.  MRA HEAD FINDINGS  The visualized distal vertebral arteries are patent with the left being dominant. There is mild focal narrowing of the  proximal right V4 segment. The basilar artery is patent without high-grade stenosis but with mild, diffuse irregularity. Moderate irregularity is again seen involving the PCAs bilaterally. There is a patent left posterior communicating artery.  Internal carotid arteries are patent from skullbase to carotid termini. The bilateral carotid siphon a regular narrowing is again seen, greater on the right with moderate narrowing of the supraclinoid right ICA, similar to prior. ACA and MCA origins are patent. Moderate, diffuse ACA and MCA irregularity is again seen and is similar to prior.  IMPRESSION: 1. Acute right cerebral hemispheric infarcts as above. Findings could reflect watershed ischemia or possibly emboli. 2. Moderate chronic small vessel ischemic disease and remote infarcts as above. 3. No evidence of major intracranial arterial occlusion. Overall similar appearance of widespread, severe intracranial atherosclerotic disease.   Electronically Signed   By: Logan Bores   On:  03/23/2014 19:17    CBC  Recent Labs Lab 03/23/14 0230 03/23/14 1147 03/23/14 1223 03/27/14 0530  WBC  --  5.6  --  3.8*  HGB 14.3 12.5 14.3 12.6  HCT 42.0 38.0 42.0 39.1  PLT  --  250  --  244  MCV  --  92.2  --  92.0  MCH  --  30.3  --  29.6  MCHC  --  32.9  --  32.2  RDW  --  13.3  --  13.2  LYMPHSABS  --  1.6  --   --   MONOABS  --  0.4  --   --   EOSABS  --  0.1  --   --   BASOSABS  --  0.0  --   --     Chemistries   Recent Labs Lab 03/23/14 0230 03/23/14 1147 03/23/14 1223  NA 143 146 142  K 3.8 3.9 3.7  CL 99 103 99  CO2  --  28  --   GLUCOSE 103* 89 92  BUN 9 9 8   CREATININE 1.10 0.97 1.10  CALCIUM  --  9.8  --   AST  --  31  --   ALT  --  18  --   ALKPHOS  --  111  --   BILITOT  --  0.6  --    ------------------------------------------------------------------------------------------------------------------ estimated creatinine clearance is 60.2 ml/min (by C-G formula based on Cr of 1.1). ------------------------------------------------------------------------------------------------------------------ No results found for this basename: HGBA1C,  in the last 72 hours ------------------------------------------------------------------------------------------------------------------ No results found for this basename: CHOL, HDL, LDLCALC, TRIG, CHOLHDL, LDLDIRECT,  in the last 72 hours ------------------------------------------------------------------------------------------------------------------ No results found for this basename: TSH, T4TOTAL, FREET3, T3FREE, THYROIDAB,  in the last 72 hours ------------------------------------------------------------------------------------------------------------------ No results found for this basename: VITAMINB12, FOLATE, FERRITIN, TIBC, IRON, RETICCTPCT,  in the last 72 hours  Coagulation profile  Recent Labs Lab 03/23/14 1147  INR 1.06    No results found for this basename: DDIMER,  in the last 72  hours  Cardiac Enzymes No results found for this basename: CK, CKMB, TROPONINI, MYOGLOBIN,  in the last 168 hours ------------------------------------------------------------------------------------------------------------------ No components found with this basename: POCBNP,     Jartavious Mckimmy D.O. on 03/27/2014 at 7:46 AM  Between 7am to 7pm - Pager - [phone]  After 7pm go to [email or web address] - password [email or web address]  And look for the night coverage person covering for me after hours  [facility] Office  [phone]

## 2014-03-27 NOTE — Progress Notes (Signed)
Patient had loop recorder placed to left chest 03/26/14.Dressing is saturated and oozing bright red blood with clots.  Pressure dsg applied.Vital signs stable. No signs of acute distress. Spoke to [place] and was advised to call cardiologist. Spoke with Dr. Radford Pax new orders to hold am plavix and lovenox until patient is seen by cardio team this am. Both meds put on hold on MAR.

## 2014-03-28 MED ORDER — PANTOPRAZOLE SODIUM 40 MG PO TBEC
40.0000 mg | DELAYED_RELEASE_TABLET | Freq: Every day | ORAL | Status: DC
  Administered 2014-03-28 – 2014-03-29 (×2): 40 mg via ORAL
  Filled 2014-03-28 (×2): qty 1

## 2014-03-28 MED ORDER — ONDANSETRON 4 MG PO TBDP: 4.0000 mg | ORAL_TABLET | Freq: Four times a day (QID) | ORAL | Status: DC | PRN

## 2014-03-28 NOTE — Progress Notes (Signed)
[facility]                                                                              Patient Demographics  Deborah Whitehead, is a 63 y.o. female, DOB - 08-31-1951, UKG:[id number]  Admit date - 03/23/2014   Admitting Physician No admitting provider for patient encounter.  Outpatient Primary MD for the patient is Kristine Garbe, MD  LOS - 5   Chief Complaint  Patient presents with  . Aphasia        Assessment & Plan  Acute CVA -MRI of the brain: acute right cerebral hemispheric infarct, possibly embolic -Neurology consulted and following -Echo: 55-60%, no source of embolus, grade 1 diastolic dysfunction -Carotid doppler: No evidence of hemodynamically significant internal carotid artery stenosis. Vertebral artery flow is antegrade -TEE: no thrombus or vegetation, EF 55%, PFO -LE doppler: No DVT. -Hemoglobin A1c 6.3 -Lipid panel: 179/79/38/125, on atorvastatin -PT/OT/Speech consulted for evaluation and treatment -Continue atorvastatin and Plavix for secondary stroke prevention -Patient received loop recorder, no longer oozing  Hypertension -Continue metoprolol -HCTZ currently held  Obesity -Will need outpatient counseling for weight reduction exercise program  Anxiety -Continue Xanax and latuda  Code Status: Full  Family Communication: None at bedside  Disposition Plan: Admitted, pending SNF placement  Time Spent in minutes   25 minutes  Procedures  2D echocardiogram (TTE) Study Conclusions  - Left ventricle: The cavity size was normal. There was mild concentric hypertrophy. Systolic function was normal. The estimated ejection fraction was in the range of 55% to 60%. Wall motion was normal; there were no regional wall motion abnormalities. There was an increased relative contribution of atrial contraction to ventricular filling. Doppler parameters are consistent with abnormal left ventricular relaxation (grade 1 diastolic dysfunction). - Aortic  valve: Moderate focal thickening and calcification, consistent with sclerosis. - Mitral valve: Mild regurgitation.  Carotid Doppler Summary:  The vertebral arteries appear patent with antegrade flow. Findings consistent with 1-39 percent stenosis involving the right internal carotid artery and the left internal carotid artery.  Transesophageal echocardiogram (TEE) Positive PFO by an agitated saline (bubble study), No thrombus in LA/LAA, EF 55-60%  Lower Ext Doppler No evidence of deep vein or superficial thrombosis involving the right lower extremity and left lower extremity.  Implantation of Loop Recorder  Consults   Neurology Cardiology  DVT Prophylaxis  Lovenox   Lab Results  Component Value Date   PLT 244 03/27/2014    Medications  Scheduled Meds: . atorvastatin  40 mg Oral q1800  . clopidogrel  75 mg Oral Q breakfast  . enoxaparin (LOVENOX) injection  40 mg Subcutaneous Q24H  . lurasidone  20 mg Oral Q breakfast  . meloxicam  15 mg Oral Daily  . metoprolol  50 mg Oral Daily   Continuous Infusions: . sodium chloride 75 mL/hr at 03/23/14 2223   PRN Meds:.ALPRAZolam, ibuprofen, senna-docusate  Antibiotics    Anti-infectives   None      Subjective:   Deborah Whitehead seen and examined today.  Patient has no complaints however, she is still worried.  She feels less anxious.  She states she does not want to be on Latuda.  Objective:   Filed Vitals:   03/27/14 1800 03/27/14 2105 03/28/14 0109 03/28/14 0524  BP: 149/96 147/83 149/85 141/90  Pulse: 72 64 71 66  Temp: 97.7 F (36.5 C) 97.9 F (36.6 C) 98.3 F (36.8 C) 98.1 F (36.7 C)  TempSrc: Oral Oral Oral Oral  Resp: 20 18 18 16   Height:      Weight:      SpO2: 100% 100% 98% 100%    Wt Readings from Last 3 Encounters:  03/23/14 94.167 kg (207 lb 9.6 oz)  03/23/14 94.167 kg (207 lb 9.6 oz)  03/23/14 94.167 kg (207 lb 9.6 oz)     Intake/Output Summary (Last 24 hours) at 03/28/14 0731 Last  data filed at 03/27/14 1700  Gross per 24 hour  Intake    600 ml  Output      0 ml  Net    600 ml    Exam  General: Well developed, well nourished, NAD, appears stated age  HEENT: NCAT, mucous membranes moist.   Neck: Supple, no JVD, no masses  Cardiovascular: S1 S2 auscultated, no rubs, murmurs or gallops. Regular rate and rhythm. Bandage noted on left chest wall.  Respiratory: Clear to auscultation bilaterally with equal chest rise  Abdomen: Soft, nontender, nondistended, + bowel sounds  Extremities: warm dry without cyanosis clubbing or edema  Neuro: AAOx3, Normal speech, some facial asymmetry. Left hand grip < right  Skin: Without rashes exudates or nodules  Psych: Anxious, intact judgement  Data Review   Micro Results No results found for this or any previous visit (from the past 240 hour(s)).  Radiology Reports Ct Head Wo Contrast  03/23/2014   CLINICAL DATA:  APHASIA  EXAM: CT HEAD WITHOUT CONTRAST  TECHNIQUE: Contiguous axial images were obtained from the base of the skull through the vertex without intravenous contrast.  COMPARISON:  CT HEAD WO/W CM dated 01/07/2014  FINDINGS: No acute intracranial abnormality. Specifically, no hemorrhage, hydrocephalus, mass lesion, acute infarction, or significant intracranial injury. No acute calvarial abnormality. Chronic areas of lacunar infarction within the vertex of the right frontal lobe and right occipital lobe. An area of subacute to chronic infarction within the posterior periphery of the right parietal occipital region image 18 series 2. This finding occurred in the interim compared to prior. Diffuse areas of low attenuation within the subcortical, deep, and periventricular white matter regions consistent small vessel white matter ischaemia. Ventricles and cisterns are patent. Visualized paranasal sinuses and mastoid air cells are patent. There is mild diffuse cortical atrophy.  IMPRESSION: Subacute and chronic changes without  evidence of acute abnormalities.  Involutional changes also appreciated.   Electronically Signed   By: Margaree Mackintosh M.D.   On: 03/23/2014 13:18   Mr Jodene Nam Head Wo Contrast  03/23/2014   CLINICAL DATA:  History of hypertension and prior strokes presenting with acute onset of slurred speech and left upper extremity weakness. Possible stroke.  EXAM: MRI HEAD WITHOUT CONTRAST  MRA HEAD WITHOUT CONTRAST  TECHNIQUE: Multiplanar, multiecho pulse sequences of the brain and surrounding structures were obtained without intravenous contrast. Angiographic images of the head were obtained using MRA technique without contrast.  COMPARISON:  Head CT 03/23/2014.  Head MRI and MRA 07/11/2013.  FINDINGS: MRI HEAD FINDINGS  There are multiple regions of acute infarction in the right cerebral hemisphere. The largest involvement is cortical and subcortical right parietal and occipital lobes. There is also involvement of the right frontal lobe near the vertex extending inferiorly into the centrum  semiovale. Some of these regions of acute infarction are in a similar distribution to those on the prior MRI. There is a punctate focus of susceptibility artifact in the region of the right frontal operculum suggestive of a microhemorrhage. There is no other evidence of intracranial hemorrhage. Coarse calcification is noted along the left aspect of the tentorium, unchanged.  Multiple foci of T2 hyperintensity are again seen within the subcortical and deep cerebral white matter bilaterally, compatible with moderate chronic small vessel ischemic disease. Encephalomalacia is seen in the right frontal lobe near the vertex. Chronic ischemic changes are again seen in the pons, unchanged. Remote lacunar infarcts are again noted in the basal ganglia and thalamus.  Major intracranial vascular flow voids are preserved. Prior right cataract surgery is noted. Paranasal sinuses and mastoid air cells are clear.  MRA HEAD FINDINGS  The visualized distal  vertebral arteries are patent with the left being dominant. There is mild focal narrowing of the proximal right V4 segment. The basilar artery is patent without high-grade stenosis but with mild, diffuse irregularity. Moderate irregularity is again seen involving the PCAs bilaterally. There is a patent left posterior communicating artery.  Internal carotid arteries are patent from skullbase to carotid termini. The bilateral carotid siphon a regular narrowing is again seen, greater on the right with moderate narrowing of the supraclinoid right ICA, similar to prior. ACA and MCA origins are patent. Moderate, diffuse ACA and MCA irregularity is again seen and is similar to prior.  IMPRESSION: 1. Acute right cerebral hemispheric infarcts as above. Findings could reflect watershed ischemia or possibly emboli. 2. Moderate chronic small vessel ischemic disease and remote infarcts as above. 3. No evidence of major intracranial arterial occlusion. Overall similar appearance of widespread, severe intracranial atherosclerotic disease.   Electronically Signed   By: Logan Bores   On: 03/23/2014 19:17   Mr Brain Wo Contrast  03/23/2014   CLINICAL DATA:  History of hypertension and prior strokes presenting with acute onset of slurred speech and left upper extremity weakness. Possible stroke.  EXAM: MRI HEAD WITHOUT CONTRAST  MRA HEAD WITHOUT CONTRAST  TECHNIQUE: Multiplanar, multiecho pulse sequences of the brain and surrounding structures were obtained without intravenous contrast. Angiographic images of the head were obtained using MRA technique without contrast.  COMPARISON:  Head CT 03/23/2014.  Head MRI and MRA 07/11/2013.  FINDINGS: MRI HEAD FINDINGS  There are multiple regions of acute infarction in the right cerebral hemisphere. The largest involvement is cortical and subcortical right parietal and occipital lobes. There is also involvement of the right frontal lobe near the vertex extending inferiorly into the centrum  semiovale. Some of these regions of acute infarction are in a similar distribution to those on the prior MRI. There is a punctate focus of susceptibility artifact in the region of the right frontal operculum suggestive of a microhemorrhage. There is no other evidence of intracranial hemorrhage. Coarse calcification is noted along the left aspect of the tentorium, unchanged.  Multiple foci of T2 hyperintensity are again seen within the subcortical and deep cerebral white matter bilaterally, compatible with moderate chronic small vessel ischemic disease. Encephalomalacia is seen in the right frontal lobe near the vertex. Chronic ischemic changes are again seen in the pons, unchanged. Remote lacunar infarcts are again noted in the basal ganglia and thalamus.  Major intracranial vascular flow voids are preserved. Prior right cataract surgery is noted. Paranasal sinuses and mastoid air cells are clear.  MRA HEAD FINDINGS  The visualized distal vertebral arteries  are patent with the left being dominant. There is mild focal narrowing of the proximal right V4 segment. The basilar artery is patent without high-grade stenosis but with mild, diffuse irregularity. Moderate irregularity is again seen involving the PCAs bilaterally. There is a patent left posterior communicating artery.  Internal carotid arteries are patent from skullbase to carotid termini. The bilateral carotid siphon a regular narrowing is again seen, greater on the right with moderate narrowing of the supraclinoid right ICA, similar to prior. ACA and MCA origins are patent. Moderate, diffuse ACA and MCA irregularity is again seen and is similar to prior.  IMPRESSION: 1. Acute right cerebral hemispheric infarcts as above. Findings could reflect watershed ischemia or possibly emboli. 2. Moderate chronic small vessel ischemic disease and remote infarcts as above. 3. No evidence of major intracranial arterial occlusion. Overall similar appearance of widespread,  severe intracranial atherosclerotic disease.   Electronically Signed   By: Logan Bores   On: 03/23/2014 19:17    CBC  Recent Labs Lab 03/23/14 0230 03/23/14 1147 03/23/14 1223 03/27/14 0530  WBC  --  5.6  --  3.8*  HGB 14.3 12.5 14.3 12.6  HCT 42.0 38.0 42.0 39.1  PLT  --  250  --  244  MCV  --  92.2  --  92.0  MCH  --  30.3  --  29.6  MCHC  --  32.9  --  32.2  RDW  --  13.3  --  13.2  LYMPHSABS  --  1.6  --   --   MONOABS  --  0.4  --   --   EOSABS  --  0.1  --   --   BASOSABS  --  0.0  --   --     Chemistries   Recent Labs Lab 03/23/14 0230 03/23/14 1147 03/23/14 1223 03/27/14 0530  NA 143 146 142 142  K 3.8 3.9 3.7 4.7  CL 99 103 99 104  CO2  --  28  --  23  GLUCOSE 103* 89 92 96  BUN 9 9 8 9   CREATININE 1.10 0.97 1.10 1.06  CALCIUM  --  9.8  --  9.6  AST  --  31  --   --   ALT  --  18  --   --   ALKPHOS  --  111  --   --   BILITOT  --  0.6  --   --    ------------------------------------------------------------------------------------------------------------------ estimated creatinine clearance is 62.5 ml/min (by C-G formula based on Cr of 1.06). ------------------------------------------------------------------------------------------------------------------ No results found for this basename: HGBA1C,  in the last 72 hours ------------------------------------------------------------------------------------------------------------------ No results found for this basename: CHOL, HDL, LDLCALC, TRIG, CHOLHDL, LDLDIRECT,  in the last 72 hours ------------------------------------------------------------------------------------------------------------------ No results found for this basename: TSH, T4TOTAL, FREET3, T3FREE, THYROIDAB,  in the last 72 hours ------------------------------------------------------------------------------------------------------------------ No results found for this basename: VITAMINB12, FOLATE, FERRITIN, TIBC, IRON, RETICCTPCT,  in the  last 72 hours  Coagulation profile  Recent Labs Lab 03/23/14 1147  INR 1.06    No results found for this basename: DDIMER,  in the last 72 hours  Cardiac Enzymes No results found for this basename: CK, CKMB, TROPONINI, MYOGLOBIN,  in the last 168 hours ------------------------------------------------------------------------------------------------------------------ No components found with this basename: POCBNP,     Kiron Osmun D.O. on 03/28/2014 at 7:31 AM  Between 7am to 7pm - Pager - [phone]  After 7pm go to [email or web address] - password [email or web address]  And look  for the night coverage person covering for me after hours  [facility] Office  [phone]

## 2014-03-28 NOTE — Discharge Summary (Signed)
Physician Discharge Summary  HARBOR PASTER OEU:[id number] DOB: 1951-06-25 DOA: 03/23/2014  PCP: Kristine Garbe, MD  Admit date: 03/23/2014 Discharge date: 03/29/2014  Time spent: 45 minutes  Recommendations for Outpatient Follow-up:  Patient will be discharged to a skilled nursing facility. She is to followup with her primary care physician as well as a physician at the nursing home. Patient should also follow up with Dr. Leonie Man in 2 months. Patient should continue physical and occupational therapy as recommended by the nursing home facility. She should continue her medications as prescribed.  Discharge Diagnoses:  Principal Problem:   CVA (cerebral vascular accident) Active Problems:   HTN (hypertension)   Obesity, unspecified   Other and unspecified hyperlipidemia   TIA (transient ischemic attack)   Anxiety   Discharge Condition: Stable  Diet recommendation: Heart healthy  Filed Weights   03/23/14 1143 03/23/14 2036  Weight: 96.163 kg (212 lb) 94.167 kg (207 lb 9.6 oz)    History of present illness:  62 year old obese female with history of hypertension, multiple right-sided stroke in 2014 (discharged on Plavix and statin.), Anxiety, depression and GERD presented to the ED last evening with acute onset of slurred speech and left upper extremity weakness mainly with poor hand incoordination. Patient was evaluated in the ED and given clinically low suspicion for TIA it was discharged home. Symptoms resolved after patient went home. Patient however returned again with left upper extremity weakness, again with poor hand grip and aphasia that lasted for several minutes. Patient denied any dizziness, loss of consciousness or fall.  Patient denied headache, dizziness, fever, chills, chest pain, palpitations, SOB, abdominal pain, bowel or urinary symptoms. Denied change in weight or appetite. She reported frequent nausea and episodes of vomiting at home that has been ongoing for several  weeks. Denies any recent illness or travel. reports being compliant with medications.    Hospital Course:  Acute CVA  -MRI of the brain: acute right cerebral hemispheric infarct, possibly embolic  -Neurology consulted and following  -Echo: 55-60%, no source of embolus, grade 1 diastolic dysfunction  -Carotid doppler: No evidence of hemodynamically significant internal carotid artery stenosis. Vertebral artery flow is antegrade  -TEE: no thrombus or vegetation, EF 55%, PFO  -LE doppler: No DVT.  -Hemoglobin A1c 6.3  -Lipid panel: 179/79/38/125, on atorvastatin  -PT/OT/Speech consulted for evaluation and treatment  -Continue atorvastatin and Plavix for secondary stroke prevention  -Patient received loop recorder, no longer oozing  -Patient will discharge to SNF  -She will need to follow up with Dr. Leonie Man in 2 months  Hypertension  -Continue metoprolol and HCTZ  Obesity  -Will need outpatient counseling for weight reduction exercise program   Anxiety  -Continue Xanax and latuda   Procedures: 2D echocardiogram (TTE)  Study Conclusions  - Left ventricle: The cavity size was normal. There was mild concentric hypertrophy. Systolic function was normal. The estimated ejection fraction was in the range of 55% to 60%. Wall motion was normal; there were no regional wall motion abnormalities. There was an increased relative contribution of atrial contraction to ventricular filling. Doppler parameters are consistent with abnormal left ventricular relaxation (grade 1 diastolic dysfunction). - Aortic valve: Moderate focal thickening and calcification, consistent with sclerosis. - Mitral valve: Mild regurgitation.   Carotid Doppler  Summary: The vertebral arteries appear patent with antegrade flow. Findings consistent with 1-39 percent stenosis involving the right internal carotid artery and the left internal carotid artery.   Transesophageal echocardiogram (TEE)  Positive PFO by an  agitated  saline (bubble study), No thrombus in LA/LAA, EF 55-60%   Lower Ext Doppler No evidence of deep vein or superficial thrombosis involving the right lower extremity and left lower extremity.   Implantation of Loop Recorder  Consultations: Neurology  Cardiology   Discharge Exam: Filed Vitals:   03/29/14 0526  BP: 137/84  Pulse: 84  Temp: 97.8 F (36.6 C)  Resp: 18   Exam  General: Well developed, well nourished, NAD, appears stated age  HEENT: NCAT, mucous membranes moist.  Neck: Supple, no JVD, no masses  Cardiovascular: S1 S2 auscultated, no rubs, murmurs or gallops. Regular rate and rhythm.  Respiratory: Clear to auscultation bilaterally with equal chest rise  Abdomen: Soft, nontender, nondistended, + bowel sounds  Extremities: warm dry without cyanosis clubbing or edema  Neuro: AAOx3, Normal speech, some facial asymmetry. Left hand grip < right  Skin: Without rashes exudates or nodules  Psych: Anxious, intact judgement  Discharge Instructions      Discharge Orders   Future Appointments Provider Department Dept Phone   04/05/2014 3:00 PM [facility] [place] [facility] [phone]   Future Orders Complete By Expires   Diet - low sodium heart healthy  As directed    Discharge instructions  As directed    Increase activity slowly  As directed        Medication List         ALPRAZolam 0.25 MG tablet  Commonly known as:  XANAX  Take 1 tablet (0.25 mg total) by mouth 3 (three) times daily as needed for sleep or anxiety.     atorvastatin 40 MG tablet  Commonly known as:  LIPITOR  Take 1 tablet (40 mg total) by mouth daily at 6 PM.     clopidogrel 75 MG tablet  Commonly known as:  PLAVIX  Take 1 tablet (75 mg total) by mouth daily with breakfast.     hydrochlorothiazide 25 MG tablet  Commonly known as:  HYDRODIURIL  Take 25 mg by mouth daily.     ibuprofen 200 MG tablet  Commonly known as:  ADVIL,MOTRIN  Take 200 mg by mouth every 6  (six) hours as needed for moderate pain.     lurasidone 40 MG Tabs tablet  Commonly known as:  LATUDA  Take 20 mg by mouth daily with breakfast.     meloxicam 15 MG tablet  Commonly known as:  MOBIC  Take 15 mg by mouth daily.     metoprolol 50 MG tablet  Commonly known as:  LOPRESSOR  Take 50 mg by mouth daily.       No Known Allergies Follow-up Information   Follow up with Forbes Cellar, MD. Schedule an appointment as soon as possible for a visit in 2 months.   Specialties:  Neurology, Radiology   Contact information:   [address] [place] [address] [phone]       Follow up with REESE,BETTI D, MD. Schedule an appointment as soon as possible for a visit in 1 week. [facility] follow up)    Specialty:  Family Medicine   Contact information:   [address], [place] [address] [phone]       Follow up with Physician at the nursing home.       The results of significant diagnostics from this hospitalization (including imaging, microbiology, ancillary and laboratory) are listed below for reference.    Significant Diagnostic Studies: Ct Head Wo Contrast  03/23/2014  CLINICAL DATA:  APHASIA  EXAM: CT HEAD WITHOUT CONTRAST  TECHNIQUE: Contiguous axial images were obtained from the base of the skull through the vertex without intravenous contrast.  COMPARISON:  CT HEAD WO/W CM dated 01/07/2014  FINDINGS: No acute intracranial abnormality. Specifically, no hemorrhage, hydrocephalus, mass lesion, acute infarction, or significant intracranial injury. No acute calvarial abnormality. Chronic areas of lacunar infarction within the vertex of the right frontal lobe and right occipital lobe. An area of subacute to chronic infarction within the posterior periphery of the right parietal occipital region image 18 series 2. This finding occurred in the interim compared to prior. Diffuse areas of low attenuation within the subcortical, deep, and  periventricular white matter regions consistent small vessel white matter ischaemia. Ventricles and cisterns are patent. Visualized paranasal sinuses and mastoid air cells are patent. There is mild diffuse cortical atrophy.  IMPRESSION: Subacute and chronic changes without evidence of acute abnormalities.  Involutional changes also appreciated.   Electronically Signed   By: Margaree Mackintosh M.D.   On: 03/23/2014 13:18   Mr Jodene Nam Head Wo Contrast  03/23/2014   CLINICAL DATA:  History of hypertension and prior strokes presenting with acute onset of slurred speech and left upper extremity weakness. Possible stroke.  EXAM: MRI HEAD WITHOUT CONTRAST  MRA HEAD WITHOUT CONTRAST  TECHNIQUE: Multiplanar, multiecho pulse sequences of the brain and surrounding structures were obtained without intravenous contrast. Angiographic images of the head were obtained using MRA technique without contrast.  COMPARISON:  Head CT 03/23/2014.  Head MRI and MRA 07/11/2013.  FINDINGS: MRI HEAD FINDINGS  There are multiple regions of acute infarction in the right cerebral hemisphere. The largest involvement is cortical and subcortical right parietal and occipital lobes. There is also involvement of the right frontal lobe near the vertex extending inferiorly into the centrum semiovale. Some of these regions of acute infarction are in a similar distribution to those on the prior MRI. There is a punctate focus of susceptibility artifact in the region of the right frontal operculum suggestive of a microhemorrhage. There is no other evidence of intracranial hemorrhage. Coarse calcification is noted along the left aspect of the tentorium, unchanged.  Multiple foci of T2 hyperintensity are again seen within the subcortical and deep cerebral white matter bilaterally, compatible with moderate chronic small vessel ischemic disease. Encephalomalacia is seen in the right frontal lobe near the vertex. Chronic ischemic changes are again seen in the pons,  unchanged. Remote lacunar infarcts are again noted in the basal ganglia and thalamus.  Major intracranial vascular flow voids are preserved. Prior right cataract surgery is noted. Paranasal sinuses and mastoid air cells are clear.  MRA HEAD FINDINGS  The visualized distal vertebral arteries are patent with the left being dominant. There is mild focal narrowing of the proximal right V4 segment. The basilar artery is patent without high-grade stenosis but with mild, diffuse irregularity. Moderate irregularity is again seen involving the PCAs bilaterally. There is a patent left posterior communicating artery.  Internal carotid arteries are patent from skullbase to carotid termini. The bilateral carotid siphon a regular narrowing is again seen, greater on the right with moderate narrowing of the supraclinoid right ICA, similar to prior. ACA and MCA origins are patent. Moderate, diffuse ACA and MCA irregularity is again seen and is similar to prior.  IMPRESSION: 1. Acute right cerebral hemispheric infarcts as above. Findings could reflect watershed ischemia or possibly emboli. 2. Moderate chronic small vessel ischemic disease and remote infarcts as above. 3. No  evidence of major intracranial arterial occlusion. Overall similar appearance of widespread, severe intracranial atherosclerotic disease.   Electronically Signed   By: Logan Bores   On: 03/23/2014 19:17   Mr Brain Wo Contrast  03/23/2014   CLINICAL DATA:  History of hypertension and prior strokes presenting with acute onset of slurred speech and left upper extremity weakness. Possible stroke.  EXAM: MRI HEAD WITHOUT CONTRAST  MRA HEAD WITHOUT CONTRAST  TECHNIQUE: Multiplanar, multiecho pulse sequences of the brain and surrounding structures were obtained without intravenous contrast. Angiographic images of the head were obtained using MRA technique without contrast.  COMPARISON:  Head CT 03/23/2014.  Head MRI and MRA 07/11/2013.  FINDINGS: MRI HEAD FINDINGS   There are multiple regions of acute infarction in the right cerebral hemisphere. The largest involvement is cortical and subcortical right parietal and occipital lobes. There is also involvement of the right frontal lobe near the vertex extending inferiorly into the centrum semiovale. Some of these regions of acute infarction are in a similar distribution to those on the prior MRI. There is a punctate focus of susceptibility artifact in the region of the right frontal operculum suggestive of a microhemorrhage. There is no other evidence of intracranial hemorrhage. Coarse calcification is noted along the left aspect of the tentorium, unchanged.  Multiple foci of T2 hyperintensity are again seen within the subcortical and deep cerebral white matter bilaterally, compatible with moderate chronic small vessel ischemic disease. Encephalomalacia is seen in the right frontal lobe near the vertex. Chronic ischemic changes are again seen in the pons, unchanged. Remote lacunar infarcts are again noted in the basal ganglia and thalamus.  Major intracranial vascular flow voids are preserved. Prior right cataract surgery is noted. Paranasal sinuses and mastoid air cells are clear.  MRA HEAD FINDINGS  The visualized distal vertebral arteries are patent with the left being dominant. There is mild focal narrowing of the proximal right V4 segment. The basilar artery is patent without high-grade stenosis but with mild, diffuse irregularity. Moderate irregularity is again seen involving the PCAs bilaterally. There is a patent left posterior communicating artery.  Internal carotid arteries are patent from skullbase to carotid termini. The bilateral carotid siphon a regular narrowing is again seen, greater on the right with moderate narrowing of the supraclinoid right ICA, similar to prior. ACA and MCA origins are patent. Moderate, diffuse ACA and MCA irregularity is again seen and is similar to prior.  IMPRESSION: 1. Acute right  cerebral hemispheric infarcts as above. Findings could reflect watershed ischemia or possibly emboli. 2. Moderate chronic small vessel ischemic disease and remote infarcts as above. 3. No evidence of major intracranial arterial occlusion. Overall similar appearance of widespread, severe intracranial atherosclerotic disease.   Electronically Signed   By: Logan Bores   On: 03/23/2014 19:17    Microbiology: No results found for this or any previous visit (from the past 240 hour(s)).   Labs: Basic Metabolic Panel:  Recent Labs Lab 03/23/14 0230 03/23/14 1147 03/23/14 1223 03/27/14 0530  NA 143 146 142 142  K 3.8 3.9 3.7 4.7  CL 99 103 99 104  CO2  --  28  --  23  GLUCOSE 103* 89 92 96  BUN 9 9 8 9   CREATININE 1.10 0.97 1.10 1.06  CALCIUM  --  9.8  --  9.6   Liver Function Tests:  Recent Labs Lab 03/23/14 1147  AST 31  ALT 18  ALKPHOS 111  BILITOT 0.6  PROT 8.0  ALBUMIN  3.7   No results found for this basename: LIPASE, AMYLASE,  in the last 168 hours No results found for this basename: AMMONIA,  in the last 168 hours CBC:  Recent Labs Lab 03/23/14 0230 03/23/14 1147 03/23/14 1223 03/27/14 0530  WBC  --  5.6  --  3.8*  NEUTROABS  --  3.5  --   --   HGB 14.3 12.5 14.3 12.6  HCT 42.0 38.0 42.0 39.1  MCV  --  92.2  --  92.0  PLT  --  250  --  244   Cardiac Enzymes: No results found for this basename: CKTOTAL, CKMB, CKMBINDEX, TROPONINI,  in the last 168 hours BNP: BNP (last 3 results) No results found for this basename: PROBNP,  in the last 8760 hours CBG: No results found for this basename: GLUCAP,  in the last 168 hours     Signed:   Cristal Ford  [facility] 03/29/2014, 10:16 AM

## 2014-03-29 MED ORDER — WHITE PETROLATUM GEL
Status: AC
  Administered 2014-03-29: 0.2
  Filled 2014-03-29: qty 5

## 2014-03-29 MED ORDER — ALPRAZOLAM 0.25 MG PO TABS: 0.2500 mg | ORAL_TABLET | Freq: Three times a day (TID) | ORAL | Status: DC | PRN

## 2014-03-29 MED ORDER — ATORVASTATIN CALCIUM 40 MG PO TABS: 40.0000 mg | ORAL_TABLET | Freq: Every day | ORAL | Status: DC

## 2014-03-29 NOTE — Progress Notes (Signed)
Occupational Therapy Treatment Patient Details Name: Deborah Whitehead MRN: [id number] DOB: 13-Dec-1951 Today's Date: 03/29/2014    History of present illness 63 y.o. s/p Acute right cerebral hemispheric infarcts   OT comments  Patient with left hemiparesis and mild left inattention, although progressing well with functional mobility and toward OT goals.  Patient was living with her daughter and attending adult day services from 9-2pm prior, as daughter works, and now needs increased physical assistance.  Patient plans to discharge to SNF for continued rehab.    Follow Up Recommendations  SNF;Supervision/Assistance - 24 hour    Equipment Recommendations  Other (comment) (to be determined in SNF)    Recommendations for Other Services      Precautions / Restrictions Precautions Precautions: Fall Restrictions Weight Bearing Restrictions: No       Mobility Bed Mobility                  Transfers Overall transfer level: Needs assistance Equipment used: Rolling walker (2 wheeled) Transfers: Sit to/from [facility] Sit to Stand: Min assist Stand pivot transfers: Min assist       General transfer comment: cues for UE use and getting closer prior to sitting.  pt needed MinA for coming to stand from low bench without armrests.      Balance Overall balance assessment: Needs assistance Sitting-balance support: Feet supported Sitting balance-Leahy Scale: Good     Standing balance support: Bilateral upper extremity supported;During functional activity Standing balance-Leahy Scale: Poor Standing balance comment: pt able to stand at sink, but requires leaning against sink to maintain balance during hand hygiene.                     ADL Overall ADL's : Needs assistance/impaired     Grooming: Wash/dry hands;Wash/dry face;Oral care;Standing;Minimal assistance                   Toilet Transfer: Minimal assistance;Regular Toilet;RW    Toileting- Clothing Manipulation and Hygiene: Minimal assistance;Sit to/from stand       Functional mobility during ADLs: Minimal assistance;Rolling walker        Vision                     Perception Perception Perception Tested?: No   Praxis Praxis Praxis tested?: Not tested    Cognition   Behavior During Therapy: Impulsive (mild impulsivity, mild left inattention) Overall Cognitive Status: Impaired/Different from baseline Area of Impairment: Attention;Safety/judgement   Current Attention Level: Selective      Safety/Judgement: Decreased awareness of safety     General Comments: Hits or nearly misses objects on left during functional mobility, slightly impulsive - eager to participate    Extremity/Trunk Assessment  Upper Extremity Assessment Upper Extremity Assessment: LUE deficits/detail LUE Deficits / Details: 110 degrees of shoulder flexion, extends thoracic spine to lift arm LUE Coordination: decreased fine motor;decreased gross motor       Cervical / Trunk Assessment Cervical / Trunk Assessment: Kyphotic    Exercises     Shoulder Instructions       General Comments      Pertinent Vitals/ Pain       VSS, denies pain  Home Living Family/patient expects to be discharged to:: Skilled nursing facility   Available Help at Discharge: [place] Type of Home: [place]  Prior Functioning/Environment              Frequency Min 2X/week     Progress Toward Goals  OT Goals(current goals can now be found in the care plan section)     Acute Rehab OT Goals Patient Stated Goal: Use left arm better OT Goal Formulation: With patient Time For Goal Achievement: 03/31/14 Potential to Achieve Goals: Good  Plan      Co-evaluation                 End of Session Equipment Utilized During Treatment: Rolling walker   Activity Tolerance Patient tolerated  treatment well   Patient Left in chair;with chair alarm set;with call bell/phone within reach   Nurse Communication          Time: 1130-1148 OT Time Calculation (min): 18 min  Charges: OT General Charges $OT Visit: 1 Procedure OT Treatments $Self Care/Home Management : 8-22 mins  Mariah Milling 03/29/2014, 11:58 AM

## 2014-03-29 NOTE — Progress Notes (Signed)
Report given to Caren Griffins from [facility]. All belongings sent with patient/family. She appears in stable condition for discharge. Transport by ambulance. Daughter at bedside.   Ave Filter, RN

## 2014-03-29 NOTE — Progress Notes (Signed)
  Pt to be d/c today to [facility]   Pt and family agreeable. Confirmed plans with facility.  Plan transfer via EMS.    [facility]  4N 1[address]16;  [phone] Phone: [phone]

## 2014-03-29 NOTE — Progress Notes (Signed)
  CSW received a call from [place] at [facility] and Pt can d/c to their facility today.    CSW sent d/c summary for d/c planning and informed the daughter to arrive around 2:00-2:30 for paperwork.   CSW will continue to follow Pt for d/c planning.    [facility]  4N 1[address]16;  [phone] Phone: [phone]

## 2014-03-29 NOTE — Progress Notes (Signed)
CSW  Left a second message (wknd left one as well) for Nikki from [facility] to take a look at Pt information for possible placement.    CSW is awaiting a call back and will prepare Pt to either go to [facility] or [facility] [place] 1[address]16;  8E4-23 Phone: [phone]

## 2014-03-29 NOTE — Discharge Instructions (Signed)

## 2014-03-29 NOTE — Progress Notes (Signed)
  CSW spoke with the Pt and Pt's dtr via phone and they are both agreeable to [facility] and CSW left a message for Janie accepting the bed offer.   D/C summary was sent to facility.   CSW awaiting a call back from facility for d/c planning to facility today.     CSW will continue to follow Pt for d/c planning.    [facility]  4N 1[address]16;  [phone] Phone: [phone]

## 2014-03-29 NOTE — Progress Notes (Signed)
Physical Therapy Treatment Patient Details Name: Deborah Whitehead MRN: [id number] DOB: [birth year] Today's Date: 03/29/2014    History of Present Illness 63 y.o. s/p Acute right cerebral hemispheric infarcts    PT Comments    Pt very motivated to amb today.  Pt continues to require A for transfers and balance during ambulation, but was able to increase activity today.  Will continue to follow.    Follow Up Recommendations  SNF     Equipment Recommendations  None recommended by PT    Recommendations for Other Services       Precautions / Restrictions Precautions Precautions: Fall Restrictions Weight Bearing Restrictions: No    Mobility  Bed Mobility                  Transfers Overall transfer level: Needs assistance Equipment used: Rolling walker (2 wheeled) Transfers: Sit to/from Stand Sit to Stand: Min assist;Mod assist         General transfer comment: cues for UE use and getting closer prior to sitting.  pt needed MinA for coming to stand from low bench without armrests.    Ambulation/Gait Ambulation/Gait assistance: Min assist Ambulation Distance (Feet): 60 Feet (x2) Assistive device: Rolling walker (2 wheeled) Gait Pattern/deviations: Step-through pattern;Decreased stride length;Decreased step length - left;Drifts right/left   Gait velocity interpretation: Below normal speed for age/gender General Gait Details: pt consistently needs MinA for balance and safety.  pt needs verbal cueing and increased time to process.  pt tends to drag L LE and drifts to L side.     Stairs            Wheelchair Mobility    Modified Rankin (Stroke Patients Only) Modified Rankin (Stroke Patients Only) Pre-Morbid Rankin Score: Moderately severe disability Modified Rankin: Moderately severe disability     Balance Overall balance assessment: Needs assistance         Standing balance support: Bilateral upper extremity supported Standing balance-Leahy  Scale: Poor Standing balance comment: pt able to stand at sink, but requires leaning against sink to maintain balance during hand hygiene.                      Cognition Arousal/Alertness: Awake/alert Behavior During Therapy: Anxious Overall Cognitive Status: No family/caregiver present to determine baseline cognitive functioning                      Exercises      General Comments        Pertinent Vitals/Pain Denied pain.      Home Living                      Prior Function            PT Goals (current goals can now be found in the care plan section) Acute Rehab PT Goals Patient Stated Goal: not stated Time For Goal Achievement: 04/07/14 Potential to Achieve Goals: Good Progress towards PT goals: Progressing toward goals    Frequency  Min 4X/week    PT Plan Current plan remains appropriate    Co-evaluation             End of Session Equipment Utilized During Treatment: Gait belt Activity Tolerance: Patient tolerated treatment well Patient left: in chair;with call bell/phone within reach;with chair alarm set     Time: 0822-0855 PT Time Calculation (min): 33 min  Charges:  $Gait Training: 23-37 mins  G Codes:      [place], [place] [phone] 03/29/2014, 9:05 AM

## 2014-04-05 ENCOUNTER — Ambulatory Visit: Payer: Medicare Other

## 2014-04-09 ENCOUNTER — Ambulatory Visit: Payer: Medicare Other | Admitting: *Deleted

## 2014-04-09 LAB — MDC_IDC_ENUM_SESS_TYPE_REMOTE
MDC IDC SESS DTM: 20150410185838
MDC IDC SET ZONE DETECTION INTERVAL: 360 ms
Zone Setting Detection Interval: 2000 ms
Zone Setting Detection Interval: 3000 ms

## 2014-04-14 ENCOUNTER — Ambulatory Visit: Payer: Medicare Other

## 2014-04-15 ENCOUNTER — Encounter: Payer: Self-pay | Admitting: Internal Medicine

## 2014-04-15 ENCOUNTER — Telehealth: Payer: Self-pay | Admitting: Internal Medicine

## 2014-04-21 ENCOUNTER — Ambulatory Visit (INDEPENDENT_AMBULATORY_CARE_PROVIDER_SITE_OTHER): Payer: Medicare Other | Admitting: *Deleted

## 2014-04-21 DIAGNOSIS — I6350 Cerebral infarction due to unspecified occlusion or stenosis of unspecified cerebral artery: Secondary | ICD-10-CM

## 2014-04-21 LAB — MDC_IDC_ENUM_SESS_TYPE_INCLINIC

## 2014-04-21 NOTE — Progress Notes (Signed)
Loop check in clinic.  Pt with 0 tachy episodes; 0 brady episodes; 0 asystole.  Wound well healed.  Followed by [facility].

## 2014-04-28 ENCOUNTER — Ambulatory Visit: Payer: Medicare Other

## 2014-05-28 ENCOUNTER — Encounter: Payer: Self-pay | Admitting: Internal Medicine

## 2014-06-04 ENCOUNTER — Telehealth: Payer: Self-pay | Admitting: Cardiology

## 2014-06-04 NOTE — Telephone Encounter (Signed)
Spoke with pt and informed her to send manual transmission. Pt verbalized understanding.

## 2014-06-07 ENCOUNTER — Telehealth: Payer: Self-pay | Admitting: Internal Medicine

## 2014-06-07 NOTE — Telephone Encounter (Signed)
New message     Pt has moved out of town and has a loop recorder. She will fax a release for records, but is there anything she needs to do because of the loop recorder

## 2014-06-07 NOTE — Telephone Encounter (Signed)
Routing to device clinic 

## 2014-06-08 NOTE — Telephone Encounter (Signed)
Spoke w/pt and pt having trouble with hooking monitor up by herself. Will call back around 12.

## 2014-06-14 ENCOUNTER — Telehealth: Payer: Self-pay | Admitting: Internal Medicine

## 2014-06-14 NOTE — Telephone Encounter (Signed)
NEW PROBLEM:     Pt moved to [place] and needs a referral to a Cardiologist there ASAP please.  [facility]  # [phone] fax # [phone] Attn: Dr Jeani Sow.   Give pt a call if any questions.

## 2014-06-15 ENCOUNTER — Other Ambulatory Visit: Payer: Self-pay | Admitting: *Deleted

## 2014-06-22 NOTE — Telephone Encounter (Signed)
Have attempted to reach this patient several times but voicemail has not been set up and will not let me leave a message.   (when/if patient calls back - explain, per Dr. Caryl Comes, that she does not need a referral and she can call them herself to establish care)

## 2014-07-09 ENCOUNTER — Telehealth: Payer: Self-pay | Admitting: Nurse Practitioner

## 2014-07-09 ENCOUNTER — Ambulatory Visit: Payer: Medicare Other | Admitting: Nurse Practitioner

## 2014-07-09 NOTE — Telephone Encounter (Signed)
Patient was no show for today's office appointment.  

## 2014-08-03 ENCOUNTER — Encounter: Payer: Self-pay | Admitting: Cardiology

## 2014-08-04 DIAGNOSIS — F411 Generalized anxiety disorder: Secondary | ICD-10-CM | POA: Insufficient documentation

## 2014-08-04 DIAGNOSIS — F331 Major depressive disorder, recurrent, moderate: Secondary | ICD-10-CM | POA: Insufficient documentation

## 2014-09-08 ENCOUNTER — Telehealth: Payer: Self-pay | Admitting: Cardiology

## 2014-09-08 ENCOUNTER — Encounter: Payer: Self-pay | Admitting: Cardiology

## 2014-09-08 NOTE — Telephone Encounter (Signed)
Attempted to call pt b/c of disconnected monitor for loop recorder. Home phone is disconnected and no answer on cell phone number and unable to leave a message. This is the 2nd attempt to contact pt to reactivate home monitor. I will also send a 2nd letter.

## 2014-10-04 ENCOUNTER — Encounter: Payer: Self-pay | Admitting: Internal Medicine

## 2014-11-05 ENCOUNTER — Encounter: Payer: Self-pay | Admitting: Cardiology
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
## 2014-11-23 ENCOUNTER — Encounter: Payer: Self-pay | Admitting: Cardiology

## 2014-11-23 ENCOUNTER — Telehealth: Payer: Self-pay | Admitting: Cardiology

## 2014-11-23 NOTE — Telephone Encounter (Signed)
Attempted to call pt and request that she send manual transmission b/c home monitor has been disconnected. No answer and unable to leave a message. Letter mailed.

## 2014-11-25 ENCOUNTER — Encounter (HOSPITAL_COMMUNITY): Payer: Self-pay | Admitting: Internal Medicine

## 2014-12-24 ENCOUNTER — Ambulatory Visit (INDEPENDENT_AMBULATORY_CARE_PROVIDER_SITE_OTHER): Payer: Medicare Other | Admitting: *Deleted

## 2014-12-24 DIAGNOSIS — I639 Cerebral infarction, unspecified: Secondary | ICD-10-CM

## 2014-12-24 LAB — MDC_IDC_ENUM_SESS_TYPE_REMOTE

## 2014-12-31 NOTE — Progress Notes (Signed)
Loop recorder 

## 2015-01-24 ENCOUNTER — Ambulatory Visit (INDEPENDENT_AMBULATORY_CARE_PROVIDER_SITE_OTHER): Payer: Medicare Other | Admitting: *Deleted

## 2015-01-24 DIAGNOSIS — I639 Cerebral infarction, unspecified: Secondary | ICD-10-CM | POA: Diagnosis not present

## 2015-01-24 NOTE — Progress Notes (Signed)
Loop recorder 

## 2015-02-06 LAB — MDC_IDC_ENUM_SESS_TYPE_REMOTE

## 2015-02-22 ENCOUNTER — Ambulatory Visit (INDEPENDENT_AMBULATORY_CARE_PROVIDER_SITE_OTHER): Payer: Medicare Other | Admitting: *Deleted

## 2015-02-22 DIAGNOSIS — I639 Cerebral infarction, unspecified: Secondary | ICD-10-CM

## 2015-02-24 NOTE — Progress Notes (Signed)
Loop recorder 

## 2015-03-02 ENCOUNTER — Encounter: Payer: Self-pay | Admitting: Internal Medicine

## 2015-03-09 LAB — MDC_IDC_ENUM_SESS_TYPE_REMOTE

## 2015-03-15 ENCOUNTER — Encounter: Payer: Self-pay | Admitting: Internal Medicine

## 2015-03-21 ENCOUNTER — Encounter: Payer: Self-pay | Admitting: Internal Medicine

## 2015-03-24 ENCOUNTER — Ambulatory Visit (INDEPENDENT_AMBULATORY_CARE_PROVIDER_SITE_OTHER): Payer: Medicare Other | Admitting: *Deleted

## 2015-03-24 DIAGNOSIS — I639 Cerebral infarction, unspecified: Secondary | ICD-10-CM | POA: Diagnosis not present

## 2015-03-28 NOTE — Progress Notes (Signed)
Loop recorder 

## 2015-04-07 ENCOUNTER — Encounter: Payer: Self-pay | Admitting: Cardiology

## 2015-04-15 ENCOUNTER — Encounter: Payer: Self-pay | Admitting: Cardiology

## 2015-04-22 ENCOUNTER — Ambulatory Visit (INDEPENDENT_AMBULATORY_CARE_PROVIDER_SITE_OTHER): Payer: Medicare Other | Admitting: *Deleted

## 2015-04-22 DIAGNOSIS — I639 Cerebral infarction, unspecified: Secondary | ICD-10-CM

## 2015-04-26 LAB — CUP PACEART REMOTE DEVICE CHECK: MDC IDC SESS DTM: 20160510130407

## 2015-04-29 NOTE — Progress Notes (Signed)
Loop recorder 

## 2015-05-12 LAB — CUP PACEART REMOTE DEVICE CHECK: MDC IDC SESS DTM: 20160526135136

## 2015-05-20 ENCOUNTER — Encounter: Payer: Self-pay | Admitting: Cardiology

## 2015-05-23 ENCOUNTER — Ambulatory Visit (INDEPENDENT_AMBULATORY_CARE_PROVIDER_SITE_OTHER): Payer: Medicare Other | Admitting: *Deleted

## 2015-05-23 DIAGNOSIS — I639 Cerebral infarction, unspecified: Secondary | ICD-10-CM

## 2015-05-25 ENCOUNTER — Encounter: Payer: Self-pay | Admitting: Internal Medicine

## 2015-05-25 NOTE — Progress Notes (Signed)
Loop recorder 

## 2015-06-01 ENCOUNTER — Encounter: Payer: Self-pay | Admitting: Cardiology

## 2015-06-02 LAB — CUP PACEART REMOTE DEVICE CHECK: Date Time Interrogation Session: 20160616125233

## 2015-06-08 ENCOUNTER — Encounter: Payer: Self-pay | Admitting: Cardiology

## 2015-06-14 ENCOUNTER — Encounter: Payer: Self-pay | Admitting: Cardiology

## 2015-06-23 ENCOUNTER — Encounter: Payer: Self-pay | Admitting: Cardiology

## 2015-07-05 ENCOUNTER — Encounter: Payer: Self-pay | Admitting: Internal Medicine

## 2015-07-11 ENCOUNTER — Encounter: Payer: Self-pay | Admitting: Internal Medicine

## 2016-10-01 DIAGNOSIS — E119 Type 2 diabetes mellitus without complications: Secondary | ICD-10-CM | POA: Insufficient documentation

## 2016-10-01 DIAGNOSIS — R7309 Other abnormal glucose: Secondary | ICD-10-CM | POA: Insufficient documentation

## 2016-10-01 DIAGNOSIS — G4700 Insomnia, unspecified: Secondary | ICD-10-CM | POA: Insufficient documentation

## 2016-10-02 DIAGNOSIS — R21 Rash and other nonspecific skin eruption: Secondary | ICD-10-CM | POA: Insufficient documentation

## 2016-12-18 DIAGNOSIS — Z8673 Personal history of transient ischemic attack (TIA), and cerebral infarction without residual deficits: Secondary | ICD-10-CM | POA: Insufficient documentation

## 2016-12-18 DIAGNOSIS — R531 Weakness: Secondary | ICD-10-CM | POA: Insufficient documentation

## 2020-06-14 DIAGNOSIS — I10 Essential (primary) hypertension: Secondary | ICD-10-CM | POA: Diagnosis not present

## 2020-07-05 DIAGNOSIS — R0609 Other forms of dyspnea: Secondary | ICD-10-CM | POA: Diagnosis not present

## 2020-07-05 DIAGNOSIS — I10 Essential (primary) hypertension: Secondary | ICD-10-CM | POA: Diagnosis not present

## 2020-07-26 DIAGNOSIS — I10 Essential (primary) hypertension: Secondary | ICD-10-CM | POA: Diagnosis not present

## 2020-08-18 ENCOUNTER — Encounter (HOSPITAL_COMMUNITY): Payer: Self-pay

## 2020-08-18 ENCOUNTER — Inpatient Hospital Stay (HOSPITAL_COMMUNITY)
Admission: EM | Admit: 2020-08-18 | Discharge: 2020-08-23 | DRG: 683 | Disposition: A | Discharge status: Skilled Nursing Facility | Payer: Medicare HMO | Attending: Internal Medicine | Admitting: Internal Medicine

## 2020-08-18 DIAGNOSIS — I69354 Hemiplegia and hemiparesis following cerebral infarction affecting left non-dominant side: Secondary | ICD-10-CM | POA: Diagnosis not present

## 2020-08-18 DIAGNOSIS — R29810 Facial weakness: Secondary | ICD-10-CM | POA: Diagnosis not present

## 2020-08-18 DIAGNOSIS — E169 Disorder of pancreatic internal secretion, unspecified: Secondary | ICD-10-CM | POA: Diagnosis not present

## 2020-08-18 DIAGNOSIS — E1165 Type 2 diabetes mellitus with hyperglycemia: Secondary | ICD-10-CM | POA: Diagnosis not present

## 2020-08-18 DIAGNOSIS — R011 Cardiac murmur, unspecified: Secondary | ICD-10-CM | POA: Diagnosis present

## 2020-08-18 DIAGNOSIS — Z83438 Family history of other disorder of lipoprotein metabolism and other lipidemia: Secondary | ICD-10-CM | POA: Diagnosis not present

## 2020-08-18 DIAGNOSIS — G250 Essential tremor: Secondary | ICD-10-CM | POA: Diagnosis not present

## 2020-08-18 DIAGNOSIS — E782 Mixed hyperlipidemia: Secondary | ICD-10-CM | POA: Diagnosis not present

## 2020-08-18 DIAGNOSIS — Z8349 Family history of other endocrine, nutritional and metabolic diseases: Secondary | ICD-10-CM

## 2020-08-18 DIAGNOSIS — Z7984 Long term (current) use of oral hypoglycemic drugs: Secondary | ICD-10-CM | POA: Diagnosis not present

## 2020-08-18 DIAGNOSIS — N179 Acute kidney failure, unspecified: Secondary | ICD-10-CM | POA: Diagnosis not present

## 2020-08-18 DIAGNOSIS — Z833 Family history of diabetes mellitus: Secondary | ICD-10-CM

## 2020-08-18 DIAGNOSIS — M2550 Pain in unspecified joint: Secondary | ICD-10-CM | POA: Diagnosis not present

## 2020-08-18 DIAGNOSIS — Z20822 Contact with and (suspected) exposure to covid-19: Secondary | ICD-10-CM | POA: Diagnosis not present

## 2020-08-18 DIAGNOSIS — R2681 Unsteadiness on feet: Secondary | ICD-10-CM | POA: Diagnosis not present

## 2020-08-18 DIAGNOSIS — Z8249 Family history of ischemic heart disease and other diseases of the circulatory system: Secondary | ICD-10-CM

## 2020-08-18 DIAGNOSIS — K219 Gastro-esophageal reflux disease without esophagitis: Secondary | ICD-10-CM | POA: Diagnosis present

## 2020-08-18 DIAGNOSIS — I700 Atherosclerosis of aorta: Secondary | ICD-10-CM | POA: Diagnosis not present

## 2020-08-18 DIAGNOSIS — G939 Disorder of brain, unspecified: Secondary | ICD-10-CM

## 2020-08-18 DIAGNOSIS — Z79899 Other long term (current) drug therapy: Secondary | ICD-10-CM

## 2020-08-18 DIAGNOSIS — Q278 Other specified congenital malformations of peripheral vascular system: Secondary | ICD-10-CM | POA: Diagnosis not present

## 2020-08-18 DIAGNOSIS — R000 Tachycardia, unspecified: Secondary | ICD-10-CM | POA: Diagnosis not present

## 2020-08-18 DIAGNOSIS — N1831 Chronic kidney disease, stage 3a: Secondary | ICD-10-CM | POA: Diagnosis not present

## 2020-08-18 DIAGNOSIS — Z7401 Bed confinement status: Secondary | ICD-10-CM | POA: Diagnosis not present

## 2020-08-18 DIAGNOSIS — M6259 Muscle wasting and atrophy, not elsewhere classified, multiple sites: Secondary | ICD-10-CM | POA: Diagnosis not present

## 2020-08-18 DIAGNOSIS — Z6836 Body mass index (BMI) 36.0-36.9, adult: Secondary | ICD-10-CM

## 2020-08-18 DIAGNOSIS — R911 Solitary pulmonary nodule: Secondary | ICD-10-CM | POA: Diagnosis present

## 2020-08-18 DIAGNOSIS — E785 Hyperlipidemia, unspecified: Secondary | ICD-10-CM | POA: Diagnosis not present

## 2020-08-18 DIAGNOSIS — Z825 Family history of asthma and other chronic lower respiratory diseases: Secondary | ICD-10-CM | POA: Diagnosis not present

## 2020-08-18 DIAGNOSIS — G9389 Other specified disorders of brain: Secondary | ICD-10-CM | POA: Diagnosis not present

## 2020-08-18 DIAGNOSIS — N390 Urinary tract infection, site not specified: Secondary | ICD-10-CM | POA: Diagnosis not present

## 2020-08-18 DIAGNOSIS — I951 Orthostatic hypotension: Secondary | ICD-10-CM | POA: Diagnosis not present

## 2020-08-18 DIAGNOSIS — R5381 Other malaise: Secondary | ICD-10-CM | POA: Diagnosis not present

## 2020-08-18 DIAGNOSIS — R55 Syncope and collapse: Secondary | ICD-10-CM | POA: Diagnosis not present

## 2020-08-18 DIAGNOSIS — G372 Central pontine myelinolysis: Secondary | ICD-10-CM | POA: Diagnosis not present

## 2020-08-18 DIAGNOSIS — R2689 Other abnormalities of gait and mobility: Secondary | ICD-10-CM

## 2020-08-18 DIAGNOSIS — R457 State of emotional shock and stress, unspecified: Secondary | ICD-10-CM | POA: Diagnosis not present

## 2020-08-18 DIAGNOSIS — E669 Obesity, unspecified: Secondary | ICD-10-CM | POA: Diagnosis present

## 2020-08-18 DIAGNOSIS — Z87891 Personal history of nicotine dependence: Secondary | ICD-10-CM | POA: Diagnosis not present

## 2020-08-18 DIAGNOSIS — E7800 Pure hypercholesterolemia, unspecified: Secondary | ICD-10-CM | POA: Diagnosis not present

## 2020-08-18 DIAGNOSIS — M21372 Foot drop, left foot: Secondary | ICD-10-CM | POA: Diagnosis present

## 2020-08-18 DIAGNOSIS — E1159 Type 2 diabetes mellitus with other circulatory complications: Secondary | ICD-10-CM | POA: Diagnosis not present

## 2020-08-18 DIAGNOSIS — F29 Unspecified psychosis not due to a substance or known physiological condition: Secondary | ICD-10-CM | POA: Diagnosis not present

## 2020-08-18 DIAGNOSIS — G459 Transient cerebral ischemic attack, unspecified: Secondary | ICD-10-CM | POA: Diagnosis not present

## 2020-08-18 DIAGNOSIS — Z8673 Personal history of transient ischemic attack (TIA), and cerebral infarction without residual deficits: Secondary | ICD-10-CM

## 2020-08-18 DIAGNOSIS — I63531 Cerebral infarction due to unspecified occlusion or stenosis of right posterior cerebral artery: Secondary | ICD-10-CM | POA: Diagnosis not present

## 2020-08-18 DIAGNOSIS — E878 Other disorders of electrolyte and fluid balance, not elsewhere classified: Secondary | ICD-10-CM | POA: Diagnosis not present

## 2020-08-18 DIAGNOSIS — R531 Weakness: Secondary | ICD-10-CM

## 2020-08-18 DIAGNOSIS — I6523 Occlusion and stenosis of bilateral carotid arteries: Secondary | ICD-10-CM | POA: Diagnosis not present

## 2020-08-18 DIAGNOSIS — F419 Anxiety disorder, unspecified: Secondary | ICD-10-CM | POA: Diagnosis not present

## 2020-08-18 DIAGNOSIS — I10 Essential (primary) hypertension: Secondary | ICD-10-CM | POA: Diagnosis not present

## 2020-08-18 DIAGNOSIS — I6389 Other cerebral infarction: Secondary | ICD-10-CM | POA: Diagnosis not present

## 2020-08-18 DIAGNOSIS — I499 Cardiac arrhythmia, unspecified: Secondary | ICD-10-CM | POA: Diagnosis not present

## 2020-08-18 DIAGNOSIS — K449 Diaphragmatic hernia without obstruction or gangrene: Secondary | ICD-10-CM | POA: Diagnosis not present

## 2020-08-18 DIAGNOSIS — R0602 Shortness of breath: Secondary | ICD-10-CM | POA: Diagnosis not present

## 2020-08-18 DIAGNOSIS — E11649 Type 2 diabetes mellitus with hypoglycemia without coma: Secondary | ICD-10-CM | POA: Diagnosis not present

## 2020-08-18 DIAGNOSIS — R42 Dizziness and giddiness: Secondary | ICD-10-CM | POA: Diagnosis not present

## 2020-08-18 DIAGNOSIS — M6281 Muscle weakness (generalized): Secondary | ICD-10-CM | POA: Diagnosis not present

## 2020-08-18 DIAGNOSIS — J984 Other disorders of lung: Secondary | ICD-10-CM | POA: Diagnosis not present

## 2020-08-18 DIAGNOSIS — I639 Cerebral infarction, unspecified: Secondary | ICD-10-CM | POA: Diagnosis not present

## 2020-08-18 LAB — CBC
HCT: 42.6 % (ref 36.0–46.0)
Hemoglobin: 13.1 g/dL (ref 12.0–15.0)
MCH: 29.2 pg (ref 26.0–34.0)
MCHC: 30.8 g/dL (ref 30.0–36.0)
MCV: 94.9 fL (ref 80.0–100.0)
Platelets: 257 10*3/uL (ref 150–400)
RBC: 4.49 MIL/uL (ref 3.87–5.11)
RDW: 11.9 % (ref 11.5–15.5)
WBC: 7.4 10*3/uL (ref 4.0–10.5)
nRBC: 0 % (ref 0.0–0.2)

## 2020-08-18 NOTE — ED Triage Notes (Signed)
Pt comes via [facility] for dizziness and weakness that happened while transferring, pt became diaphoretic and had near syncopal episode. Hx of vertigo and stroke with L sided deficits

## 2020-08-19 ENCOUNTER — Emergency Department (HOSPITAL_COMMUNITY): Payer: Medicare HMO

## 2020-08-19 ENCOUNTER — Inpatient Hospital Stay (HOSPITAL_COMMUNITY): Payer: Medicare HMO

## 2020-08-19 ENCOUNTER — Other Ambulatory Visit: Payer: Self-pay

## 2020-08-19 DIAGNOSIS — E7800 Pure hypercholesterolemia, unspecified: Secondary | ICD-10-CM | POA: Diagnosis not present

## 2020-08-19 DIAGNOSIS — G939 Disorder of brain, unspecified: Secondary | ICD-10-CM | POA: Diagnosis not present

## 2020-08-19 DIAGNOSIS — Z20822 Contact with and (suspected) exposure to covid-19: Secondary | ICD-10-CM | POA: Diagnosis present

## 2020-08-19 DIAGNOSIS — I69354 Hemiplegia and hemiparesis following cerebral infarction affecting left non-dominant side: Secondary | ICD-10-CM | POA: Diagnosis not present

## 2020-08-19 DIAGNOSIS — R011 Cardiac murmur, unspecified: Secondary | ICD-10-CM | POA: Diagnosis present

## 2020-08-19 DIAGNOSIS — Z8249 Family history of ischemic heart disease and other diseases of the circulatory system: Secondary | ICD-10-CM | POA: Diagnosis not present

## 2020-08-19 DIAGNOSIS — F419 Anxiety disorder, unspecified: Secondary | ICD-10-CM | POA: Diagnosis present

## 2020-08-19 DIAGNOSIS — Z87891 Personal history of nicotine dependence: Secondary | ICD-10-CM | POA: Diagnosis not present

## 2020-08-19 DIAGNOSIS — K219 Gastro-esophageal reflux disease without esophagitis: Secondary | ICD-10-CM | POA: Diagnosis present

## 2020-08-19 DIAGNOSIS — I951 Orthostatic hypotension: Secondary | ICD-10-CM | POA: Diagnosis present

## 2020-08-19 DIAGNOSIS — G250 Essential tremor: Secondary | ICD-10-CM | POA: Diagnosis present

## 2020-08-19 DIAGNOSIS — N179 Acute kidney failure, unspecified: Secondary | ICD-10-CM | POA: Diagnosis present

## 2020-08-19 DIAGNOSIS — Z7984 Long term (current) use of oral hypoglycemic drugs: Secondary | ICD-10-CM | POA: Diagnosis not present

## 2020-08-19 DIAGNOSIS — Z6836 Body mass index (BMI) 36.0-36.9, adult: Secondary | ICD-10-CM | POA: Diagnosis not present

## 2020-08-19 DIAGNOSIS — E782 Mixed hyperlipidemia: Secondary | ICD-10-CM | POA: Diagnosis not present

## 2020-08-19 DIAGNOSIS — Z83438 Family history of other disorder of lipoprotein metabolism and other lipidemia: Secondary | ICD-10-CM | POA: Diagnosis not present

## 2020-08-19 DIAGNOSIS — E1165 Type 2 diabetes mellitus with hyperglycemia: Secondary | ICD-10-CM | POA: Diagnosis present

## 2020-08-19 DIAGNOSIS — E785 Hyperlipidemia, unspecified: Secondary | ICD-10-CM | POA: Diagnosis present

## 2020-08-19 DIAGNOSIS — G459 Transient cerebral ischemic attack, unspecified: Secondary | ICD-10-CM | POA: Diagnosis not present

## 2020-08-19 DIAGNOSIS — E1159 Type 2 diabetes mellitus with other circulatory complications: Secondary | ICD-10-CM | POA: Diagnosis not present

## 2020-08-19 DIAGNOSIS — R531 Weakness: Secondary | ICD-10-CM

## 2020-08-19 DIAGNOSIS — Z833 Family history of diabetes mellitus: Secondary | ICD-10-CM | POA: Diagnosis not present

## 2020-08-19 DIAGNOSIS — Z79899 Other long term (current) drug therapy: Secondary | ICD-10-CM | POA: Diagnosis not present

## 2020-08-19 DIAGNOSIS — M21372 Foot drop, left foot: Secondary | ICD-10-CM | POA: Diagnosis present

## 2020-08-19 DIAGNOSIS — I6389 Other cerebral infarction: Secondary | ICD-10-CM | POA: Diagnosis not present

## 2020-08-19 DIAGNOSIS — Z8673 Personal history of transient ischemic attack (TIA), and cerebral infarction without residual deficits: Secondary | ICD-10-CM | POA: Diagnosis not present

## 2020-08-19 DIAGNOSIS — R911 Solitary pulmonary nodule: Secondary | ICD-10-CM | POA: Diagnosis present

## 2020-08-19 DIAGNOSIS — E878 Other disorders of electrolyte and fluid balance, not elsewhere classified: Secondary | ICD-10-CM | POA: Diagnosis not present

## 2020-08-19 DIAGNOSIS — Z8349 Family history of other endocrine, nutritional and metabolic diseases: Secondary | ICD-10-CM | POA: Diagnosis not present

## 2020-08-19 DIAGNOSIS — Z825 Family history of asthma and other chronic lower respiratory diseases: Secondary | ICD-10-CM | POA: Diagnosis not present

## 2020-08-19 DIAGNOSIS — E669 Obesity, unspecified: Secondary | ICD-10-CM | POA: Diagnosis present

## 2020-08-19 DIAGNOSIS — N390 Urinary tract infection, site not specified: Secondary | ICD-10-CM | POA: Diagnosis present

## 2020-08-19 DIAGNOSIS — I10 Essential (primary) hypertension: Secondary | ICD-10-CM | POA: Diagnosis present

## 2020-08-19 LAB — BASIC METABOLIC PANEL
Anion gap: 12 (ref 5–15)
BUN: 20 mg/dL (ref 8–23)
CO2: 27 mmol/L (ref 22–32)
Calcium: 9.2 mg/dL (ref 8.9–10.3)
Chloride: 100 mmol/L (ref 98–111)
Creatinine, Ser: 1.61 mg/dL — ABNORMAL HIGH (ref 0.44–1.00)
GFR calc Af Amer: 38 mL/min — ABNORMAL LOW (ref >60)
GFR calc non Af Amer: 33 mL/min — ABNORMAL LOW (ref >60)
Glucose, Bld: 188 mg/dL — ABNORMAL HIGH (ref 70–99)
Potassium: 3.6 mmol/L (ref 3.5–5.1)
Sodium: 139 mmol/L (ref 135–145)

## 2020-08-19 LAB — TROPONIN I (HIGH SENSITIVITY): Troponin I (High Sensitivity): 24 ng/L — ABNORMAL HIGH (ref <18)

## 2020-08-19 LAB — URINALYSIS, ROUTINE W REFLEX MICROSCOPIC
Bilirubin Urine: NEGATIVE
Glucose, UA: NEGATIVE mg/dL
Hgb urine dipstick: NEGATIVE
Ketones, ur: NEGATIVE mg/dL
Nitrite: NEGATIVE
Protein, ur: 100 mg/dL — AB
Specific Gravity, Urine: 1.023 (ref 1.005–1.030)
pH: 5 (ref 5.0–8.0)

## 2020-08-19 LAB — CBG MONITORING, ED: Glucose-Capillary: 185 mg/dL — ABNORMAL HIGH (ref 70–99)

## 2020-08-19 LAB — HEMOGLOBIN A1C
Hgb A1c MFr Bld: 7.8 % — ABNORMAL HIGH (ref 4.8–5.6)
Mean Plasma Glucose: 177.16 mg/dL

## 2020-08-19 LAB — GLUCOSE, CAPILLARY: Glucose-Capillary: 170 mg/dL — ABNORMAL HIGH (ref 70–99)

## 2020-08-19 LAB — SARS CORONAVIRUS 2 BY RT PCR (HOSPITAL ORDER, PERFORMED IN ~~LOC~~ HOSPITAL LAB): SARS Coronavirus 2: NEGATIVE

## 2020-08-19 LAB — BRAIN NATRIURETIC PEPTIDE: B Natriuretic Peptide: 29.6 pg/mL (ref 0.0–100.0)

## 2020-08-19 LAB — D-DIMER, QUANTITATIVE: D-Dimer, Quant: 2.05 ug/mL-FEU — ABNORMAL HIGH (ref 0.00–0.50)

## 2020-08-19 MED ORDER — ACETAMINOPHEN 325 MG PO TABS
650.0000 mg | ORAL_TABLET | Freq: Four times a day (QID) | ORAL | Status: DC | PRN
  Administered 2020-08-21: 650 mg via ORAL
  Filled 2020-08-19: qty 2

## 2020-08-19 MED ORDER — IOHEXOL 350 MG/ML SOLN
75.0000 mL | Freq: Once | INTRAVENOUS | Status: AC | PRN
  Administered 2020-08-19: 75 mL via INTRAVENOUS

## 2020-08-19 MED ORDER — AMLODIPINE BESYLATE 5 MG PO TABS
5.0000 mg | ORAL_TABLET | Freq: Every day | ORAL | Status: DC
  Administered 2020-08-19: 5 mg via ORAL
  Filled 2020-08-19: qty 1

## 2020-08-19 MED ORDER — LOSARTAN POTASSIUM 50 MG PO TABS
100.0000 mg | ORAL_TABLET | Freq: Every day | ORAL | Status: DC
  Administered 2020-08-19: 100 mg via ORAL
  Filled 2020-08-19: qty 2

## 2020-08-19 MED ORDER — LORAZEPAM 2 MG/ML IJ SOLN
1.0000 mg | Freq: Once | INTRAMUSCULAR | Status: AC
  Administered 2020-08-19: 1 mg via INTRAVENOUS
  Filled 2020-08-19: qty 1

## 2020-08-19 MED ORDER — FUROSEMIDE 20 MG PO TABS
20.0000 mg | ORAL_TABLET | Freq: Every day | ORAL | Status: DC
  Administered 2020-08-19 – 2020-08-23 (×5): 20 mg via ORAL
  Filled 2020-08-19 (×5): qty 1

## 2020-08-19 MED ORDER — INSULIN ASPART 100 UNIT/ML ~~LOC~~ SOLN
0.0000 [IU] | Freq: Three times a day (TID) | SUBCUTANEOUS | Status: DC
  Administered 2020-08-19: 3 [IU] via SUBCUTANEOUS
  Administered 2020-08-20: 2 [IU] via SUBCUTANEOUS
  Administered 2020-08-20 – 2020-08-22 (×4): 3 [IU] via SUBCUTANEOUS
  Administered 2020-08-22 – 2020-08-23 (×3): 2 [IU] via SUBCUTANEOUS
  Administered 2020-08-23: 3 [IU] via SUBCUTANEOUS

## 2020-08-19 MED ORDER — HEPARIN SODIUM (PORCINE) 5000 UNIT/ML IJ SOLN
5000.0000 [IU] | Freq: Three times a day (TID) | INTRAMUSCULAR | Status: DC
  Administered 2020-08-19 – 2020-08-23 (×13): 5000 [IU] via SUBCUTANEOUS
  Filled 2020-08-19 (×11): qty 1

## 2020-08-19 MED ORDER — LORAZEPAM 1 MG PO TABS: 1.0000 mg | ORAL_TABLET | Freq: Once | ORAL | Status: DC

## 2020-08-19 MED ORDER — PANTOPRAZOLE SODIUM 40 MG PO TBEC
80.0000 mg | DELAYED_RELEASE_TABLET | Freq: Every day | ORAL | Status: DC
  Administered 2020-08-19 – 2020-08-23 (×5): 80 mg via ORAL
  Filled 2020-08-19 (×5): qty 2

## 2020-08-19 MED ORDER — ACETAMINOPHEN 650 MG RE SUPP: 650.0000 mg | Freq: Four times a day (QID) | RECTAL | Status: DC | PRN

## 2020-08-19 NOTE — H&P (Addendum)
Date: 08/19/2020               Patient Name:  Deborah Whitehead MRN: [id number]  DOB: August 11, 1951 Age / Sex: 69 y.o., female   PCP: Lin Landsman, MD         Medical Service: Internal Medicine Teaching Service         Attending Physician: Dr. Lucious Groves, DO    First Contact: Dr. Konrad Penta Pager: [phone]  Second Contact: Dr. Myrtie Hawk Pager: [phone]       After Hours (After 5p/  First Contact Pager: [phone]  weekends / holidays): Second Contact Pager: [phone]   Chief Complaint: Weakness  History of Present Illness: This is a 69 year old female with a history DM, hypertension, GERD, prior CVA with residual left-sided weakness presenting with a 4 to 5-day history of worsening left lower extremity weakness and difficulty walking.  Obtained history from patient and daughter.  Patient reports that since Monday she has been having more difficulty walking, having left-sided weakness, left facial numbness, and reports a fall about a week ago where she hit the back of her head. Reports that on Monday she was walking to the bathroom and her left leg felt weaker she also endorses that this morning she had an episode of chills, palpitations, and sweating.  She has been having a frontal headache, is pounding sensation, nothing seems to worsen it, improves with rest, Tylenol helps.  Also endorses some worsening shortness of breath over the past week.  Denies any cough, nausea, vomiting, fevers, chills, syncope, lightheadedness, chest pain, or abdominal pain.  Daughter reports that last night she was getting her ready, wasn't walking with her walker as normal, more bent over and couldn't stand up. Felt like she was going to fall forward. She was drenched in sweats. Moved walker out of way and tried to have her stand up but still wasn't able to.  She became very sweaty at that time.  She didn't fall. Called her neighbor and they called EMS. Over the week, she has an aid coming in and nothing abnormal  was noted. Does not know if she lost consciousness but she doesn't know. Seemed a little confused about 3-4 weeks ago but nothing recently.   In the ED she was noted to be afebrile, normotensive, RR and HR normal. U/A showed large leukocytes, rare bacteria. D dimer 2.05. COVID negative. BMP showed Cr 1.6, up from BL around 1. Glucose 188. CBC unremarkable. CXR showed no acute findings. CT head scan showed a small 0.3 cm hyperdense focus in the central pons, could represent an acute hemorrhage, chronic right frontoparietal encephalomalacia, chronic small vessel ischemic changes. Neurology was consulted, concern for acute hemorrhage.  Recommended MRI/MRA head without contrast, platelets greater than 100, blood pressure under 140, and INR less than 1.4.  No antiplatelets at this time.  Patient admitted to [facility].  Meds:  Current Meds  Medication Sig  . amLODipine (NORVASC) 5 MG tablet Take 5 mg by mouth daily.  . clotrimazole-betamethasone (LOTRISONE) cream Apply 1 application topically daily as needed.  . furosemide (LASIX) 20 MG tablet Take 20 mg by mouth daily.  Marland Kitchen ibuprofen (ADVIL,MOTRIN) 200 MG tablet Take 200 mg by mouth every 6 (six) hours as needed for moderate pain.  Marland Kitchen losartan (COZAAR) 100 MG tablet Take 100 mg by mouth daily.  . metFORMIN (GLUCOPHAGE-XR) 500 MG 24 hr tablet Take 500 mg by mouth daily.  . Multiple Vitamin (MULTIVITAMIN) capsule Take 1  capsule by mouth daily.  Marland Kitchen omeprazole (PRILOSEC) 40 MG capsule Take 40 mg by mouth daily.     Allergies: Allergies as of 08/18/2020  . (No Known Allergies)   Past Medical History:  Diagnosis Date  . Anxiety   . Arthritis   . Depression   . Fibroids    abd  . GERD (gastroesophageal reflux disease)   . Hypertension    dr Ayesha Rumpf     immanuel fp  . Shortness of breath   . Stroke [facility])    memory loss  . Substance abuse ([place])     Family History:  Family History  Problem Relation Age of Onset  . Cancer Mother        Breast  .  Hypertension Mother   . Diabetes Mother   . Hyperlipidemia Mother   . Thyroid disease Mother   . Cancer Father        Prostate  . Asthma Brother    Social History: Quit smoking about 5 months ago, had a 20-pack-year history prior to that.  I denies any alcohol use or recreational drug use.  Currently living with her daughter, son-in-law, and granddaughter.  Review of Systems: A complete ROS was negative except as per HPI.   Physical Exam: Blood pressure 131/75, pulse 79, temperature 98.5 F (36.9 C), temperature source Oral, resp. rate 18, SpO2 94 %. Physical Exam Constitutional:      Appearance: Normal appearance.  HENT:     Head: Normocephalic and atraumatic.     Nose: Nose normal.     Mouth/Throat:     Mouth: Mucous membranes are moist.     Pharynx: Oropharynx is clear.  Eyes:     Extraocular Movements: Extraocular movements intact.     Conjunctiva/sclera: Conjunctivae normal.     Pupils: Pupils are equal, round, and reactive to light.  Cardiovascular:     Rate and Rhythm: Normal rate and regular rhythm.     Pulses: Normal pulses.     Heart sounds: Murmur (Systolic murmur, loudest at right 2nd intercostal space) heard.   Pulmonary:     Effort: Pulmonary effort is normal.     Breath sounds: Normal breath sounds.  Abdominal:     General: Abdomen is flat. Bowel sounds are normal.     Palpations: Abdomen is soft.  Musculoskeletal:        General: Normal range of motion.     Cervical back: Normal range of motion and neck supple.  Skin:    General: Skin is warm and dry.     Capillary Refill: Capillary refill takes less than 2 seconds.  Neurological:     Mental Status: She is alert. Mental status is at baseline.     Cranial Nerves: Cranial nerves are intact.     Sensory: Sensation is intact.     Motor: Weakness (LUE and LLE 4/5, RUE and RLE 5/5.) present.  Psychiatric:        Behavior: Behavior normal.     Comments: Anxious appearing    EKG: personally reviewed my  interpretation is sinus rhythm, diffuse artifact, no acute ST changes  CXR: personally reviewed my interpretation is trachea midline, no cardiomegaly, no acute cardiopulmonary disease  Assessment & Plan by Problem: Active Problems:   Weakness  This is a 69 year old female with a history of DM, hypertension, GERD, prior CVA with residual right left-sided weakness presenting with a 4-5-day history of worsening left lower extremity weakness and trouble walking.  Currently not on any  anticoagulation at home.  CT findings concerning for small acute hemorrhage in the pons.  Left upper and lower extremity weakness: Patient has a history of prior CVA with residual left-sided weakness presenting with worsening symptoms over the past 4-5 days.  There is initial concern about a possible syncopal episode however patient and daughter denies any specific syncopal symptoms, does not think she lost consciousness.  On exam she is noted to have 4/5 weakness in the left upper and left lower extremity, sensation to light touch intact.  CT head showed a small 0.3 cm hyperdense focus in the central pons that was not previously seen.  Neurology was consulted and recommended MRI/MRA head without contrast, platelets>100, INR<1.4, and blood JIRCVELF<810 systolic, no antiplatelets.  -Follow-up MRI/MRA -Neurology following, appreciate recommendations -BP medications as below -Cardiac telemetry -Monitor CBC and BMP -N.p.o. pending SLP -PT/OT  Shortness of breath: Presyncope: Patient had an episode of sweating, shortness of breath, palpitations, and chills that she states has been going on for about a week now.  No symptoms at this time.  She is currently on room air and lungs are CTA BL.  Chest x-ray is unremarkable.  They obtained a D-dimer in the ED which was elevated at 2.05.  She recently received an MRI with contrast of the brain, given the AKI they held off on a CT at this time.  Patient is currently hemodynamically  stable.  Consider obtaining a CTA if symptoms return.  -Monitor for now  Hypertension: Patient is on amlodipine 5 mg daily, Lasix 20 mg daily, and losartan 100 mg daily at home.  Blood pressure well controlled at 131/75 at this time.  Will resume home medications to ensure systolic FB<510 as above.  -Resume amlodipine 5 mg daily -Resume Lasix 20 mg daily -Resume losartan 100 mg daily  AKI: Creatinine 1.6, baseline around 1.  Potentially prerenal.  Patient appears mildly dry on exam.  Will give a fluid bolus and repeat in a.m. -BMP in a.m.  Diabetes mellitus: Currently on Metformin at home, patient reports well-controlled however does not know last A1c..  Glucose on admission is elevated at 1.88.  Last documented A1c 6 years ago at 6.3.  -Sliding scale insulin -Frequent CBGs -A1c  GERD: Patient on omeprazole 40 mg daily at home.  Will continue PPI that is on formulary while admitted.  Dispo: Admit patient to Inpatient with expected length of stay greater than 2 midnights.  Signed: Asencion Noble, MD 08/19/2020, 11:36 AM  Pager: [phone] After 5pm on weekdays and 1pm on weekends: On Call pager: [phone]

## 2020-08-19 NOTE — Consult Note (Signed)
Neurology Consultation  Reason for Consult: Stroke Referring Physician: Breck Coons, MD   CC: Left leg weakness  History is obtained from: Patient  HPI: Deborah Whitehead is a 69 y.o. female with history of substance abuse, stroke, SOB, hypertension.  Patient states that approximately around 5 PM on Monday she noted that when she had made her way to the bathroom her left leg felt weaker.  Patient walks with a walker and has a home health nurse.  After she had gone to the bathroom she tried to stand up and noted that she was unable to bear weight on her left leg and needed assistance from the nurse.  Patient did not want to come to the hospital as she does not like hospitals thus refused to come to the ED.  Her daughter to which she had just moved in with, finally told patient on Thursday that she was to come in as the symptoms did not resolve.  Her main complaint is that she was very weak and still is very weak in the left leg.  She states no other neurological symptoms at this time.  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  LKW: 5 PM on 08/15/2020 tpa given?: no, out of window in addition CT showed bleed Premorbid modified Rankin scale (mRS): 4 NIHSS 1a Level of Conscious.: 0 1b LOC Questions: 0 1c LOC Commands: 0 2 Best Gaze: 0 3 Visual: 0 4 Facial Palsy: 1 5a Motor Arm - left: 0 5b Motor Arm - Right: 0 6a Motor Leg - Left: 1 6b Motor Leg - Right: 1 7 Limb Ataxia: 0 8 Sensory: 0 9 Best Language: 0 10 Dysarthria: 0 11 Extinct. and Inatten.: 0 TOTAL: 3   Past Medical History:  Diagnosis Date  . Anxiety   . Arthritis   . Depression   . Fibroids    abd  . GERD (gastroesophageal reflux disease)   . Hypertension    dr Ayesha Rumpf     immanuel fp  . Shortness of breath   . Stroke [facility])    memory loss  . Substance abuse ([place])     Family History  Problem Relation Age of Onset  . Cancer Mother        Breast  . Hypertension Mother    . Diabetes Mother   . Hyperlipidemia Mother   . Thyroid disease Mother   . Cancer Father        Prostate  . Asthma Brother    Social History:   reports that she quit smoking about 7 years ago. She has a 3.75 pack-year smoking history. She has never used smokeless tobacco. She reports current drug use. Drug: Cocaine. She reports that she does not drink alcohol.  Medications  Current Facility-Administered Medications:  .  LORazepam (ATIVAN) injection 1 mg, 1 mg, Intravenous, Once, Breck Coons, MD  Current Outpatient Medications:  .  amLODipine (NORVASC) 5 MG tablet, Take 5 mg by mouth daily., Disp: , Rfl:  .  clotrimazole-betamethasone (LOTRISONE) cream, Apply 1 application topically daily as needed., Disp: , Rfl:  .  furosemide (LASIX) 20 MG tablet, Take 20 mg by mouth daily., Disp: , Rfl:  .  ibuprofen (ADVIL,MOTRIN) 200 MG tablet, Take 200 mg by mouth every 6 (six) hours as needed for moderate pain., Disp: , Rfl:  .  losartan (COZAAR) 100 MG tablet, Take 100 mg by mouth daily., Disp: , Rfl:  .  metFORMIN (GLUCOPHAGE-XR) 500 MG 24 hr tablet, Take 500 mg by mouth daily., Disp: , Rfl:  .  Multiple Vitamin (MULTIVITAMIN) capsule, Take 1 capsule by mouth daily., Disp: , Rfl:  .  omeprazole (PRILOSEC) 40 MG capsule, Take 40 mg by mouth daily., Disp: , Rfl:  .  ALPRAZolam (XANAX) 0.25 MG tablet, Take 1 tablet (0.25 mg total) by mouth 3 (three) times daily as needed for sleep or anxiety. (Patient not taking: Reported on 08/19/2020), Disp: 30 tablet, Rfl: 0 .  atorvastatin (LIPITOR) 40 MG tablet, Take 1 tablet (40 mg total) by mouth daily at 6 PM. (Patient not taking: Reported on 08/19/2020), Disp: 30 tablet, Rfl: 0 .  clopidogrel (PLAVIX) 75 MG tablet, Take 1 tablet (75 mg total) by mouth daily with breakfast. (Patient not taking: Reported on 08/19/2020), Disp: 30 tablet, Rfl: 1  ROS:     General ROS: negative for - chills, fatigue, fever, night sweats, weight gain or weight loss Psychological  ROS: negative for - behavioral disorder, hallucinations, memory difficulties, mood swings or suicidal ideation Ophthalmic ROS: negative for - blurry vision, double vision, eye pain or loss of vision ENT ROS: negative for - epistaxis, nasal discharge, oral lesions, sore throat, tinnitus or vertigo Allergy and Immunology ROS: negative for - hives or itchy/watery eyes Hematological and Lymphatic ROS: negative for - bleeding problems, bruising or swollen lymph nodes Endocrine ROS: negative for - galactorrhea, hair pattern changes, polydipsia/polyuria or temperature intolerance Respiratory ROS: negative for - cough, hemoptysis, shortness of breath or wheezing Cardiovascular ROS: negative for - chest pain, dyspnea on exertion, edema or irregular heartbeat Gastrointestinal ROS: negative for - abdominal pain, diarrhea, hematemesis, nausea/vomiting or stool incontinence Genito-Urinary ROS: negative for - dysuria, hematuria, incontinence or urinary frequency/urgency Musculoskeletal ROS: Positive for -  muscular weakness Neurological ROS: as noted in HPI Dermatological ROS: negative for rash and skin lesion changes  Exam: Current vital signs: BP 131/75   Pulse 79   Temp 98.5 F (36.9 C) (Oral)   Resp 18   SpO2 94%  Vital signs in last 24 hours: Temp:  [98.5 F (36.9 C)-98.7 F (37.1 C)] 98.5 F (36.9 C) (09/03 0409) Pulse Rate:  [76-99] 79 (09/03 0730) Resp:  [16-18] 18 (09/03 0730) BP: (123-145)/(71-127) 131/75 (09/03 0730) SpO2:  [94 %-100 %] 94 % (09/03 0730)   Constitutional: Appears well-developed and well-nourished.  Psych: Anxious Eyes: No scleral injection HENT: No OP obstrucion Head: Normocephalic.  Cardiovascular: palpable  Respiratory: Effort normal, non-labored breathing GI: Soft.  No distension. There is no tenderness.  Skin: WDI  Neuro: Mental Status: Patient is awake, alert, oriented to person, place, month, year, and situation. Speech-with no aphasia or dysarthria.   Naming, repeating, comprehension are grossly intact.  Patient is able to give a good history.  Patient is able to follow commands without difficulty.   Cranial Nerves: II: Visual Fields are full.  III,IV, VI: EOMI without ptosis or diploplia. Pupils equal, round and reactive to light V: Facial sensation is symmetric to temperature VII: Left facial droop VIII: hearing is intact to voice X: Palat elevates symmetrically XI: Shoulder shrug is symmetric. XII: tongue is midline without atrophy or fasciculations.  Motor: Right upper and lower extremity 5/5.  Left bicep 4/5, left tricep 4/5 left straight leg rise 4/5.  Patient has a left foot drop. Drift positive on the left Essential tremor bilateral upper extremities Sensory: Sensation is symmetric to light touch and temperature in the arms and legs. DSS Deep Tendon Reflexes:  2+ and symmetric in the biceps and patellae. Plantars: Toes are downgoing bilaterally.  Cerebellar: FNF and HKS are intact bilaterally  Labs I have reviewed labs in epic and the results pertinent to this consultation are:   CBC    Component Value Date/Time   WBC 7.4 08/18/2020 2244   RBC 4.49 08/18/2020 2244   HGB 13.1 08/18/2020 2244   HCT 42.6 08/18/2020 2244   PLT 257 08/18/2020 2244   MCV 94.9 08/18/2020 2244   MCH 29.2 08/18/2020 2244   MCHC 30.8 08/18/2020 2244   RDW 11.9 08/18/2020 2244   LYMPHSABS 1.6 03/23/2014 1147   MONOABS 0.4 03/23/2014 1147   EOSABS 0.1 03/23/2014 1147   BASOSABS 0.0 03/23/2014 1147    CMP     Component Value Date/Time   NA 139 08/18/2020 2244   K 3.6 08/18/2020 2244   CL 100 08/18/2020 2244   CO2 27 08/18/2020 2244   GLUCOSE 188 (H) 08/18/2020 2244   BUN 20 08/18/2020 2244   CREATININE 1.61 (H) 08/18/2020 2244   CREATININE 1.30 (H) 01/07/2014 1015   CALCIUM 9.2 08/18/2020 2244   PROT 8.0 03/23/2014 1147   ALBUMIN 3.7 03/23/2014 1147   AST 31 03/23/2014 1147   ALT 18 03/23/2014 1147   ALKPHOS 111 03/23/2014  1147   BILITOT 0.6 03/23/2014 1147   GFRNONAA 33 (L) 08/18/2020 2244   GFRAA 38 (L) 08/18/2020 2244    Lipid Panel     Component Value Date/Time   CHOL 179 03/24/2014 0510   CHOL 195 01/21/2014 1023   TRIG 79 03/24/2014 0510   HDL 38 (L) 03/24/2014 0510   HDL 43 01/21/2014 1023   CHOLHDL 4.7 03/24/2014 0510   VLDL 16 03/24/2014 0510   LDLCALC 125 (H) 03/24/2014 0510   LDLCALC 138 (H) 01/21/2014 1023     Imaging I have reviewed the images obtained:  CT-scan of the brain-Tiny 0.3 cm hyperdense focus in the central pons, not previously seen. This could represent a tiny focus of acute hemorrhage. Consider further evaluation with MRI brain without and with IV contrast. 2. Chronic right frontoparietal encephalomalacia. 3. Prominent chronic small vessel ischemic changes in the cerebral white matter.   Etta Quill PA-C [facility] [phone]  M-F  (9:00 am- 5:00 PM)  08/19/2020, 10:06 AM     Assessment:  This is a 69 year old female presented to the hospital secondary to weakness on her left side.  She has had the weakness for 5 days.  On exam she is 4/5 strength on the left arm and leg.  She also has a chronic left foot drop.  CT scan showed a tiny 0.3 cm hyperdense focus in the central pons that was not seen previously.  At this point could represent a acute hemorrhage.  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  MRI/MRA head without contrast 2.  Keep platelets greater than 100 3.  Keep blood pressure under 021 systolically 4.  Keep INR less than 1.4 5   no antiplatelets

## 2020-08-19 NOTE — Hospital Course (Addendum)
Admitted 08/18/2020  Allergies: Patient has no known allergies. Pertinent Hx: CVA with residual L sided weakness, GERD, HTN  Patient interviewed at bedside. Denies any particular complaints this morning. Discussed her workup which has been reassuring. Agreeable to COVID vaccine today. Agreeable to SNF.

## 2020-08-19 NOTE — Progress Notes (Signed)
Received report from ED RN. Room ready for patient. 

## 2020-08-19 NOTE — ED Notes (Signed)
Patient transported to CT 

## 2020-08-19 NOTE — ED Provider Notes (Signed)
[facility] EMERGENCY DEPARTMENT Provider Note   CSN: [id number] Arrival date & time: 08/18/20  2228     History Chief Complaint  Patient presents with  . Weakness    Deborah Whitehead is a 69 y.o. female.   Weakness Severity:  Moderate Onset quality:  Gradual Duration:  2 days Timing:  Constant Progression:  Unchanged Chronicity:  New Context: not recent infection   Relieved by:  Nothing Worsened by:  Nothing Ineffective treatments:  None tried Associated symptoms: near-syncope and shortness of breath   Associated symptoms: no arthralgias, no chest pain, no cough, no diarrhea, no dysuria, no fever, no headaches, no loss of consciousness, no nausea, no seizures, no syncope and no vomiting        Past Medical History:  Diagnosis Date  . Anxiety   . Arthritis   . Depression   . Fibroids    abd  . GERD (gastroesophageal reflux disease)   . Hypertension    dr Ayesha Rumpf     immanuel fp  . Shortness of breath   . Stroke [facility])    memory loss  . Substance abuse [facility])     Patient Active Problem List   Diagnosis Date Noted  . TIA (transient ischemic attack) 03/23/2014  . Anxiety 03/23/2014  . CVA (cerebral vascular accident) ([place]) 07/15/2013  . Obesity, unspecified 07/13/2013  . Other and unspecified hyperlipidemia 07/13/2013  . Cigarette smoker 07/13/2013  . Stroke ([place]) 07/10/2013  . HTN (hypertension) 07/10/2013  . Abdominal pain, unspecified site 05/13/2013    Past Surgical History:  Procedure Laterality Date  . ABDOMINAL HYSTERECTOMY    . CESAREAN SECTION    . CHOLECYSTECTOMY    . INSERTION OF MESH N/A 06/25/2013   Procedure: INSERTION OF MESH;  Surgeon: Merrie Roof, MD;  Location: [place];  Service: General;  Laterality: N/A;  . 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 LOOP RECORDER IMPLANT N/A 03/26/2014   Procedure: LOOP RECORDER IMPLANT;  Surgeon: Deboraha Sprang, MD;  Location: [facility]  CATH LAB;  Service: Cardiovascular;  Laterality: N/A;  . TEE WITHOUT CARDIOVERSION N/A 03/25/2014   Procedure: TRANSESOPHAGEAL ECHOCARDIOGRAM (TEE);  Surgeon: Dorothy Spark, MD;  Location: [place];  Service: Cardiovascular;  Laterality: N/A;  . VENTRAL HERNIA REPAIR N/A 06/25/2013   Procedure: LAPAROSCOPIC VENTRAL HERNIA;  Surgeon: Merrie Roof, MD;  Location: [place];  Service: General;  Laterality: N/A;     OB History   No obstetric history on file.     Family History  Problem Relation Age of Onset  . Cancer Mother        Breast  . Hypertension Mother   . Diabetes Mother   . Hyperlipidemia Mother   . Thyroid disease Mother   . Cancer Father        Prostate  . Asthma Brother     Social History   Tobacco Use  . Smoking status: Former Smoker    Packs/day: 0.25    Years: 15.00    Pack years: 3.75    Quit date: 06/18/2013    Years since quitting: 7.1  . Smokeless tobacco: Never Used  Substance Use Topics  . Alcohol use: No    Comment: stopped  . Drug use: Yes    Types: Cocaine    Comment: stopped x 1 yr    Home Medications Prior to Admission medications   Medication Sig Start  Date End Date Taking? Authorizing Provider  amLODipine (NORVASC) 5 MG tablet Take 5 mg by mouth daily. 08/05/20  Yes [provider]  clotrimazole-betamethasone (LOTRISONE) cream Apply 1 application topically daily as needed.   Yes [provider]  furosemide (LASIX) 20 MG tablet Take 20 mg by mouth daily. 08/05/20  Yes [provider]  ibuprofen (ADVIL,MOTRIN) 200 MG tablet Take 200 mg by mouth every 6 (six) hours as needed for moderate pain.   Yes [provider]  losartan (COZAAR) 100 MG tablet Take 100 mg by mouth daily.   Yes [provider]  metFORMIN (GLUCOPHAGE-XR) 500 MG 24 hr tablet Take 500 mg by mouth daily.   Yes [provider]  Multiple Vitamin (MULTIVITAMIN) capsule Take 1 capsule by mouth daily.   Yes [provider]  omeprazole (PRILOSEC) 40 MG capsule Take 40 mg by mouth daily.   Yes [provider]  ALPRAZolam (XANAX) 0.25 MG tablet Take 1 tablet (0.25 mg total) by mouth 3 (three) times daily as needed for sleep or anxiety. Patient not taking: Reported on 08/19/2020 03/29/14   Cristal Ford, DO  atorvastatin (LIPITOR) 40 MG tablet Take 1 tablet (40 mg total) by mouth daily at 6 PM. Patient not taking: Reported on 08/19/2020 03/29/14   Cristal Ford, DO  clopidogrel (PLAVIX) 75 MG tablet Take 1 tablet (75 mg total) by mouth daily with breakfast. Patient not taking: Reported on 08/19/2020 07/21/13   Cathlyn Parsons, PA-C    Allergies    Patient has no known allergies.  Review of Systems   Review of Systems  Constitutional: Positive for diaphoresis and fatigue. Negative for chills and fever.  HENT: Negative for congestion and rhinorrhea.   Respiratory: Positive for shortness of breath. Negative for cough.   Cardiovascular: Positive for near-syncope. Negative for chest pain, palpitations and syncope.  Gastrointestinal: Negative for diarrhea, nausea and vomiting.  Genitourinary: Negative for difficulty urinating and dysuria.  Musculoskeletal: Negative for arthralgias and back pain.  Skin: Negative for rash and wound.  Neurological: Positive for weakness. Negative for seizures, loss of consciousness, light-headedness and headaches.  Psychiatric/Behavioral: Positive for confusion.    Physical Exam Updated Vital Signs BP 131/75   Pulse 79   Temp 98.5 F (36.9 C) (Oral)   Resp 18   SpO2 94%   Physical Exam Vitals and nursing note reviewed. Exam conducted with a chaperone present.  Constitutional:      General: She is not in acute distress.    Appearance: Normal appearance.  HENT:     Head: Normocephalic and atraumatic.     Nose: No rhinorrhea.  Eyes:     General:        Right eye: No discharge.        Left eye: No discharge.     Conjunctiva/sclera: Conjunctivae normal.    Cardiovascular:     Rate and Rhythm: Normal rate and regular rhythm.  Pulmonary:     Effort: Pulmonary effort is normal. No respiratory distress.     Breath sounds: No stridor.  Abdominal:     General: Abdomen is flat. There is no distension.     Palpations: Abdomen is soft.  Musculoskeletal:        General: No tenderness or signs of injury.  Skin:    General: Skin is warm and dry.     Capillary Refill: Capillary refill takes less than 2 seconds.  Neurological:     General: No focal deficit present.  Mental Status: She is alert. Mental status is at baseline.     Cranial Nerves: No cranial nerve deficit.     Motor: Weakness present.     Comments: 5 out of 5 motor strength in the upper extremity and lower extremity on the right, 3 out of 5 on the left, sensation intact throughout  Psychiatric:        Mood and Affect: Mood normal.        Behavior: Behavior normal.     ED Results / Procedures / Treatments   Labs (all labs ordered are listed, but only abnormal results are displayed) Labs Reviewed  BASIC METABOLIC PANEL - Abnormal; Notable for the following components:      Result Value   Glucose, Bld 188 (*)    Creatinine, Ser 1.61 (*)    GFR calc non Af Amer 33 (*)    GFR calc Af Amer 38 (*)    All other components within normal limits  URINALYSIS, ROUTINE W REFLEX MICROSCOPIC - Abnormal; Notable for the following components:   APPearance HAZY (*)    Protein, ur 100 (*)    Leukocytes,Ua LARGE (*)    Bacteria, UA RARE (*)    All other components within normal limits  D-DIMER, QUANTITATIVE (NOT AT [facility]) - Abnormal; Notable for the following components:   D-Dimer, Quant 2.05 (*)    All other components within normal limits  SARS CORONAVIRUS 2 BY RT PCR (HOSPITAL ORDER, [place] [facility])  CBC  BRAIN NATRIURETIC PEPTIDE  CBG MONITORING, ED  TROPONIN I (HIGH SENSITIVITY)    EKG EKG Interpretation  Date/Time:  Friday August 19 2020 08:36:41  EDT Ventricular Rate:  82 PR Interval:    QRS Duration: 100 QT Interval:  391 QTC Calculation: 457 R Axis:   33 Text Interpretation: Sinus rhythm Low voltage, precordial leads Minimal ST elevation, inferior leads Confirmed by Dewaine Conger [phone]) on 08/19/2020 9:35:40 AM   Radiology DG Chest 2 View  Result Date: 08/19/2020 CLINICAL DATA:  Shortness of breath. EXAM: CHEST - 2 VIEW COMPARISON:  June 17, 2013. FINDINGS: The heart size and mediastinal contours are within normal limits. Both lungs are clear. No pneumothorax or pleural effusion is noted. The visualized skeletal structures are unremarkable. IMPRESSION: No active cardiopulmonary disease. Electronically Signed   By: Marijo Conception M.D.   On: 08/19/2020 08:15   CT Head Wo Contrast  Result Date: 08/19/2020 CLINICAL DATA:  Dizziness. Near syncopal episode. History of CVA with left-sided deficits. EXAM: CT HEAD WITHOUT CONTRAST TECHNIQUE: Contiguous axial images were obtained from the base of the skull through the vertex without intravenous contrast. COMPARISON:  03/23/2014 head CT. FINDINGS: Brain: Chronic right frontoparietal encephalomalacia. Ex vacuo dilatation of the right lateral ventricle. No evidence of extra-axial fluid collection. No mass effect or midline shift. There is a tiny 0.3 cm hyperdense focus in the central pons (series 3/image 7), not previously seen. Nonspecific prominent subcortical and periventricular white matter hypodensity, most in keeping with chronic small vessel ischemic change. Vascular: No acute abnormality. Skull: No evidence of calvarial fracture. Sinuses/Orbits: The visualized paranasal sinuses are essentially clear. Other:  The mastoid air cells are unopacified. IMPRESSION: 1. Tiny 0.3 cm hyperdense focus in the central pons, not previously seen. This could represent a tiny focus of acute hemorrhage. Consider further evaluation with MRI brain without and with IV contrast. 2. Chronic right frontoparietal  encephalomalacia. 3. Prominent chronic small vessel ischemic changes in the cerebral white matter. Critical  Value/emergent results were called by telephone at the time of interpretation on 08/19/2020 at 8:02 am to provider [facility] , who verbally acknowledged these results. Electronically Signed   By: Ilona Sorrel M.D.   On: 08/19/2020 08:06    Procedures .Critical Care Performed by: Breck Coons, MD Authorized by: Breck Coons, MD   Critical care provider statement:    Critical care time (minutes):  35   Critical care was time spent personally by me on the following activities:  Discussions with consultants, evaluation of patient's response to treatment, examination of patient, ordering and performing treatments and interventions, ordering and review of laboratory studies, ordering and review of radiographic studies, pulse oximetry, re-evaluation of patient's condition, obtaining history from patient or surrogate, review of old charts and blood draw for specimens   (including critical care time)  Medications Ordered in ED Medications  LORazepam (ATIVAN) injection 1 mg (has no administration in time range)    ED Course  I have reviewed the triage vital signs and the nursing notes.  Pertinent labs & imaging results that were available during my care of the patient were reviewed by me and considered in my medical decision making (see chart for details).    MDM Rules/Calculators/A&P                          69 year old female history of stroke with residual left-sided weakness comes in with worsening weakness.  I spoke with her daughter as well regarding symptoms, there may have been an episode of syncope she was seen and bent over her walker sweaty, partially responsive.  Context of that was unknown by the daughter.  She arrives with stable vital signs mentating normally at her baseline for strength based on her history given.  She sent for CT scan without contrast.  Radiology reviews it  sees some focal density in the pons midbrain, concern for possible small hemorrhage versus other lesion.  I have reviewed these images as well and shared this information with the patient and family.  Neurology will be consulted.  I reviewed the basic labs done in triage that show slightly elevated creatinine, most recent when I have to compare to his from 2017 and it is 1.6 relative to the old one of 1.3.  CBC is unremarkable.  Still waiting for other labs, though currently due to laboratory abnormalities with regards to processing labs are delayed.  Patient will also get a chest x-ray EKG and likely require admission  I spoke to the neurologist on call they recommend MRI with and without contrast as well as an MRA of the head and neck to evaluate this lesion.  The family is aware the patient is where she was given mild sedation for this procedure.  The patient needs contrast for MRI, the D-dimer is also elevated, this is ordered because there is a possible syncopal episode at home, at this point do not want to contrast for more than 1 time for now, she is without chest pain shortness of breath or hypoxia or tachycardia here so I feel the preference would be the CT scan with contrast of the brain as this seems to be more pressing however she will need follow-up and further evaluation for PE as an inpatient.  I consulted the hospitalist for further work-up and admission they agree.  They will admit the patient.  The patient will be admitted to the hospitalist.  For the remainder this patient's  care please see inpatient team notes.  I will intervene as needed while the patient remains in the emergency department.   CRITICAL CARE Performed by: Breck Coons   Total critical care time: 35 minutes  Critical care time was exclusive of separately billable procedures and treating other patients.  Critical care was necessary to treat or prevent imminent or life-threatening deterioration.  Critical care was  time spent personally by me on the following activities: development of treatment plan with patient and/or surrogate as well as nursing, discussions with consultants, evaluation of patient's response to treatment, examination of patient, obtaining history from patient or surrogate, ordering and performing treatments and interventions, ordering and review of laboratory studies, ordering and review of radiographic studies, pulse oximetry and re-evaluation of patient's condition.   Final Clinical Impression(s) / ED Diagnoses Final diagnoses:  Lesion of pons  Imbalance    Rx / DC Orders ED Discharge Orders    None       Breck Coons, MD 08/19/20 1114

## 2020-08-19 NOTE — ED Notes (Signed)
IV attempt unsuccessful x 2

## 2020-08-19 NOTE — Evaluation (Signed)
Clinical/Bedside Swallow Evaluation Patient Details  Name: Deborah Whitehead MRN: [id number] Date of Birth: [birth year]  Today's Date: 08/19/2020 Time: SLP Start Time (ACUTE ONLY): 1425 SLP Stop Time (ACUTE ONLY): 1435 SLP Time Calculation (min) (ACUTE ONLY): 10 min  Past Medical History:  Past Medical History:  Diagnosis Date  . Anxiety   . Arthritis   . Depression   . Fibroids    abd  . GERD (gastroesophageal reflux disease)   . Hypertension    dr Ayesha Rumpf     immanuel fp  . Shortness of breath   . Stroke [facility])    memory loss  . Substance abuse [facility])    Past Surgical History:  Past Surgical History:  Procedure Laterality Date  . ABDOMINAL HYSTERECTOMY    . CESAREAN SECTION    . CHOLECYSTECTOMY    . INSERTION OF MESH N/A 06/25/2013   Procedure: INSERTION OF MESH;  Surgeon: Merrie Roof, MD;  Location: [place];  Service: General;  Laterality: N/A;  . 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 LOOP RECORDER IMPLANT N/A 03/26/2014   Procedure: LOOP RECORDER IMPLANT;  Surgeon: Deboraha Sprang, MD;  Location: [facility] CATH LAB;  Service: Cardiovascular;  Laterality: N/A;  . TEE WITHOUT CARDIOVERSION N/A 03/25/2014   Procedure: TRANSESOPHAGEAL ECHOCARDIOGRAM (TEE);  Surgeon: Dorothy Spark, MD;  Location: [place];  Service: Cardiovascular;  Laterality: N/A;  . VENTRAL HERNIA REPAIR N/A 06/25/2013   Procedure: LAPAROSCOPIC VENTRAL HERNIA;  Surgeon: Merrie Roof, MD;  Location: [place];  Service: General;  Laterality: N/A;   HPI:  This is a 69 year old female presented to the hospital secondary to weakness on her left side.  She has had the weakness for 5 days.  On exam she is 4/5 strength on the left arm and leg.  She also has a chronic left foot drop.  CT scan showed a tiny 0.3 cm hyperdense focus in the central pons that was not seen previously.    Assessment / Plan / Recommendation Clinical Impression  Pt presents as mildly confused,  had been given ativan for imaging. Regardless, she was able to participate ina ssessment, no s/s of aspiration observed and no evidence of dysphagia. Will initaite regualr diet and thin liquids and sign off.  SLP Visit Diagnosis: Dysphagia, unspecified (R13.10)    Aspiration Risk  Mild aspiration risk    Diet Recommendation Regular;Thin liquid   Liquid Administration via: Cup;Straw Medication Administration: Whole meds with liquid Supervision: Patient able to self feed Postural Changes: Seated upright at 90 degrees    Other  Recommendations     Follow up Recommendations        Frequency and Duration            Prognosis        Swallow Study   General HPI: This is a 69 year old female presented to the hospital secondary to weakness on her left side.  She has had the weakness for 5 days.  On exam she is 4/5 strength on the left arm and leg.  She also has a chronic left foot drop.  CT scan showed a tiny 0.3 cm hyperdense focus in the central pons that was not seen previously.  Type of Study: Bedside Swallow Evaluation Previous Swallow Assessment: none Diet Prior to this Study: NPO Temperature Spikes Noted: No Respiratory Status: Room air History of Recent Intubation: No Behavior/Cognition: Alert;Cooperative;Requires cueing Oral Cavity Assessment: Within  Functional Limits Oral Care Completed by SLP: No Oral Cavity - Dentition: Adequate natural dentition Vision: Functional for self-feeding Self-Feeding Abilities: Able to feed self Patient Positioning: Upright in bed Baseline Vocal Quality: Normal Volitional Cough: Strong Volitional Swallow: Able to elicit    Oral/Motor/Sensory Function Overall Oral Motor/Sensory Function: Within functional limits   Ice Chips     Thin Liquid Thin Liquid: Within functional limits Presentation: Cup;Straw    Nectar Thick Nectar Thick Liquid: Not tested   Honey Thick Honey Thick Liquid: Not tested   Puree Puree: Within functional limits    Solid     Solid: Within functional limits Presentation: Deborah Whitehead, Deborah Whitehead Pager [phone] Office [phone]  Lynann Beaver 08/19/2020,2:46 PM

## 2020-08-19 NOTE — Progress Notes (Signed)
New Admission Note:   Arrival Method: Arrived from [facility] ED via stetcher Mental Orientation: Alert and oriented x4 Telemetry: Box #11 Assessment: Completed Skin: Intact IV: Rt Upper Arm-NSL Pain: 0/10 Tubes: [place] Safety Measures: Safety Fall Prevention Plan has been discussed.  Admission: Completed 5MW Orientation: Patient has been orientated to the room, unit and staff.  Family: None at bedside  Orders have been reviewed and implemented. Will continue to monitor the patient. Call light has been placed within reach and bed alarm has been activated.   Caragh Gasper [employer], RN-BC Phone number: [phone]

## 2020-08-20 ENCOUNTER — Inpatient Hospital Stay (HOSPITAL_COMMUNITY): Payer: Medicare HMO

## 2020-08-20 DIAGNOSIS — Z8673 Personal history of transient ischemic attack (TIA), and cerebral infarction without residual deficits: Secondary | ICD-10-CM

## 2020-08-20 DIAGNOSIS — E1165 Type 2 diabetes mellitus with hyperglycemia: Secondary | ICD-10-CM

## 2020-08-20 DIAGNOSIS — I10 Essential (primary) hypertension: Secondary | ICD-10-CM

## 2020-08-20 DIAGNOSIS — G459 Transient cerebral ischemic attack, unspecified: Secondary | ICD-10-CM

## 2020-08-20 DIAGNOSIS — E782 Mixed hyperlipidemia: Secondary | ICD-10-CM

## 2020-08-20 LAB — RENAL FUNCTION PANEL
Albumin: 2.9 g/dL — ABNORMAL LOW (ref 3.5–5.0)
Anion gap: 10 (ref 5–15)
BUN: 15 mg/dL (ref 8–23)
CO2: 28 mmol/L (ref 22–32)
Calcium: 8.8 mg/dL — ABNORMAL LOW (ref 8.9–10.3)
Chloride: 103 mmol/L (ref 98–111)
Creatinine, Ser: 1.58 mg/dL — ABNORMAL HIGH (ref 0.44–1.00)
GFR calc Af Amer: 39 mL/min — ABNORMAL LOW (ref >60)
GFR calc non Af Amer: 33 mL/min — ABNORMAL LOW (ref >60)
Glucose, Bld: 186 mg/dL — ABNORMAL HIGH (ref 70–99)
Phosphorus: 3.3 mg/dL (ref 2.5–4.6)
Potassium: 3.8 mmol/L (ref 3.5–5.1)
Sodium: 141 mmol/L (ref 135–145)

## 2020-08-20 LAB — CBC
HCT: 38.2 % (ref 36.0–46.0)
Hemoglobin: 11.8 g/dL — ABNORMAL LOW (ref 12.0–15.0)
MCH: 29.4 pg (ref 26.0–34.0)
MCHC: 30.9 g/dL (ref 30.0–36.0)
MCV: 95 fL (ref 80.0–100.0)
Platelets: 218 10*3/uL (ref 150–400)
RBC: 4.02 MIL/uL (ref 3.87–5.11)
RDW: 12.1 % (ref 11.5–15.5)
WBC: 5.1 10*3/uL (ref 4.0–10.5)
nRBC: 0 % (ref 0.0–0.2)

## 2020-08-20 LAB — BASIC METABOLIC PANEL
Anion gap: 11 (ref 5–15)
BUN: 18 mg/dL (ref 8–23)
CO2: 28 mmol/L (ref 22–32)
Calcium: 8.8 mg/dL — ABNORMAL LOW (ref 8.9–10.3)
Chloride: 99 mmol/L (ref 98–111)
Creatinine, Ser: 1.51 mg/dL — ABNORMAL HIGH (ref 0.44–1.00)
GFR calc Af Amer: 41 mL/min — ABNORMAL LOW (ref >60)
GFR calc non Af Amer: 35 mL/min — ABNORMAL LOW (ref >60)
Glucose, Bld: 137 mg/dL — ABNORMAL HIGH (ref 70–99)
Potassium: 3.9 mmol/L (ref 3.5–5.1)
Sodium: 138 mmol/L (ref 135–145)

## 2020-08-20 LAB — RAPID URINE DRUG SCREEN, HOSP PERFORMED
Amphetamines: NOT DETECTED
Barbiturates: NOT DETECTED
Benzodiazepines: NOT DETECTED
Cocaine: NOT DETECTED
Opiates: NOT DETECTED
Tetrahydrocannabinol: NOT DETECTED

## 2020-08-20 LAB — LIPID PANEL
Cholesterol: 219 mg/dL — ABNORMAL HIGH (ref 0–200)
HDL: 33 mg/dL — ABNORMAL LOW (ref >40)
LDL Cholesterol: 164 mg/dL — ABNORMAL HIGH (ref 0–99)
Total CHOL/HDL Ratio: 6.6 RATIO
Triglycerides: 112 mg/dL (ref <150)
VLDL: 22 mg/dL (ref 0–40)

## 2020-08-20 LAB — HIV ANTIBODY (ROUTINE TESTING W REFLEX): HIV Screen 4th Generation wRfx: NONREACTIVE

## 2020-08-20 LAB — GLUCOSE, CAPILLARY
Glucose-Capillary: 125 mg/dL — ABNORMAL HIGH (ref 70–99)
Glucose-Capillary: 194 mg/dL — ABNORMAL HIGH (ref 70–99)
Glucose-Capillary: 155 mg/dL — ABNORMAL HIGH (ref 70–99)
Glucose-Capillary: 153 mg/dL — ABNORMAL HIGH (ref 70–99)

## 2020-08-20 LAB — TSH: TSH: 2.005 u[IU]/mL (ref 0.350–4.500)

## 2020-08-20 MED ORDER — IOHEXOL 350 MG/ML SOLN
75.0000 mL | Freq: Once | INTRAVENOUS | Status: AC | PRN
  Administered 2020-08-20: 75 mL via INTRAVENOUS

## 2020-08-20 MED ORDER — ATORVASTATIN CALCIUM 80 MG PO TABS
80.0000 mg | ORAL_TABLET | Freq: Every day | ORAL | Status: DC
  Administered 2020-08-20 – 2020-08-23 (×4): 80 mg via ORAL
  Filled 2020-08-20 (×4): qty 1

## 2020-08-20 MED ORDER — SODIUM CHLORIDE 0.9 % IV SOLN: INTRAVENOUS | Status: DC

## 2020-08-20 MED ORDER — ATORVASTATIN CALCIUM 40 MG PO TABS: 40.0000 mg | ORAL_TABLET | Freq: Every day | ORAL | Status: DC

## 2020-08-20 MED ORDER — CLOPIDOGREL BISULFATE 75 MG PO TABS
75.0000 mg | ORAL_TABLET | Freq: Every day | ORAL | Status: DC
  Administered 2020-08-20 – 2020-08-23 (×4): 75 mg via ORAL
  Filled 2020-08-20 (×4): qty 1

## 2020-08-20 NOTE — Progress Notes (Signed)
Lower extremity venous bilateral study completed.   Please see CV Proc for preliminary results.   Shonita Rinck  

## 2020-08-20 NOTE — Progress Notes (Signed)
Subjective:  Ms. Knies reports that she is doing "okay" this morning, though she is visibly anxious and intermittently tearful during the interview/exam. She denies any pain, shortness of breath, or nausea today. She reports having a strong appetite and ate most of her breakfast.  Much of the interview was spent clarifying the timeline of events leading up to her hospitalization. It seems that the onset of her symptoms has been gradual, over the course of weeks to months. It also seems that her symptoms are almost always associated with standing/activity. She denies any confusion/lightheadedness/presyncope associated with her falls. "I just get weak and I know I'm going down." The only symptom she endorses at rest is occasional palpitations. She states that sometimes her anxiety can present with dyspnea and pleuritic chest pain.  She has diabetes on home metformin but does not regularly check her blood sugar. Denies ever having symptomatic hypoglycemic events and has never checked her blood sugar during an episode of weakness or after a fall.  She denies any dysuria or changes in her bowels. She urinated without issue this morning and her last bowel movement was yesterday.   Objective:  Vital signs in last 24 hours: Vitals:   08/19/20 2036 08/20/20 0102 08/20/20 0525 08/20/20 0526  BP: 124/79 128/84  108/78  Pulse: 85 81  85  Resp: 19 19  19   Temp: 98.7 F (37.1 C) 98.5 F (36.9 C)  98.2 F (36.8 C)  TempSrc: Oral     SpO2: 96% 94%  97%  Weight:   92 kg   Height: 5\' 3"  (1.6 m)      Weight change:   Intake/Output Summary (Last 24 hours) at 08/20/2020 0856 Last data filed at 08/20/2020 5009 Gross per 24 hour  Intake 120 ml  Output 500 ml  Net -380 ml   Physical Exam Constitutional:      Comments: Anxious-appearing. Intermittently tearful.   Cardiovascular:     Rate and Rhythm: Normal rate.     Pulses: Normal pulses.     Heart sounds: Murmur heard.  Crescendo decrescendo  systolic murmur is present with a grade of 3/6.      Comments: 3/6 systolic crescendo/decrescendo murmur heard best at R sternal border Pulmonary:     Effort: Pulmonary effort is normal.     Breath sounds: Normal breath sounds.  Abdominal:     General: Bowel sounds are normal.     Palpations: Abdomen is soft.     Tenderness: There is no abdominal tenderness.  Musculoskeletal:     Right lower leg: 1+ Pitting Edema present.     Left lower leg: 1+ Pitting Edema present.  Neurological:     Mental Status: She is alert.     Comments: Strength 4/5 in LUE and LLE Strength 5/5 in RUE and RLE Sensation grossly intact in all extremities  Psychiatric:        Mood and Affect: Mood is anxious.        Speech: Speech normal.        Behavior: Behavior is cooperative.        Thought Content: Thought content normal.      Assessment/Plan:  Progressive weakness and dyspnea on exertion: - Strong suspicion for aortic stenosis or other cardiac etiology given more chronic symptom onset and new 3/6 systolic murmur. Past echo (2015) showing moderate thickening of AV and mild mitral regurgitation. Will obtain repeat Echo today.   - MRI/MRA head negative for acute stroke. - CTA Chest yesterday  effectively ruled out PE. Based on our conversation today, it seems that the pleuritic chest pain she was having yesterday was likely related to her anxiety.  - Vascular neurology following, grateful for their recommendations. - CTA Neck today per vascular neurology - TSH today to assess for thyroid etiology. - Consider orthostatic process. Will obtain orthostatic vitals today.  - Also cannot rule out hypoglycemic event given diaphoresis during these episodes.  Anxiety: - Baseline anxiety exacerbated by medical procedures.  - Verbally prepared her for the following procedures today: echocardiogram and CTA neck.    LOS: 1 day   Pearla Dubonnet, Medical Student 08/20/2020, 8:56 AM
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
## 2020-08-20 NOTE — Progress Notes (Signed)
Attempted to get standing scale weight on patient but patient can barely stand with two staff assisting.

## 2020-08-20 NOTE — Evaluation (Signed)
Physical Therapy Evaluation Patient Details Name: Deborah Whitehead MRN: [id number] DOB: [birth year] Today's Date: 08/20/2020   History of Present Illness  The pt is a 69 yo female presenting with worsening weakness an possible syncopa episode. Upon workup, CT of her head showed a possible small acute pontine hemorrhage, awaiting further workup at this time. PMH includes: CVA with L-sided weakness, DM, HTN, and GERD.  Clinical Impression  Pt in bed upon arrival of PT, agreeable to evaluation at this time. Prior to admission the pt was mobilizing with use of a RW, and had recently regressed to needing supervision for all mobility and assist with ADLs from her daughter or [facility] aide. The pt now presents with limitations in functional mobility, strength, stability, and activity tolerance due to above dx, and will continue to benefit from skilled PT to address these deficits. The pt required modA to complete initial bed mobility and transfers, then maxA to complete a few small lateral steps to the chair. In addition to marked deficits in strength and stability, she is limited by her fear of falling and significant anxiety with movement at this time. The pt is highly motivated and eager to participate with therapy, and will benefit from continued skilled PT acutely and following d/c to facilitate safe return home.      Follow Up Recommendations SNF;Supervision/Assistance - 24 hour    Equipment Recommendations   (defer to post acute)    Recommendations for Other Services       Precautions / Restrictions Precautions Precautions: Fall Restrictions Weight Bearing Restrictions: No      Mobility  Bed Mobility Overal bed mobility: Needs Assistance Bed Mobility: Supine to Sit     Supine to sit: Mod assist;HOB elevated     General bed mobility comments: modA to move BLE and to elevate trunk from Rossie Overall transfer level: Needs assistance Equipment used: Rolling walker (2  wheeled) Transfers: Sit to/from Stand Sit to Stand: Max assist;From elevated surface         General transfer comment: maxA to power up. pt initially with limited trunk flexion and forward momentum due to fear of falling, minorly improved with education and reps. modA to steady in standing with BUE support. significant verbal cues  Ambulation/Gait Ambulation/Gait assistance: Max assist Gait Distance (Feet): 2 Feet Assistive device: Rolling walker (2 wheeled) Gait Pattern/deviations: Step-to pattern;Decreased stride length   Gait velocity interpretation: <1.31 ft/sec, indicative of household ambulator General Gait Details: small lateral steps with minimal clearance. pt with heavy lean on bed/chair with back of her legs. verbalizing fear of falling multiple times through mobility      Balance Overall balance assessment: Needs assistance Sitting-balance support: Bilateral upper extremity supported;Feet supported Sitting balance-Leahy Scale: Poor   Postural control: Posterior lean Standing balance support: Bilateral upper extremity supported;During functional activity Standing balance-Leahy Scale: Poor Standing balance comment: reliant on BUE support and modA                             Pertinent Vitals/Pain Pain Assessment: No/denies pain    Home Living Family/patient expects to be discharged to:: Private residence Living Arrangements: Children Available Help at Discharge: Family;Home health;Available 24 hours/day (HH aide 4 hours/day 5days/week. daughter works from home) Type of Home: House Home Access: Level entry     [place]: One Mower: Environmental consultant - 2 wheels      Prior Function Level of Independence: Needs assistance  Gait / Transfers Assistance Needed: mobilize with RW, previously independent, now needs supervision from aide  ADL's / Homemaking Assistance Needed: daughter completes IADLs and homemaking. aide 4 hrs/day for 5 days/week for  dressing and bathing  Comments: recent decline in independence due to progressive weakness     Hand Dominance   Dominant Hand: Right    Extremity/Trunk Assessment   Upper Extremity Assessment Upper Extremity Assessment: LUE deficits/detail LUE Deficits / Details: reduced grip strength, shoulder flexion and abduction. pt reports this is baseline after prior stroke    Lower Extremity Assessment Lower Extremity Assessment: Generalized weakness;LLE deficits/detail (poor functional use of strength she does have, complicated by fear of falling.) LLE Deficits / Details: unable to DF or PF at ankle, limited ROM active knee ext and hip flexion    Cervical / Trunk Assessment Cervical / Trunk Assessment: Kyphotic  Communication      Cognition Arousal/Alertness: Awake/alert Behavior During Therapy: Anxious (pt cryting through most of session, reports feeling "overwhelmed") Overall Cognitive Status: Within Functional Limits for tasks assessed                                 General Comments: pt tearful through session, reports she is overwhelmed and concerned about falling.      General Comments      Exercises     Assessment/Plan    PT Assessment Patient needs continued PT services  PT Problem List Decreased strength;Decreased mobility;Decreased range of motion;Decreased activity tolerance;Decreased balance;Decreased coordination       PT Treatment Interventions DME instruction;Therapeutic exercise;Gait training;Stair training;Functional mobility training;Therapeutic activities;Patient/family education;Balance training;Neuromuscular re-education    PT Goals (Current goals can be found in the Care Plan section)  Acute Rehab PT Goals Patient Stated Goal: improve walking, not to fall again PT Goal Formulation: With patient Time For Goal Achievement: 09/03/20 Potential to Achieve Goals: Good    Frequency Min 4X/week    AM-PAC PT "6 Clicks" Mobility  Outcome  Measure Help needed turning from your back to your side while in a flat bed without using bedrails?: A Little Help needed moving from lying on your back to sitting on the side of a flat bed without using bedrails?: A Lot Help needed moving to and from a bed to a chair (including a wheelchair)?: A Lot Help needed standing up from a chair using your arms (e.g., wheelchair or bedside chair)?: A Lot Help needed to walk in hospital room?: A Lot Help needed climbing 3-5 steps with a railing? : Total 6 Click Score: 12    End of Session Equipment Utilized During Treatment: Gait belt Activity Tolerance: Patient tolerated treatment well;Patient limited by fatigue Patient left: in chair;with chair alarm set;with call bell/phone within reach Nurse Communication: Mobility status PT Visit Diagnosis: Muscle weakness (generalized) (M62.81);Difficulty in walking, not elsewhere classified (R26.2);Hemiplegia and hemiparesis Hemiplegia - Right/Left: Left Hemiplegia - dominant/non-dominant: Non-dominant Hemiplegia - caused by:  (prior cva)    Time: 1631-1701 PT Time Calculation (min) (ACUTE ONLY): 30 min   Charges:   PT Evaluation $PT Eval Moderate Complexity: 1 Mod PT Treatments $Gait Training: 8-22 mins        Karma Ganja, PT, DPT   Acute Rehabilitation Department Pager #: [phone]  Otho Bellows 08/20/2020, 6:10 PM

## 2020-08-20 NOTE — Progress Notes (Signed)
Pt had 75 ml omni 350 extravasation right upper arm. Dr. Rolm Baptise assessed patient, arm was elevated. Verbal instructions were given to the patient and RN informed.

## 2020-08-21 ENCOUNTER — Inpatient Hospital Stay (HOSPITAL_COMMUNITY): Payer: Medicare HMO

## 2020-08-21 ENCOUNTER — Other Ambulatory Visit (HOSPITAL_COMMUNITY): Payer: Medicare HMO

## 2020-08-21 DIAGNOSIS — E1159 Type 2 diabetes mellitus with other circulatory complications: Secondary | ICD-10-CM

## 2020-08-21 DIAGNOSIS — E878 Other disorders of electrolyte and fluid balance, not elsewhere classified: Secondary | ICD-10-CM

## 2020-08-21 DIAGNOSIS — E7800 Pure hypercholesterolemia, unspecified: Secondary | ICD-10-CM

## 2020-08-21 DIAGNOSIS — N390 Urinary tract infection, site not specified: Secondary | ICD-10-CM

## 2020-08-21 DIAGNOSIS — R011 Cardiac murmur, unspecified: Secondary | ICD-10-CM

## 2020-08-21 DIAGNOSIS — N1831 Chronic kidney disease, stage 3a: Secondary | ICD-10-CM

## 2020-08-21 DIAGNOSIS — F419 Anxiety disorder, unspecified: Secondary | ICD-10-CM

## 2020-08-21 DIAGNOSIS — E11649 Type 2 diabetes mellitus with hypoglycemia without coma: Secondary | ICD-10-CM

## 2020-08-21 DIAGNOSIS — Z7984 Long term (current) use of oral hypoglycemic drugs: Secondary | ICD-10-CM

## 2020-08-21 DIAGNOSIS — R531 Weakness: Secondary | ICD-10-CM

## 2020-08-21 LAB — RENAL FUNCTION PANEL
Albumin: 2.8 g/dL — ABNORMAL LOW (ref 3.5–5.0)
Anion gap: 16 — ABNORMAL HIGH (ref 5–15)
BUN: 16 mg/dL (ref 8–23)
CO2: 20 mmol/L — ABNORMAL LOW (ref 22–32)
Calcium: 8.8 mg/dL — ABNORMAL LOW (ref 8.9–10.3)
Chloride: 108 mmol/L (ref 98–111)
Creatinine, Ser: 1.38 mg/dL — ABNORMAL HIGH (ref 0.44–1.00)
GFR calc Af Amer: 45 mL/min — ABNORMAL LOW (ref >60)
GFR calc non Af Amer: 39 mL/min — ABNORMAL LOW (ref >60)
Glucose, Bld: 133 mg/dL — ABNORMAL HIGH (ref 70–99)
Phosphorus: 3.1 mg/dL (ref 2.5–4.6)
Potassium: 5.7 mmol/L — ABNORMAL HIGH (ref 3.5–5.1)
Sodium: 144 mmol/L (ref 135–145)

## 2020-08-21 LAB — CBC WITH DIFFERENTIAL/PLATELET
Abs Immature Granulocytes: 0.01 10*3/uL (ref 0.00–0.07)
Basophils Absolute: 0 10*3/uL (ref 0.0–0.1)
Basophils Relative: 1 %
Eosinophils Absolute: 0.2 10*3/uL (ref 0.0–0.5)
Eosinophils Relative: 3 %
HCT: 39.4 % (ref 36.0–46.0)
Hemoglobin: 12.5 g/dL (ref 12.0–15.0)
Immature Granulocytes: 0 %
Lymphocytes Relative: 31 %
Lymphs Abs: 1.6 10*3/uL (ref 0.7–4.0)
MCH: 29.9 pg (ref 26.0–34.0)
MCHC: 31.7 g/dL (ref 30.0–36.0)
MCV: 94.3 fL (ref 80.0–100.0)
Monocytes Absolute: 0.5 10*3/uL (ref 0.1–1.0)
Monocytes Relative: 10 %
Neutro Abs: 2.8 10*3/uL (ref 1.7–7.7)
Neutrophils Relative %: 55 %
Platelets: 217 10*3/uL (ref 150–400)
RBC: 4.18 MIL/uL (ref 3.87–5.11)
RDW: 11.9 % (ref 11.5–15.5)
WBC: 5.1 10*3/uL (ref 4.0–10.5)
nRBC: 0 % (ref 0.0–0.2)

## 2020-08-21 LAB — GLUCOSE, CAPILLARY
Glucose-Capillary: 120 mg/dL — ABNORMAL HIGH (ref 70–99)
Glucose-Capillary: 193 mg/dL — ABNORMAL HIGH (ref 70–99)
Glucose-Capillary: 118 mg/dL — ABNORMAL HIGH (ref 70–99)
Glucose-Capillary: 251 mg/dL — ABNORMAL HIGH (ref 70–99)

## 2020-08-21 LAB — COMPREHENSIVE METABOLIC PANEL
ALT: 18 U/L (ref 0–44)
AST: 48 U/L — ABNORMAL HIGH (ref 15–41)
Albumin: 3 g/dL — ABNORMAL LOW (ref 3.5–5.0)
Alkaline Phosphatase: 110 U/L (ref 38–126)
Anion gap: 11 (ref 5–15)
BUN: 13 mg/dL (ref 8–23)
CO2: 26 mmol/L (ref 22–32)
Calcium: 8.8 mg/dL — ABNORMAL LOW (ref 8.9–10.3)
Chloride: 103 mmol/L (ref 98–111)
Creatinine, Ser: 1.37 mg/dL — ABNORMAL HIGH (ref 0.44–1.00)
GFR calc Af Amer: 46 mL/min — ABNORMAL LOW (ref >60)
GFR calc non Af Amer: 40 mL/min — ABNORMAL LOW (ref >60)
Glucose, Bld: 126 mg/dL — ABNORMAL HIGH (ref 70–99)
Potassium: 3.9 mmol/L (ref 3.5–5.1)
Sodium: 140 mmol/L (ref 135–145)
Total Bilirubin: 0.7 mg/dL (ref 0.3–1.2)
Total Protein: 7.4 g/dL (ref 6.5–8.1)

## 2020-08-21 MED ORDER — IOHEXOL 350 MG/ML SOLN
75.0000 mL | Freq: Once | INTRAVENOUS | Status: AC | PRN
  Administered 2020-08-21: 75 mL via INTRAVENOUS

## 2020-08-21 NOTE — Progress Notes (Signed)
Patient came back from CT without any event. Pt denies any discomfort. Will continue to assess.
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
## 2020-08-21 NOTE — Progress Notes (Signed)
   Subjective:   Patient states that she is very anxious talking to me today as I enter the room. She states that she continues to feel generally weak, not particularly worse in a single area. She denies any light-headedness or dizziness at rest, but notes that she became dizzy upon standing up for the nurse. She has not experienced pre-syncope or syncope in the hospital. She denies any known bleeding. Continues to endorse chronic cold intolerance but no other new symptoms.   Objective:  Vital signs in last 24 hours: Vitals:   08/20/20 1744 08/20/20 2103 08/21/20 0508 08/21/20 0934  BP: (!) 128/94 120/62 129/82 133/77  Pulse:  74 78 85  Resp: 18 18 19 18   Temp: 98.6 F (37 C) 98.8 F (37.1 C) 97.8 F (36.6 C) 99.1 F (37.3 C)  TempSrc: Oral Oral  Oral  SpO2: 100% 98% 98% 98%  Weight:   92.2 kg   Height:       General: Patient appears well. No acute distress. Eyes: Sclera non-icteric. No conjunctival injection.  Respiratory: Lungs are CTA, bilaterally. No wheezes, rales, or rhonchi.  Cardiovascular: Regular rate and rhythm. There is a 3/6 crescendo, decrescendo murmur in the 2nd right intercostal space. No rubs or gallops. No lower extremity edema. Neurological: Patient is alert. She has an essential tremor of her upper extremities.  Psychological: Patient appears visibly anxious.  Assessment/Plan:  Active Problems:   Weakness  # Generalized Weakness / Disequilibrium / Orthostasis Patient has a history of CVA and TIA. Brain imaging negative for acute stroke. CTA neck showed no significant stenosis. May be secondary to recrudescence vs. Hypoglycemic episodes given diaphoresis during episodes vs. Anemia vs. Deconditioning. Urine toxicology negative. TSH 2.005. Patient had > 20bpm increase in HR upon standing with light-headedness, consistent with orthostatic hypotension which alone may cause her symptoms. - Will check ECHO - Will continue to monitor CBG's - Will check CBC  - If  the above do not explain patient's symptoms, patient may likely be discharged to SNF for strengthening / conditioning  # 3/6 Systolic Murmur Patient has a 3/6 crescendo, decrescendo murmur in the 2nd right intercostal space consistent with aortic stenosis murmur. ECHO in 2015 showed moderate aortic valve thickening. Severe AS could cause SOB with exertion / falls that patient endorses. - Will check ECHO   # Anxiety  Patient very anxious on examination.  - will continue to monitor for now - Continue to avoid psychotropic medications  # Uncontrolled Type II DM Patient is on metformin at home. Her sugars have been overall well controlled on moderate SSI. - Continue SSI and CBG monitoring.   Prior to Admission Living Arrangement: Home Anticipated Discharge Location: SNF Barriers to Discharge: ECHO Dispo: Anticipated discharge in approximately 1 day.  Jeralyn Bennett, MD 08/21/2020, 3:23 PM Pager: [phone] After 5pm on weekdays and 1pm on weekends: On Call pager [phone]

## 2020-08-21 NOTE — TOC Initial Note (Signed)
Transition of Care [facility]) - Initial/Assessment Note    Patient Details  Name: Deborah Whitehead MRN: [id number] Date of Birth: 11-04-1951  [facility] CM/SW Contact:    Emeterio Reeve, [place] Phone Number: 08/21/2020, 1:46 PM  Clinical Narrative:                  CSW met with pt at bedside. CSW introduced self and explained her role at the hospital.  Pts daughter [name] was present for assessment. Pt stated PTA she was living at home with her daughter. Pt reports she lives in a two story home but rarely has to go upstairs. Pt reports she was using a rolling walker and noticed she was becoming weaker. Pt daughter reports they had an aid coming to the house to help with bathing and hygiene. Pts daughter works from home and is able to help with needs.   CSW reviewed pt/ot recc of snf. PT and daughter agree that SNF is needed to regain strength. Pt reports she has been to [facility] in the past and is ok with going back, pt also gave csw permission to fax out to other facilities in the area.     Pt stated she has not had the covid-19 vaccine but is interested in getting phizer 1st shot before discharge. CSW will inform MD and inpatient vaccine group. CSW informed pt and daughter that they will have to contact snfs about visitation policy of unvaccinated patients.   Pts FL2 is complete and pt has been faxed out.   Expected Discharge Plan: Skilled Nursing Facility Barriers to Discharge: Continued Medical Work up   Patient Goals and CMS Choice Patient states their goals for this hospitalization and ongoing recovery are:: Go to rehab then return home with daughter CMS Medicare.gov Compare Post Acute Care list provided to:: Patient Choice offered to / list presented to : Patient, Adult Children  Expected Discharge Plan and Services Expected Discharge Plan: [place]       Living arrangements for the past 2 months: Single Family Home                                       Prior Living Arrangements/Services Living arrangements for the past 2 months: Single Family Home Lives with:: Adult Children Patient language and need for interpreter reviewed:: Yes Do you feel safe going back to the place where you live?: Yes      Need for Family Participation in Patient Care: Yes (Comment) Care giver support system in place?: Yes (comment) Current home services: DME, Homehealth aide Criminal Activity/Legal Involvement Pertinent to Current Situation/Hospitalization: No - Comment as needed  Activities of Daily Living Home Assistive Devices/Equipment: CBG Meter, Eyeglasses, Dentures (specify type), Wheelchair ADL Screening (condition at time of admission) Patient's cognitive ability adequate to safely complete daily activities?: Yes Is the patient deaf or have difficulty hearing?: No Does the patient have difficulty seeing, even when wearing glasses/contacts?: No Does the patient have difficulty concentrating, remembering, or making decisions?: No Patient able to express need for assistance with ADLs?: Yes Does the patient have difficulty dressing or bathing?: Yes Independently performs ADLs?: No Communication: Independent Dressing (OT): Needs assistance Is this a change from baseline?: Pre-admission baseline Grooming: Independent Feeding: Independent Bathing: Needs assistance Is this a change from baseline?: Pre-admission baseline Toileting: Needs assistance Is this a change from baseline?: Pre-admission baseline In/Out Bed: Needs  assistance Is this a change from baseline?: Pre-admission baseline Walks in Home: Needs assistance Is this a change from baseline?: Pre-admission baseline Does the patient have difficulty walking or climbing stairs?: Yes Weakness of Legs: Both Weakness of Arms/Hands: Both  Permission Sought/Granted Permission sought to share information with : Chartered certified accountant granted to share  information with : Yes, Verbal Permission Granted     Permission granted to share info w AGENCY: SNF        Emotional Assessment Appearance:: Appears stated age Attitude/Demeanor/Rapport: Engaged Affect (typically observed): Appropriate Orientation: : Oriented to Situation, Oriented to  Time, Oriented to Place, Oriented to Self Alcohol / Substance Use: Not Applicable Psych Involvement: No (comment)  Admission diagnosis:  Weakness [R53.1] Imbalance [R26.89] Lesion of pons [G93.9] Patient Active Problem List   Diagnosis Date Noted  . Weakness 08/19/2020  . TIA (transient ischemic attack) 03/23/2014  . Anxiety 03/23/2014  . CVA (cerebral vascular accident) ([place]) 07/15/2013  . Obesity, unspecified 07/13/2013  . Other and unspecified hyperlipidemia 07/13/2013  . Cigarette smoker 07/13/2013  . Stroke ([place]) 07/10/2013  . HTN (hypertension) 07/10/2013  . Abdominal pain, unspecified site 05/13/2013   PCP:  Lin Landsman, MD Pharmacy:   [id number] [place] [place] [place] [id number]: [phone]: [phone] [id number]: [phone]: [phone] [id number] [place]: [phone]: [phone]    Social Determinants of Health (SDOH) Interventions    Readmission Risk Interventions No flowsheet data found.

## 2020-08-21 NOTE — Procedures (Signed)
Echo attempted. Patient needed bed changed. Nurse notified. Will attempt again later.

## 2020-08-21 NOTE — NC FL2 (Signed)
[place] LEVEL OF CARE SCREENING TOOL     IDENTIFICATION  Patient Name: Deborah Whitehead Birthdate: 1951-01-29 Sex: female Admission Date (Current Location): 08/18/2020  [facility] [place] Number:      Facility and Address:  [facility], [place] [address] [place], [place] [address]      Provider Number: [id number]  Attending Physician Name and Address:  Lucious Groves, DO  Relative Name and Phone Number:       Current Level of Care: Hospital Recommended Level of Care: [place] Prior Approval Number:    Date Approved/Denied:   PASRR Number: [id number]  Discharge Plan: SNF    Current Diagnoses: Patient Active Problem List   Diagnosis Date Noted  . Weakness 08/19/2020  . TIA (transient ischemic attack) 03/23/2014  . Anxiety 03/23/2014  . CVA (cerebral vascular accident) ([place]) 07/15/2013  . Obesity, unspecified 07/13/2013  . Other and unspecified hyperlipidemia 07/13/2013  . Cigarette smoker 07/13/2013  . Stroke ([place]) 07/10/2013  . HTN (hypertension) 07/10/2013  . Abdominal pain, unspecified site 05/13/2013    Orientation RESPIRATION BLADDER Height & Weight     Self, Time, Situation, Place  Normal Continent Weight: 203 lb 4.2 oz (92.2 kg) Height:  5\' 3"  (160 cm)  BEHAVIORAL SYMPTOMS/MOOD NEUROLOGICAL BOWEL NUTRITION STATUS      Continent Diet (see discharge summary)  AMBULATORY STATUS COMMUNICATION OF NEEDS Skin   Extensive Assist Verbally Normal                       Personal Care Assistance Level of Assistance  Bathing, Feeding, Dressing Bathing Assistance: Maximum assistance Feeding assistance: Independent Dressing Assistance: Maximum assistance     Functional Limitations Info  Sight, Hearing, Speech Sight Info: Adequate Hearing Info: Adequate Speech Info: Adequate    SPECIAL CARE FACTORS FREQUENCY  PT (By licensed PT), OT (By licensed OT)     PT Frequency: 5x a week OT Frequency: 5x  a week            Contractures      Additional Factors Info  Code Status, Allergies, Insulin Sliding Scale Code Status Info: full Allergies Info: NKA   Insulin Sliding Scale Info: Novolog 0-15 units 3x a day       Current Medications (08/21/2020):  This is the current hospital active medication list Current Facility-Administered Medications  Medication Dose Route Frequency Provider Last Rate Last Admin  . 0.9 %  sodium chloride infusion   Intravenous Continuous Rosalin Hawking, MD 75 mL/hr at 08/21/20 0025 New Bag at 08/21/20 0025  . acetaminophen (TYLENOL) tablet 650 mg  650 mg Oral Q6H PRN Asencion Noble, MD       Or  . acetaminophen (TYLENOL) suppository 650 mg  650 mg Rectal Q6H PRN Asencion Noble, MD      . atorvastatin (LIPITOR) tablet 80 mg  80 mg Oral q1800 Rosalin Hawking, MD   80 mg at 08/20/20 1746  . clopidogrel (PLAVIX) tablet 75 mg  75 mg Oral Q breakfast Rosalin Hawking, MD   75 mg at 08/21/20 0814  . furosemide (LASIX) tablet 20 mg  20 mg Oral Daily Asencion Noble, MD   20 mg at 08/21/20 0904  . heparin injection 5,000 Units  5,000 Units Subcutaneous Q8H Asencion Noble, MD   5,000 Units at 08/21/20 0545  . insulin aspart (novoLOG) injection 0-15 Units  0-15 Units Subcutaneous TID WC Asencion Noble, MD  3 Units at 08/21/20 1226  . pantoprazole (PROTONIX) EC tablet 80 mg  80 mg Oral Daily Asencion Noble, MD   80 mg at 08/21/20 5369     Discharge Medications: Please see discharge summary for a list of discharge medications.  Relevant Imaging Results:  Relevant Lab Results:   Additional Information SSN: [id number]  Emeterio Reeve, [place]

## 2020-08-21 NOTE — Progress Notes (Signed)
°  Evaluation after Contrast Extravasation  Patient seen and examined immediately after contrast extravasation by Dr. Achille Rich in [place]. Follow up visit performed by this provider in the patient's room.  Exam: There is moderate swelling at the right upper arm area.  There is no erythema. There is no discoloration. There are 1  Blisters noted. There are no signs of decreased perfusion of the skin.  Right arm is warm to touch.  The patient has full ROM in fingers.  Radial pulse is normal.  Per contrast extravasation protocol, I have instructed the patient and the bedside RN to keep an ice pack on the area for 20-60 minutes at a time for about 48 hours.   Keep arm elevated as much as possible.   The patient understands to call the radiology department if there is: - increase in pain or swelling - changed or altered sensation - ulceration or blistering - increasing redness - warmth or increasing firmness - decreased tissue perfusion as noted by decreased capillary refill or discoloration of skin - decreased pulses peripheral to site   Jacqualine Mau PA-C 08/21/2020 10:41 AM

## 2020-08-21 NOTE — Progress Notes (Signed)
New IV is in place by IV Team on left upper arm. CT notified for CT test. Will continue to assess.

## 2020-08-22 ENCOUNTER — Inpatient Hospital Stay (HOSPITAL_COMMUNITY): Payer: Medicare HMO

## 2020-08-22 DIAGNOSIS — I6389 Other cerebral infarction: Secondary | ICD-10-CM

## 2020-08-22 LAB — CBC
HCT: 36.6 % (ref 36.0–46.0)
Hemoglobin: 11.2 g/dL — ABNORMAL LOW (ref 12.0–15.0)
MCH: 28.4 pg (ref 26.0–34.0)
MCHC: 30.6 g/dL (ref 30.0–36.0)
MCV: 92.9 fL (ref 80.0–100.0)
Platelets: 196 10*3/uL (ref 150–400)
RBC: 3.94 MIL/uL (ref 3.87–5.11)
RDW: 11.7 % (ref 11.5–15.5)
WBC: 4.6 10*3/uL (ref 4.0–10.5)
nRBC: 0 % (ref 0.0–0.2)

## 2020-08-22 LAB — ECHOCARDIOGRAM COMPLETE
Area-P 1/2: 3.19 cm2
Height: 63 in
S' Lateral: 2.7 cm
Single Plane A4C EF: 58.9 %
Weight: 3252.23 oz

## 2020-08-22 LAB — BASIC METABOLIC PANEL
Anion gap: 9 (ref 5–15)
BUN: 9 mg/dL (ref 8–23)
CO2: 26 mmol/L (ref 22–32)
Calcium: 8.5 mg/dL — ABNORMAL LOW (ref 8.9–10.3)
Chloride: 103 mmol/L (ref 98–111)
Creatinine, Ser: 1.46 mg/dL — ABNORMAL HIGH (ref 0.44–1.00)
GFR calc Af Amer: 42 mL/min — ABNORMAL LOW (ref >60)
GFR calc non Af Amer: 37 mL/min — ABNORMAL LOW (ref >60)
Glucose, Bld: 164 mg/dL — ABNORMAL HIGH (ref 70–99)
Potassium: 3.8 mmol/L (ref 3.5–5.1)
Sodium: 138 mmol/L (ref 135–145)

## 2020-08-22 LAB — GLUCOSE, CAPILLARY
Glucose-Capillary: 101 mg/dL — ABNORMAL HIGH (ref 70–99)
Glucose-Capillary: 139 mg/dL — ABNORMAL HIGH (ref 70–99)
Glucose-Capillary: 159 mg/dL — ABNORMAL HIGH (ref 70–99)
Glucose-Capillary: 180 mg/dL — ABNORMAL HIGH (ref 70–99)

## 2020-08-22 NOTE — TOC Progression Note (Addendum)
Transition of Care [facility]) - Progression Note    Patient Details  Name: Deborah Whitehead MRN: [id number] Date of Birth: Jan 15, 1951  [facility]) CM/SW Contact  Sharlet Salina Mila Homer, LCSW Phone Number: 08/22/2020, 1:17 PM  Clinical Narrative: Talked with patient at bedside and her daughter, [name] [phone]) by phone while in the room regarding the patient's discharge disposition. Ms. Grays was advised that [facility] ([facility] preference[facility] did offer, however a second choice would be needed in case [facility] unable to take patient when ready for discharge.   CSW talked with Abigail Butts at [facility] regarding patient. She reported that they don't have a rehab bed today, and she is not sure about tomorrow. Abigail Butts indicated that this information will be passed on to admissions person. Daughter was provided with facility choices by phone, while in room with patient and via text. She was informed that [facility] does not have a bed today, and not sure about tomorrow, and a second choice will be needed. Daughter advised regarding [email or web address].  CSW checked [facility] site) and confirmed that patient is managed by [facility]. CSW will follow-up with [facility] once SNF confirmed to get reference number for SNF placement as [facility] currently unable an authorization waiver.    3:48 pm - Communicated with daughter by text and then phone call this afternoon regarding placement for her mom. Daughter wants [facility] to be the first choice as it is closer to her home. Visited with patient (4:16 pm) and she is in agreement with this.  *Patient will need COVID test prior to discharge.    Expected Discharge Plan: [place] Barriers to Discharge: Continued Medical Work up  Expected Discharge Plan and Services Expected Discharge Plan: [place] arrangements for the past 2 months: Single Family Home                                        Social Determinants of Health (SDOH) Interventions  No SDOH interventions requested or needed at this time.  Readmission Risk Interventions No flowsheet data found.
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
## 2020-08-22 NOTE — Progress Notes (Signed)
Physical Therapy Treatment Patient Details Name: Deborah Whitehead MRN: [id number] DOB: 1951/10/04 Today's Date: 08/22/2020    History of Present Illness The pt is a 69 yo female presenting with worsening weakness an possible syncopa episode. Upon workup, CT of her head showed a possible small acute pontine hemorrhage, awaiting further workup at this time. PMH includes: CVA with L-sided weakness, DM, HTN, and GERD.    PT Comments    Continuing work on functional mobility and activity tolerance;  Session focused on transfer training, and working on anterior weight shifting to initiate; used the stedy with the hope of giving her more confidence to lean forward by pulling up on the bar; improving ability to stand up, performed serial sit<>stands from higher stedy seat height; Overall progressing well; Anticipate continuing good progress at post-acute rehabilitation.   Follow Up Recommendations  SNF;Supervision/Assistance - 24 hour     Equipment Recommendations  Rolling walker with 5" wheels;3in1 (PT)    Recommendations for Other Services       Precautions / Restrictions Precautions Precautions: Fall    Mobility  Bed Mobility Overal bed mobility: Needs Assistance Bed Mobility: Supine to Sit     Supine to sit: Mod assist;HOB elevated     General bed mobility comments: Mod assist to help LEs to EOB, and to elevate trunk to upright  Transfers Overall transfer level: Needs assistance   Transfers: Sit to/from Stand Sit to Stand: Mod assist;Min assist         General transfer comment: Heavy Mod assist to stand from bed slightly elevated to stedy; the bar in front of the stedy seemed to help her confidence with leaning forward to stand; min assist to stand from teh stedy seat height; cues for hand placement; used the stedy to assist her to the recliner  Ambulation/Gait                 Stairs             Wheelchair Mobility    Modified Rankin (Stroke  Patients Only)       Balance     Sitting balance-Leahy Scale: Fair       Standing balance-Leahy Scale: Poor Standing balance comment: reliant on BUE support and +2 mod assist                            Cognition Arousal/Alertness: Awake/alert Behavior During Therapy: Anxious Overall Cognitive Status: Within Functional Limits for tasks assessed                                 General Comments: very fearful of falling      Exercises      General Comments        Pertinent Vitals/Pain Pain Assessment: No/denies pain    Home Living                      Prior Function            PT Goals (current goals can now be found in the care plan section) Acute Rehab PT Goals Patient Stated Goal: improve walking, not to fall again PT Goal Formulation: With patient Time For Goal Achievement: 09/03/20 Potential to Achieve Goals: Good Progress towards PT goals: Progressing toward goals    Frequency    Min 4X/week      PT Plan Current plan  remains appropriate    Co-evaluation              AM-PAC PT "6 Clicks" Mobility   Outcome Measure  Help needed turning from your back to your side while in a flat bed without using bedrails?: A Little Help needed moving from lying on your back to sitting on the side of a flat bed without using bedrails?: A Lot Help needed moving to and from a bed to a chair (including a wheelchair)?: A Lot Help needed standing up from a chair using your arms (e.g., wheelchair or bedside chair)?: A Lot Help needed to walk in hospital room?: A Lot Help needed climbing 3-5 steps with a railing? : Total 6 Click Score: 12    End of Session Equipment Utilized During Treatment: Gait belt Activity Tolerance: Patient tolerated treatment well Patient left: in chair;with chair alarm set;with call bell/phone within reach Nurse Communication: Mobility status PT Visit Diagnosis: Muscle weakness (generalized)  (M62.81);Difficulty in walking, not elsewhere classified (R26.2);Hemiplegia and hemiparesis Hemiplegia - Right/Left: Left Hemiplegia - dominant/non-dominant: Non-dominant Hemiplegia - caused by:  (prior CVA)     Time: 1040-1100 PT Time Calculation (min) (ACUTE ONLY): 20 min  Charges:  $Therapeutic Activity: 8-22 mins                     Roney Marion, PT  Acute Rehabilitation Services Pager [phone] Office [place] 08/22/2020, 4:55 PM

## 2020-08-22 NOTE — Progress Notes (Signed)
° °  Subjective:  Ms. Jaquess reports feeling "good" this morning. She was alert and eating breakfast during the interview. She reports no episodes of weakness/palpitations overnight. She is having no pain or shortness of breath today.  She is less anxious this morning, which she attributes to Ativan, though she hasnt had a dose of ativan in three days. She endorses some dizziness both at rest and with activity. We discussed the results of her CTA neck and also discussed the fact that we are still waiting for her echocardiogram because of our concern for valvular pathology. She voiced understanding of this plan.  We discussed our dispo plan for SNF, and she agrees that she would benefit from further therapy for strength and mobility.  Objective:  Vital signs in last 24 hours: Vitals:   08/21/20 1736 08/21/20 2105 08/21/20 2105 08/22/20 0500  BP: 135/83 (!) 146/83 (!) 146/83   Pulse: 78 86 86   Resp: 20 20    Temp: 98.5 F (36.9 C) 98.3 F (36.8 C) 98.3 F (36.8 C)   TempSrc: Oral Oral Oral   SpO2: 97% 96% 96%   Weight:   92.2 kg 92.2 kg  Height:       Weight change: 0 kg  Intake/Output Summary (Last 24 hours) at 08/22/2020 0841 Last data filed at 08/22/2020 0600 Gross per 24 hour  Intake 360 ml  Output 1900 ml  Net -1540 ml   Physical Exam Constitutional:      General: She is not in acute distress. Cardiovascular:     Rate and Rhythm: Normal rate and regular rhythm.     Heart sounds: Normal heart sounds.  Pulmonary:     Effort: Pulmonary effort is normal.     Breath sounds: Normal breath sounds. No wheezing.  Chest:     Chest wall: No tenderness.  Skin:    General: Skin is warm and dry.  Neurological:     Mental Status: She is alert. Mental status is at baseline.  Psychiatric:        Mood and Affect: Mood normal.        Thought Content: Thought content normal.        Judgment: Judgment normal.     Assessment/Plan:  Deconditioning/ vs. Weakness and DOE: MRI/MRA  brain negative for stroke. CTA neck negative for occlusion or significant stenosis. UTox negative. TSH nl. CBGs with no hypoglycemic readings. Orthostatic vitals positive for orthostatic hypotension.  - Echo today. If negative for significant valvular pathology, it is likely that her symptoms could be attributable to orthostatic hypotension vs. Deconditioning vs. Hypoglycemic events. - Plan for discharge to SNF for further therapy directed toward strength and mobility. Patient amenable to this plan  3/6 Systolic Murmur:  3/6 crescendo/decrescentdo systolic murmur heard best over R sternal border, consistent with aortic stenosis. 2015 echo with moderate thickening of aortic valve. Significant valvular disease could be the underlying cause of her symptoms. - Echo today.   Anxiety: Patient more comfortable today. - Continue to monitor  DM2: Patient has been taking home metformin, but not monitoring her sugars. Controlled with SSI in hospital with no aberrant CBGs.  - Continue with SSI and regular CBGs  Prior to Admission Living Arrangement: Home Anticipated Discharge Location: SNF Barriers to Discharge: ECHO Dispo: Anticipated discharge in approximately 0-1 day.   LOS: 3 days   Pearla Dubonnet, Medical Student 08/22/2020, 8:41 AM

## 2020-08-22 NOTE — Evaluation (Signed)
Occupational Therapy Evaluation Patient Details Name: Deborah Whitehead MRN: [id number] DOB: 08-Dec-1951 Today's Date: 08/22/2020    History of Present Illness The pt is a 69 yo female presenting with worsening weakness an possible syncopa episode. Upon workup, CT of her head showed a possible small acute pontine hemorrhage, awaiting further workup at this time. PMH includes: CVA with L-sided weakness, DM, HTN, and GERD.   Clinical Impression   Pt was assisted for all ADL with exception of some grooming activities and feeding. She presents with high anxiety level with fear of falling, generalized weakness and poor standing balance. She requires set up total assist for ADL. Pt typically does walk to the bathroom with RW and supervision of her aide or daughter. Will follow to address this area. Recommending SNF for further rehab.    Follow Up Recommendations  SNF;Supervision/Assistance - 24 hour    Equipment Recommendations  3 in 1 bedside commode    Recommendations for Other Services       Precautions / Restrictions Precautions Precautions: Fall      Mobility Bed Mobility               General bed mobility comments: pt received and returned to chair  Transfers Overall transfer level: Needs assistance Equipment used: Rolling walker (2 wheeled) Transfers: Sit to/from Stand Sit to Stand: +2 physical assistance;Mod assist         General transfer comment: cues for technique, use of momentum, assist to rise and steady, pt limited by significant fear of falling    Balance Overall balance assessment: Needs assistance   Sitting balance-Leahy Scale: Fair Sitting balance - Comments: at edge of chair   Standing balance support: Bilateral upper extremity supported;During functional activity Standing balance-Leahy Scale: Poor Standing balance comment: reliant on BUE support and +2 mod assist                           ADL either performed or assessed with  clinical judgement   ADL Overall ADL's : At baseline                                       General ADL Comments: Pt can self feed and groom, she is otherwise dependent in bathing, dressing and pericare, but she does ambulate to the bathroom with RW and supervision.      Vision Patient Visual Report: No change from baseline       Perception     Praxis      Pertinent Vitals/Pain Pain Assessment: No/denies pain     Hand Dominance Right   Extremity/Trunk Assessment Upper Extremity Assessment Upper Extremity Assessment: Generalized weakness (L slightly weaker from previous CVA)   Lower Extremity Assessment Lower Extremity Assessment: Defer to PT evaluation   Cervical / Trunk Assessment Cervical / Trunk Assessment: Kyphotic (obese)   Communication Communication Communication: No difficulties   Cognition Arousal/Alertness: Awake/alert Behavior During Therapy: Anxious Overall Cognitive Status: Within Functional Limits for tasks assessed                                 General Comments: wanting to return to bed because she feels safer   General Comments       Exercises     Shoulder Instructions      Home Living  Family/patient expects to be discharged to:: Private residence Living Arrangements: Children Available Help at Discharge: Family;Home health;Available 24 hours/day (HHA 5 days a week) Type of Home: House Home Access: Level entry     Home Layout: One level     Bathroom Shower/Tub: Other (comment) (pt sponge bathes)   Bathroom Toilet: Standard     Home Equipment: Walker - 2 wheels          Prior Functioning/Environment Level of Independence: Needs assistance  Gait / Transfers Assistance Needed: mobilize with RW, previously independent, now needs supervision from aide or daughter ADL's / Homemaking Assistance Needed: daughter completes IADLs and homemaking. aide 4 hrs/day for 5 days/week for dressing and bathing and  toileting            OT Problem List: Decreased strength;Impaired balance (sitting and/or standing);Decreased activity tolerance;Decreased knowledge of use of DME or AE      OT Treatment/Interventions: Self-care/ADL training;DME and/or AE instruction;Balance training    OT Goals(Current goals can be found in the care plan section) Acute Rehab OT Goals Patient Stated Goal: improve walking, not to fall again OT Goal Formulation: With patient Time For Goal Achievement: 09/05/20 Potential to Achieve Goals: Good ADL Goals Pt Will Transfer to Toilet: with min assist;ambulating;bedside commode  OT Frequency: Min 1X/week   Barriers to D/C:            Co-evaluation              AM-PAC OT "6 Clicks" Daily Activity     Outcome Measure Help from another person eating meals?: None Help from another person taking care of personal grooming?: A Little Help from another person toileting, which includes using toliet, bedpan, or urinal?: Total Help from another person bathing (including washing, rinsing, drying)?: Total Help from another person to put on and taking off regular upper body clothing?: A Lot Help from another person to put on and taking off regular lower body clothing?: Total 6 Click Score: 12   End of Session Equipment Utilized During Treatment: Rolling walker;Gait belt Nurse Communication: Mobility status  Activity Tolerance: Other (comment) (limited by anxiety) Patient left: in chair;with call bell/phone within reach;with chair alarm set  OT Visit Diagnosis: Unsteadiness on feet (R26.81)                Time: 4967-5916 OT Time Calculation (min): 14 min Charges:  OT General Charges $OT Visit: 1 Visit OT Evaluation $OT Eval Moderate Complexity: 1 Mod  Nestor Lewandowsky, OTR/L Acute Rehabilitation Services Pager: [phone] Office: [phone]  Malka So 08/22/2020, 12:45 PM

## 2020-08-22 NOTE — Progress Notes (Signed)
  Echocardiogram 2D Echocardiogram has been performed.  Merrie Roof F 08/22/2020, 1:14 PM

## 2020-08-22 NOTE — Progress Notes (Signed)
Pt transferred back to bed with steady.

## 2020-08-23 ENCOUNTER — Inpatient Hospital Stay: Payer: Medicare HMO

## 2020-08-23 DIAGNOSIS — R531 Weakness: Secondary | ICD-10-CM | POA: Diagnosis not present

## 2020-08-23 DIAGNOSIS — F411 Generalized anxiety disorder: Secondary | ICD-10-CM | POA: Diagnosis not present

## 2020-08-23 DIAGNOSIS — E119 Type 2 diabetes mellitus without complications: Secondary | ICD-10-CM | POA: Diagnosis not present

## 2020-08-23 DIAGNOSIS — E785 Hyperlipidemia, unspecified: Secondary | ICD-10-CM | POA: Diagnosis not present

## 2020-08-23 DIAGNOSIS — I629 Nontraumatic intracranial hemorrhage, unspecified: Secondary | ICD-10-CM | POA: Diagnosis not present

## 2020-08-23 DIAGNOSIS — I10 Essential (primary) hypertension: Secondary | ICD-10-CM | POA: Diagnosis not present

## 2020-08-23 DIAGNOSIS — E169 Disorder of pancreatic internal secretion, unspecified: Secondary | ICD-10-CM | POA: Diagnosis not present

## 2020-08-23 DIAGNOSIS — E1122 Type 2 diabetes mellitus with diabetic chronic kidney disease: Secondary | ICD-10-CM | POA: Diagnosis not present

## 2020-08-23 DIAGNOSIS — M6281 Muscle weakness (generalized): Secondary | ICD-10-CM | POA: Diagnosis not present

## 2020-08-23 DIAGNOSIS — Z7401 Bed confinement status: Secondary | ICD-10-CM | POA: Diagnosis not present

## 2020-08-23 DIAGNOSIS — M6259 Muscle wasting and atrophy, not elsewhere classified, multiple sites: Secondary | ICD-10-CM | POA: Diagnosis not present

## 2020-08-23 DIAGNOSIS — M2550 Pain in unspecified joint: Secondary | ICD-10-CM | POA: Diagnosis not present

## 2020-08-23 DIAGNOSIS — F29 Unspecified psychosis not due to a substance or known physiological condition: Secondary | ICD-10-CM | POA: Diagnosis not present

## 2020-08-23 DIAGNOSIS — Z20828 Contact with and (suspected) exposure to other viral communicable diseases: Secondary | ICD-10-CM | POA: Diagnosis not present

## 2020-08-23 DIAGNOSIS — R2681 Unsteadiness on feet: Secondary | ICD-10-CM | POA: Diagnosis not present

## 2020-08-23 DIAGNOSIS — N179 Acute kidney failure, unspecified: Secondary | ICD-10-CM | POA: Diagnosis not present

## 2020-08-23 DIAGNOSIS — F329 Major depressive disorder, single episode, unspecified: Secondary | ICD-10-CM | POA: Diagnosis not present

## 2020-08-23 DIAGNOSIS — F339 Major depressive disorder, recurrent, unspecified: Secondary | ICD-10-CM | POA: Diagnosis not present

## 2020-08-23 DIAGNOSIS — R2689 Other abnormalities of gait and mobility: Secondary | ICD-10-CM | POA: Diagnosis not present

## 2020-08-23 DIAGNOSIS — I499 Cardiac arrhythmia, unspecified: Secondary | ICD-10-CM | POA: Diagnosis not present

## 2020-08-23 DIAGNOSIS — R918 Other nonspecific abnormal finding of lung field: Secondary | ICD-10-CM | POA: Diagnosis not present

## 2020-08-23 DIAGNOSIS — G939 Disorder of brain, unspecified: Secondary | ICD-10-CM | POA: Diagnosis not present

## 2020-08-23 DIAGNOSIS — Z23 Encounter for immunization: Secondary | ICD-10-CM

## 2020-08-23 DIAGNOSIS — N183 Chronic kidney disease, stage 3 unspecified: Secondary | ICD-10-CM | POA: Diagnosis not present

## 2020-08-23 DIAGNOSIS — G8190 Hemiplegia, unspecified affecting unspecified side: Secondary | ICD-10-CM | POA: Diagnosis not present

## 2020-08-23 DIAGNOSIS — I639 Cerebral infarction, unspecified: Secondary | ICD-10-CM | POA: Diagnosis not present

## 2020-08-23 DIAGNOSIS — F1910 Other psychoactive substance abuse, uncomplicated: Secondary | ICD-10-CM | POA: Diagnosis not present

## 2020-08-23 DIAGNOSIS — R5381 Other malaise: Secondary | ICD-10-CM | POA: Diagnosis not present

## 2020-08-23 DIAGNOSIS — I69359 Hemiplegia and hemiparesis following cerebral infarction affecting unspecified side: Secondary | ICD-10-CM | POA: Diagnosis not present

## 2020-08-23 DIAGNOSIS — F419 Anxiety disorder, unspecified: Secondary | ICD-10-CM | POA: Diagnosis not present

## 2020-08-23 LAB — CBC
HCT: 38.1 % (ref 36.0–46.0)
Hemoglobin: 11.5 g/dL — ABNORMAL LOW (ref 12.0–15.0)
MCH: 28.5 pg (ref 26.0–34.0)
MCHC: 30.2 g/dL (ref 30.0–36.0)
MCV: 94.5 fL (ref 80.0–100.0)
Platelets: 199 10*3/uL (ref 150–400)
RBC: 4.03 MIL/uL (ref 3.87–5.11)
RDW: 11.6 % (ref 11.5–15.5)
WBC: 5 10*3/uL (ref 4.0–10.5)
nRBC: 0 % (ref 0.0–0.2)

## 2020-08-23 LAB — SARS CORONAVIRUS 2 BY RT PCR (HOSPITAL ORDER, PERFORMED IN ~~LOC~~ HOSPITAL LAB): SARS Coronavirus 2: NEGATIVE

## 2020-08-23 LAB — BASIC METABOLIC PANEL
Anion gap: 8 (ref 5–15)
BUN: 12 mg/dL (ref 8–23)
CO2: 28 mmol/L (ref 22–32)
Calcium: 8.7 mg/dL — ABNORMAL LOW (ref 8.9–10.3)
Chloride: 102 mmol/L (ref 98–111)
Creatinine, Ser: 1.44 mg/dL — ABNORMAL HIGH (ref 0.44–1.00)
GFR calc Af Amer: 43 mL/min — ABNORMAL LOW (ref >60)
GFR calc non Af Amer: 37 mL/min — ABNORMAL LOW (ref >60)
Glucose, Bld: 158 mg/dL — ABNORMAL HIGH (ref 70–99)
Potassium: 4 mmol/L (ref 3.5–5.1)
Sodium: 138 mmol/L (ref 135–145)

## 2020-08-23 LAB — GLUCOSE, CAPILLARY
Glucose-Capillary: 126 mg/dL — ABNORMAL HIGH (ref 70–99)
Glucose-Capillary: 137 mg/dL — ABNORMAL HIGH (ref 70–99)
Glucose-Capillary: 170 mg/dL — ABNORMAL HIGH (ref 70–99)

## 2020-08-23 MED ORDER — ATORVASTATIN CALCIUM 80 MG PO TABS
80.0000 mg | ORAL_TABLET | Freq: Every day | ORAL | 0 refills | Status: AC
Start: 2020-08-23 — End: ?

## 2020-08-23 NOTE — Progress Notes (Signed)
Called [name] nursing facility to give report. Received voicemail.  Lecil Tapp, RN

## 2020-08-23 NOTE — Progress Notes (Addendum)
DISCHARGE NOTE SNF [facility] to be discharged [facility] per MD order. Patient verbalized understanding.  Skin clean, dry and intact without evidence of skin break down, no evidence of skin tears noted. IV catheter discontinued intact. Site without signs and symptoms of complications. Dressing and pressure applied. Pt denies pain at the site currently. No complaints noted.  Patient free of lines, drains, and wounds.   Discharge packet assembled. An After Visit Summary (AVS) was printed and given to the EMS personnel. Patient escorted via stretcher and discharged to [facility] via ambulance. Report called to accepting facility; all questions and concerns addressed.   Rushie Goltz, RN

## 2020-08-23 NOTE — Progress Notes (Signed)
RN called [facility] nursing facility to give report, no one answered. Left a message, with our number, on the voicemail.  Rushie Goltz, RN

## 2020-08-23 NOTE — TOC Transition Note (Signed)
Transition of Care [facility]) - CM/SW Discharge Note   Patient Details  Name: Deborah Whitehead MRN: [id number] Date of Birth: [birth year]  [facility]) CM/SW Contact:  Marguerita Merles, [place] Work Phone Number: 08/23/2020, 4:32 PM   Clinical Narrative:   Nurse to call report to [phone]     Final next level of care: [place] Barriers to Discharge: Barriers Resolved   Patient Goals and CMS Choice Patient states their goals for this hospitalization and ongoing recovery are:: Go to rehab then return home with daughter CMS Medicare.gov Compare Post Acute Care list provided to:: Patient Choice offered to / list presented to : Patient, Adult Children  Discharge Placement              Patient chooses bed at: [facility] Patient to be transferred to facility by: [place] Name of family member notified: [place] Patient and family notified of of transfer: 08/23/20  Discharge Plan and Services                                     Social Determinants of Health (SDOH) Interventions     Readmission Risk Interventions No flowsheet data found.

## 2020-08-23 NOTE — Progress Notes (Signed)
   Subjective:  Deborah Whitehead states that she feels good this morning and feels up to being discharged to SNF. She has no complaints or questions this morning. Results of her echo discussed. Awaiting COVID vaccine this am.   Objective:  Vital signs in last 24 hours: Vitals:   08/22/20 1635 08/22/20 2108 08/23/20 0439 08/23/20 0916  BP: 124/89 132/89 (!) 146/74 (!) 152/84  Pulse: 79 81 76 75  Resp: 16 15 16 18   Temp: 98.9 F (37.2 C) 97.7 F (36.5 C) 99 F (37.2 C) 98.6 F (37 C)  TempSrc: Oral Oral Oral Oral  SpO2: 96% 98% 99% 96%  Weight:      Height:       Weight change:   Intake/Output Summary (Last 24 hours) at 08/23/2020 1119 Last data filed at 08/23/2020 0900 Gross per 24 hour  Intake 4087.19 ml  Output 4750 ml  Net -662.81 ml   Physical Exam Constitutional:      General: She is not in acute distress. Cardiovascular:     Rate and Rhythm: Normal rate and regular rhythm.     Heart sounds: Murmur (2/6 systolic murmur heard best at R upper sternal border) heard.   Pulmonary:     Effort: Pulmonary effort is normal.     Breath sounds: Normal breath sounds. No wheezing, rhonchi or rales.  Abdominal:     General: There is no distension.     Palpations: Abdomen is soft.     Tenderness: There is no abdominal tenderness.  Neurological:     Mental Status: She is alert. Mental status is at baseline.  Psychiatric:        Mood and Affect: Mood normal.        Thought Content: Thought content normal.        Judgment: Judgment normal.    Assessment/Plan:  Orthostatic hypotension/Deconditioning: Her weakness is likely due to the combined effects of orthostatic hypotension and deconditioning. Agree with PT/OT that she would benefit from SNF rehab for strength and balance.  - TOC coordinating SNF placement. - Medically stable for discharge. Hopeful for discharge today.   Systolic Murmur:  Echo without valvular pathology. Murmur could be physiologic given her age and enhanced  by her slightly low hemoglobin (11.5 g/dL today). - Monitor on outpatient basis   Seeking vaccination against COVID-19: - Inpatient vaccination team to vaccinate her prior to discharge.    LOS: 4 days   Deborah Whitehead, Medical Student 08/23/2020, 11:19 AM

## 2020-08-23 NOTE — Progress Notes (Signed)
   Covid-19 Vaccination Clinic  Name:  Deborah Whitehead    MRN: [id number] DOB: February 16, 1951  08/23/2020  [name] was observed post Covid-19 immunization for 15 minutes without incident. She was provided with Vaccine Information Sheet and instruction to access the V-Safe system.   [name] was instructed to call 911 with any severe reactions post vaccine: Marland Kitchen Difficulty breathing  . Swelling of face and throat  . A fast heartbeat  . A bad rash all over body  . Dizziness and weakness   Immunizations Administered    Name Date Dose VIS Date Route   Pfizer COVID-19 Vaccine 08/23/2020 11:57 AM 0.3 mL 02/10/2019 Intramuscular   Manufacturer: [place]   Lot: [id number]   [place]: [id number]

## 2020-08-23 NOTE — Care Management Important Message (Signed)
Important Message  Patient Details  Name: Deborah Whitehead MRN: [id number] Date of Birth: 1951-05-29   Medicare Important Message Given:  Yes - Important Message mailed due to current National Emergency  Verbal consent obtained due to current National Emergency  Relationship to patient: Self Contact Name: Keyandra Swenson Call Date: 08/23/20  Time: 1432 Phone: [phone] Outcome: No Answer/Busy Important Message mailed to: Patient address on file    Delorse Lek 08/23/2020, 2:32 PM

## 2020-08-23 NOTE — Progress Notes (Signed)
Called [facility] facility to give report. Received facilty voicemail.   Jamison Soward, RN

## 2020-08-24 DIAGNOSIS — F419 Anxiety disorder, unspecified: Secondary | ICD-10-CM | POA: Diagnosis not present

## 2020-08-24 DIAGNOSIS — I629 Nontraumatic intracranial hemorrhage, unspecified: Secondary | ICD-10-CM | POA: Diagnosis not present

## 2020-08-24 DIAGNOSIS — F1910 Other psychoactive substance abuse, uncomplicated: Secondary | ICD-10-CM | POA: Diagnosis not present

## 2020-08-24 DIAGNOSIS — R918 Other nonspecific abnormal finding of lung field: Secondary | ICD-10-CM | POA: Diagnosis not present

## 2020-08-24 DIAGNOSIS — E119 Type 2 diabetes mellitus without complications: Secondary | ICD-10-CM | POA: Diagnosis not present

## 2020-08-24 DIAGNOSIS — I639 Cerebral infarction, unspecified: Secondary | ICD-10-CM | POA: Diagnosis not present

## 2020-08-24 DIAGNOSIS — G8190 Hemiplegia, unspecified affecting unspecified side: Secondary | ICD-10-CM | POA: Diagnosis not present

## 2020-08-24 DIAGNOSIS — F329 Major depressive disorder, single episode, unspecified: Secondary | ICD-10-CM | POA: Diagnosis not present

## 2020-08-25 ENCOUNTER — Other Ambulatory Visit: Payer: Self-pay

## 2020-08-25 NOTE — Patient Outreach (Signed)
[place] [facility]) Care Management  08/25/2020  Deborah Whitehead [birth year] [id number]   Referral Date: 08/25/20 Referral Source: [employer] Report Date of Discharge: 08/23/20 Facility:  [place]: [facility]   Referral received.  No outreach warranted at this time.  Transition of Care calls being completed via EMMI. RN CM will outreach patient for any red flags received.    Plan: RN CM will close case.    Jone Baseman, RN, MSN [facility] Care Management Care Management Coordinator Direct Line [phone] Toll Free: [phone]  Fax: [phone]

## 2020-08-26 DIAGNOSIS — R531 Weakness: Secondary | ICD-10-CM | POA: Diagnosis not present

## 2020-08-26 DIAGNOSIS — F411 Generalized anxiety disorder: Secondary | ICD-10-CM | POA: Diagnosis not present

## 2020-08-26 DIAGNOSIS — N179 Acute kidney failure, unspecified: Secondary | ICD-10-CM | POA: Diagnosis not present

## 2020-08-26 DIAGNOSIS — N183 Chronic kidney disease, stage 3 unspecified: Secondary | ICD-10-CM | POA: Diagnosis not present

## 2020-08-26 DIAGNOSIS — I10 Essential (primary) hypertension: Secondary | ICD-10-CM | POA: Diagnosis not present

## 2020-08-26 DIAGNOSIS — I69359 Hemiplegia and hemiparesis following cerebral infarction affecting unspecified side: Secondary | ICD-10-CM | POA: Diagnosis not present

## 2020-08-26 DIAGNOSIS — E1122 Type 2 diabetes mellitus with diabetic chronic kidney disease: Secondary | ICD-10-CM | POA: Diagnosis not present

## 2020-09-16 ENCOUNTER — Other Ambulatory Visit: Payer: Self-pay

## 2020-09-22 ENCOUNTER — Inpatient Hospital Stay: Payer: Medicare Other | Admitting: Adult Health

## 2020-09-22 ENCOUNTER — Encounter: Payer: Self-pay | Admitting: Adult Health

## 2020-09-22 NOTE — Progress Notes (Deleted)
[facility] [address] [place]. [place] [address] [phone]       HOSPITAL FOLLOW UP NOTE  [name] Date of Birth:  11/25/1951 Medical Record Number:  [id number]   Reason for Referral:  hospital stroke follow up    SUBJECTIVE:   CHIEF COMPLAINT:  No chief complaint on file.   HPI:   [name] is a 69 y.o. female with history of substance abuse, DM, stroke, dyspnea, s/p loop recorder implant,  gait difficulties (ambulates with a walker) , and hypertension  who presented on 08/18/2020 with left lower extremity weakness.   Personally reviewed hospitalization pertinent progress notes, lab work and imaging with summary provided.  Evaluated by stroke team and Dr. Erlinda Hong for stroke work-up negative for acute infarct and likely recrudescence of old stroke symptoms in setting of mild UTI.  MRA showed severe atherosclerotic disease and right P3 segment occluded.  Right thalamic, right MCA/ACA and right occipital infarcts 2014 and right MCA/ACA and MCA/PCA 2015 s/p loop recorder with noncompliance with loop recorder monitoring with very limited data on follow-up.  Recommending continuation Plavix for secondary stroke prevention.  HTN stable.  LDL 164 and increase atorvastatin from 40 mg to 80 mg daily.  Uncontrolled DM with A1c 7.8.  Other stroke risk factors include advanced age, former tobacco use, obesity and history of substance abuse.  Residual deficits of left hemiparesis and discharged to SNF in stable condition.  Recrudescence of old stroke in the setting of mild UTI   CT head - Tiny 0.3 cm hyperdense focus in the central pons, not previously seen. This could represent a tiny focus of acute hemorrhage.  MRI head - Negative for acute infarct. Chronic infarct over the right convexity extending into the right occipital lobe. Possible watershed chronic infarct.  MRA head - Severe intracranial atherosclerotic disease. Right P3 segment  occluded.  CTA Neck - No emergent large vessel occlusion or high-grade stenosis of the carotid or vertebral arteries.  Lower Extremity Venous Dopplers no DVT  2D Echo - pending  Sars Corona Virus 2 - negative  LDL - 164  HgbA1c - 7.8  UDS - negative  VTE prophylaxis - [place] Heparin  clopidogrel 75 mg daily prior to admission, now on clopidogrel 75 mg daily. Continue on discharge  Patient counseled to be compliant with her antithrombotic medications  Ongoing aggressive stroke risk factor management  Therapy recommendations:  SNF   Disposition:  Pending     ROS:   14 system review of systems performed and negative with exception of ***  PMH:  Past Medical History:  Diagnosis Date  . Anxiety   . Arthritis   . Depression   . Fibroids    abd  . GERD (gastroesophageal reflux disease)   . Hypertension    dr Ayesha Rumpf     immanuel fp  . Shortness of breath   . Stroke [facility])    memory loss  . Substance abuse ([place])     PSH:  Past Surgical History:  Procedure Laterality Date  . ABDOMINAL HYSTERECTOMY    . CESAREAN SECTION    . CHOLECYSTECTOMY    . INSERTION OF MESH N/A 06/25/2013   Procedure: INSERTION OF MESH;  Surgeon: Merrie Roof, MD;  Location: [facility];  Service: General;  Laterality: N/A;  . 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 LOOP RECORDER IMPLANT N/A 03/26/2014   Procedure: LOOP  RECORDER IMPLANT;  Surgeon: Deboraha Sprang, MD;  Location: [facility] CATH LAB;  Service: Cardiovascular;  Laterality: N/A;  . TEE WITHOUT CARDIOVERSION N/A 03/25/2014   Procedure: TRANSESOPHAGEAL ECHOCARDIOGRAM (TEE);  Surgeon: Dorothy Spark, MD;  Location: [place];  Service: Cardiovascular;  Laterality: N/A;  . VENTRAL HERNIA REPAIR N/A 06/25/2013   Procedure: LAPAROSCOPIC VENTRAL HERNIA;  Surgeon: Merrie Roof, MD;  Location: [facility] OR;  Service: General;  Laterality: N/A;    Social History:  Social History   Socioeconomic  History  . Marital status: Widowed    Spouse name: Not on file  . Number of children: Not on file  . Years of education: Not on file  . Highest education level: Not on file  Occupational History  . Not on file  Tobacco Use  . Smoking status: Former Smoker    Packs/day: 0.25    Years: 15.00    Pack years: 3.75    Quit date: 06/18/2013    Years since quitting: 7.2  . Smokeless tobacco: Never Used  Substance and Sexual Activity  . Alcohol use: No    Comment: stopped  . Drug use: Yes    Types: Cocaine    Comment: stopped x 1 yr  . Sexual activity: Not on file  Other Topics Concern  . Not on file  Social History Narrative  . Not on file   Social Determinants of Health   Financial Resource Strain:   . Difficulty of Paying Living Expenses: Not on file  Food Insecurity:   . Worried About Charity fundraiser in the Last Year: Not on file  . Ran Out of Food in the Last Year: Not on file  Transportation Needs:   . Lack of Transportation (Medical): Not on file  . Lack of Transportation (Non-Medical): Not on file  Physical Activity:   . Days of Exercise per Week: Not on file  . Minutes of Exercise per Session: Not on file  Stress:   . Feeling of Stress : Not on file  Social Connections:   . Frequency of Communication with Friends and Family: Not on file  . Frequency of Social Gatherings with Friends and Family: Not on file  . Attends Religious Services: Not on file  . Active Member of Clubs or Organizations: Not on file  . Attends Archivist Meetings: Not on file  . Marital Status: Not on file  Intimate Partner Violence:   . Fear of Current or Ex-Partner: Not on file  . Emotionally Abused: Not on file  . Physically Abused: Not on file  . Sexually Abused: Not on file    Family History:  Family History  Problem Relation Age of Onset  . Cancer Mother        Breast  . Hypertension Mother   . Diabetes Mother   . Hyperlipidemia Mother   . Thyroid disease Mother    . Cancer Father        Prostate  . Asthma Brother     Medications:   Current Outpatient Medications on File Prior to Visit  Medication Sig Dispense Refill  . ALPRAZolam (XANAX) 0.25 MG tablet Take 1 tablet (0.25 mg total) by mouth 3 (three) times daily as needed for sleep or anxiety. (Patient not taking: Reported on 08/19/2020) 30 tablet 0  . amLODipine (NORVASC) 5 MG tablet Take 5 mg by mouth daily.    Marland Kitchen atorvastatin (LIPITOR) 80 MG tablet Take 1 tablet (80 mg total) by mouth daily  at 6 PM. 30 tablet 0  . clopidogrel (PLAVIX) 75 MG tablet Take 1 tablet (75 mg total) by mouth daily with breakfast. (Patient not taking: Reported on 08/19/2020) 30 tablet 1  . clotrimazole-betamethasone (LOTRISONE) cream Apply 1 application topically daily as needed.    . furosemide (LASIX) 20 MG tablet Take 20 mg by mouth daily.    Marland Kitchen ibuprofen (ADVIL,MOTRIN) 200 MG tablet Take 200 mg by mouth every 6 (six) hours as needed for moderate pain.    Marland Kitchen losartan (COZAAR) 100 MG tablet Take 100 mg by mouth daily.    . metFORMIN (GLUCOPHAGE-XR) 500 MG 24 hr tablet Take 500 mg by mouth daily.    . Multiple Vitamin (MULTIVITAMIN) capsule Take 1 capsule by mouth daily.    Marland Kitchen omeprazole (PRILOSEC) 40 MG capsule Take 40 mg by mouth daily.     No current facility-administered medications on file prior to visit.    Allergies:  No Known Allergies    OBJECTIVE:  Physical Exam  There were no vitals filed for this visit. There is no height or weight on file to calculate BMI. No exam data present  No flowsheet data found.   General: well developed, well nourished, seated, in no evident distress Head: head normocephalic and atraumatic.   Neck: supple with no carotid or supraclavicular bruits Cardiovascular: regular rate and rhythm, no murmurs Musculoskeletal: no deformity Skin:  no rash/petichiae Vascular:  Normal pulses all extremities   Neurologic Exam Mental Status: Awake and fully alert. Oriented to place and  time. Recent and remote memory intact. Attention span, concentration and fund of knowledge appropriate. Mood and affect appropriate.  Cranial Nerves: Fundoscopic exam reveals sharp disc margins. Pupils equal, briskly reactive to light. Extraocular movements full without nystagmus. Visual fields full to confrontation. Hearing intact. Facial sensation intact. Face, tongue, palate moves normally and symmetrically.  Motor: Normal bulk and tone. Normal strength in all tested extremity muscles. Sensory.: intact to touch , pinprick , position and vibratory sensation.  Coordination: Rapid alternating movements normal in all extremities. Finger-to-nose and heel-to-shin performed accurately bilaterally. Gait and Station: Arises from chair without difficulty. Stance is normal. Gait demonstrates normal stride length and balance Reflexes: 1+ and symmetric. Toes downgoing.     NIHSS  *** Modified Rankin  ***     ASSESSMENT/PLAN: Deborah Whitehead is a 69 y.o. year old female presented with left lower extremity weakness on 08/18/2020 without evidence of acute infarct and likely recrudescence of old stroke symptoms in setting of mild UTI.  Vascular risk factors include HTN, HLD, DM, history of prior strokes (2014: R thalamic, right MCA/ACA and right occipital and 2015: Right MCA/ACA and MCA/PCA infarcts), diffuse intracranial arthrosclerosis, advanced age, former tobacco use, obesity and history of substance abuse.     1. History of multiple strokes:  a. Recrudescence of prior stroke symptoms in setting of UTI with residual deficit: ***.  b. Continue clopidogrel 75 mg daily  and atorvastatin 80 mg daily for secondary stroke prevention.  c. Discussed secondary stroke prevention measures and importance of close PCP follow up for aggressive stroke risk factor management  2. HTN: BP goal <130/90. On amlodipine, furosemide and losartan per PCP 3. HLD: LDL goal <70. Recent LDL 164.  Continue increased dose of  atorvastatin 80 mg daily.  Ensure follow-up with PCP for ongoing prescribing and repeat lipid panel 4. DMII: A1c goal<7.0. Recent A1c 7.8.  On Metformin per PCP    Follow up in *** or call earlier if  needed   I spent *** minutes of face-to-face and non-face-to-face time with patient.  This included previsit chart review, lab review, study review, order entry, electronic health record documentation, patient education regarding recent stroke, residual deficits, importance of managing stroke risk factors and answered all questions to patient satisfaction     Frann Rider, [facility]  [facility] [address] [place], [place] [address]  Phone [phone] Fax [phone] Note: This document was prepared with digital dictation and possible smart phrase technology. Any transcriptional errors that result from this process are unintentional.

## 2020-10-17 ENCOUNTER — Encounter: Payer: Self-pay | Admitting: Adult Health

## 2020-10-17 ENCOUNTER — Inpatient Hospital Stay: Payer: Medicare Other | Admitting: Adult Health

## 2020-10-17 NOTE — Progress Notes (Deleted)
[facility] [address] [place]. [place] [address] [phone]       HOSPITAL FOLLOW UP NOTE  [name] Date of Birth:  04/12/1951 Medical Record Number:  [id number]   Reason for Referral:  hospital stroke follow up    SUBJECTIVE:   CHIEF COMPLAINT:  No chief complaint on file.   HPI:   [name] is a 69 y.o. female with history of substance abuse, DM, stroke, dyspnea, s/p loop recorder implant,  gait difficulties (ambulates with a walker) , and hypertension  who presented on 08/18/2020 with left lower extremity weakness.   Evaluated by Dr. Erlinda Hong with stroke work-up negative for acute abnormality with evidence of chronic infarct over the right convexity extending into the right occipital lobe.  Symptoms likely recrudescence of old stroke in setting of mild UTI.      ROS:   14 system review of systems performed and negative with exception of ***  PMH:  Past Medical History:  Diagnosis Date  . Anxiety   . Arthritis   . Depression   . Fibroids    abd  . GERD (gastroesophageal reflux disease)   . Hypertension    dr Ayesha Rumpf     immanuel fp  . Shortness of breath   . Stroke [facility])    memory loss  . Substance abuse ([place])     PSH:  Past Surgical History:  Procedure Laterality Date  . ABDOMINAL HYSTERECTOMY    . CESAREAN SECTION    . CHOLECYSTECTOMY    . INSERTION OF MESH N/A 06/25/2013   Procedure: INSERTION OF MESH;  Surgeon: Merrie Roof, MD;  Location: [facility];  Service: General;  Laterality: N/A;  . 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 LOOP RECORDER IMPLANT N/A 03/26/2014   Procedure: LOOP RECORDER IMPLANT;  Surgeon: Deboraha Sprang, MD;  Location: [facility] CATH LAB;  Service: Cardiovascular;  Laterality: N/A;  . TEE WITHOUT CARDIOVERSION N/A 03/25/2014   Procedure: TRANSESOPHAGEAL ECHOCARDIOGRAM (TEE);  Surgeon: Dorothy Spark, MD;  Location: [facility];  Service:  Cardiovascular;  Laterality: N/A;  . VENTRAL HERNIA REPAIR N/A 06/25/2013   Procedure: LAPAROSCOPIC VENTRAL HERNIA;  Surgeon: Merrie Roof, MD;  Location: [facility] OR;  Service: General;  Laterality: N/A;    Social History:  Social History   Socioeconomic History  . Marital status: Widowed    Spouse name: Not on file  . Number of children: Not on file  . Years of education: Not on file  . Highest education level: Not on file  Occupational History  . Not on file  Tobacco Use  . Smoking status: Former Smoker    Packs/day: 0.25    Years: 15.00    Pack years: 3.75    Quit date: 06/18/2013    Years since quitting: 7.3  . Smokeless tobacco: Never Used  Substance and Sexual Activity  . Alcohol use: No    Comment: stopped  . Drug use: Yes    Types: Cocaine    Comment: stopped x 1 yr  . Sexual activity: Not on file  Other Topics Concern  . Not on file  Social History Narrative  . Not on file   Social Determinants of Health   Financial Resource Strain:   . Difficulty of Paying Living Expenses: Not on file  Food Insecurity:   . Worried About Charity fundraiser in the Last Year:  Not on file  . Ran Out of Food in the Last Year: Not on file  Transportation Needs:   . Lack of Transportation (Medical): Not on file  . Lack of Transportation (Non-Medical): Not on file  Physical Activity:   . Days of Exercise per Week: Not on file  . Minutes of Exercise per Session: Not on file  Stress:   . Feeling of Stress : Not on file  Social Connections:   . Frequency of Communication with Friends and Family: Not on file  . Frequency of Social Gatherings with Friends and Family: Not on file  . Attends Religious Services: Not on file  . Active Member of Clubs or Organizations: Not on file  . Attends Archivist Meetings: Not on file  . Marital Status: Not on file  Intimate Partner Violence:   . Fear of Current or Ex-Partner: Not on file  . Emotionally Abused: Not on file  .  Physically Abused: Not on file  . Sexually Abused: Not on file    Family History:  Family History  Problem Relation Age of Onset  . Cancer Mother        Breast  . Hypertension Mother   . Diabetes Mother   . Hyperlipidemia Mother   . Thyroid disease Mother   . Cancer Father        Prostate  . Asthma Brother     Medications:   Current Outpatient Medications on File Prior to Visit  Medication Sig Dispense Refill  . ALPRAZolam (XANAX) 0.25 MG tablet Take 1 tablet (0.25 mg total) by mouth 3 (three) times daily as needed for sleep or anxiety. (Patient not taking: Reported on 08/19/2020) 30 tablet 0  . amLODipine (NORVASC) 5 MG tablet Take 5 mg by mouth daily.    Marland Kitchen atorvastatin (LIPITOR) 80 MG tablet Take 1 tablet (80 mg total) by mouth daily at 6 PM. 30 tablet 0  . clopidogrel (PLAVIX) 75 MG tablet Take 1 tablet (75 mg total) by mouth daily with breakfast. (Patient not taking: Reported on 08/19/2020) 30 tablet 1  . clotrimazole-betamethasone (LOTRISONE) cream Apply 1 application topically daily as needed.    . furosemide (LASIX) 20 MG tablet Take 20 mg by mouth daily.    Marland Kitchen ibuprofen (ADVIL,MOTRIN) 200 MG tablet Take 200 mg by mouth every 6 (six) hours as needed for moderate pain.    Marland Kitchen losartan (COZAAR) 100 MG tablet Take 100 mg by mouth daily.    . metFORMIN (GLUCOPHAGE-XR) 500 MG 24 hr tablet Take 500 mg by mouth daily.    . Multiple Vitamin (MULTIVITAMIN) capsule Take 1 capsule by mouth daily.    Marland Kitchen omeprazole (PRILOSEC) 40 MG capsule Take 40 mg by mouth daily.     No current facility-administered medications on file prior to visit.    Allergies:  No Known Allergies    OBJECTIVE:  Physical Exam  There were no vitals filed for this visit. There is no height or weight on file to calculate BMI. No exam data present  No flowsheet data found.   General: well developed, well nourished, seated, in no evident distress Head: head normocephalic and atraumatic.   Neck: supple with no  carotid or supraclavicular bruits Cardiovascular: regular rate and rhythm, no murmurs Musculoskeletal: no deformity Skin:  no rash/petichiae Vascular:  Normal pulses all extremities   Neurologic Exam Mental Status: Awake and fully alert. Oriented to place and time. Recent and remote memory intact. Attention span, concentration and fund of knowledge  appropriate. Mood and affect appropriate.  Cranial Nerves: Fundoscopic exam reveals sharp disc margins. Pupils equal, briskly reactive to light. Extraocular movements full without nystagmus. Visual fields full to confrontation. Hearing intact. Facial sensation intact. Face, tongue, palate moves normally and symmetrically.  Motor: Normal bulk and tone. Normal strength in all tested extremity muscles. Sensory.: intact to touch , pinprick , position and vibratory sensation.  Coordination: Rapid alternating movements normal in all extremities. Finger-to-nose and heel-to-shin performed accurately bilaterally. Gait and Station: Arises from chair without difficulty. Stance is normal. Gait demonstrates normal stride length and balance Reflexes: 1+ and symmetric. Toes downgoing.     NIHSS  *** Modified Rankin  *** CHA2DS2-VASc *** HAS-BLED ***     ASSESSMENT: SARANN TREGRE is a 69 y.o. year old female presented with *** on *** secondary to ***. Vascular risk factors include ***.      PLAN:  1. *** : Residual deficit: ***. Continue {anticoagulants:31417}  and ***  for secondary stroke prevention. Close PCP follow up for aggressive stroke risk factor management  2. HTN: BP goal <130/90. Continue f/u with PCP 3. HLD: LDL goal <70. Recent LDL ***. F/u with PCP for management as well as prescribing of statin 4. DMII: A1c goal<7.0. Recent A1c ***. F/u with PCP    Follow up in *** or call earlier if needed   I spent *** minutes of face-to-face and non-face-to-face time with patient.  This included previsit chart review, lab review, study  review, order entry, electronic health record documentation, patient education regarding recent stroke, residual deficits, importance of managing stroke risk factors and answered all questions to patient satisfaction     Frann Rider, [facility]  [facility] [address] [place], [place] [address]  Phone [phone] Fax [phone] Note: This document was prepared with digital dictation and possible smart phrase technology. Any transcriptional errors that result from this process are unintentional.

## 2020-11-04 ENCOUNTER — Other Ambulatory Visit: Payer: Self-pay | Admitting: Student

## 2020-11-16 ENCOUNTER — Emergency Department (HOSPITAL_COMMUNITY): Payer: Medicare HMO

## 2020-11-16 ENCOUNTER — Emergency Department (HOSPITAL_COMMUNITY)
Admission: EM | Admit: 2020-11-16 | Discharge: 2020-11-22 | Disposition: A | Discharge status: Home / Self Care | Payer: Medicare HMO | Attending: Emergency Medicine | Admitting: Emergency Medicine

## 2020-11-16 DIAGNOSIS — Z8673 Personal history of transient ischemic attack (TIA), and cerebral infarction without residual deficits: Secondary | ICD-10-CM | POA: Diagnosis not present

## 2020-11-16 DIAGNOSIS — I10 Essential (primary) hypertension: Secondary | ICD-10-CM | POA: Diagnosis not present

## 2020-11-16 DIAGNOSIS — M25561 Pain in right knee: Secondary | ICD-10-CM

## 2020-11-16 DIAGNOSIS — Z87891 Personal history of nicotine dependence: Secondary | ICD-10-CM | POA: Insufficient documentation

## 2020-11-16 DIAGNOSIS — Z79899 Other long term (current) drug therapy: Secondary | ICD-10-CM | POA: Insufficient documentation

## 2020-11-16 DIAGNOSIS — R296 Repeated falls: Secondary | ICD-10-CM | POA: Diagnosis not present

## 2020-11-16 DIAGNOSIS — M25572 Pain in left ankle and joints of left foot: Secondary | ICD-10-CM | POA: Diagnosis not present

## 2020-11-16 DIAGNOSIS — Z20822 Contact with and (suspected) exposure to covid-19: Secondary | ICD-10-CM | POA: Diagnosis not present

## 2020-11-16 DIAGNOSIS — S8252XA Displaced fracture of medial malleolus of left tibia, initial encounter for closed fracture: Secondary | ICD-10-CM

## 2020-11-16 DIAGNOSIS — Z7901 Long term (current) use of anticoagulants: Secondary | ICD-10-CM | POA: Diagnosis not present

## 2020-11-16 LAB — CBC WITH DIFFERENTIAL/PLATELET
Abs Immature Granulocytes: 0.03 10*3/uL (ref 0.00–0.07)
Basophils Absolute: 0 10*3/uL (ref 0.0–0.1)
Basophils Relative: 1 %
Eosinophils Absolute: 0.2 10*3/uL (ref 0.0–0.5)
Eosinophils Relative: 2 %
HCT: 38.6 % (ref 36.0–46.0)
Hemoglobin: 12 g/dL (ref 12.0–15.0)
Immature Granulocytes: 0 %
Lymphocytes Relative: 22 %
Lymphs Abs: 1.6 10*3/uL (ref 0.7–4.0)
MCH: 29.8 pg (ref 26.0–34.0)
MCHC: 31.1 g/dL (ref 30.0–36.0)
MCV: 95.8 fL (ref 80.0–100.0)
Monocytes Absolute: 0.6 10*3/uL (ref 0.1–1.0)
Monocytes Relative: 9 %
Neutro Abs: 4.7 10*3/uL (ref 1.7–7.7)
Neutrophils Relative %: 66 %
Platelets: 250 10*3/uL (ref 150–400)
RBC: 4.03 MIL/uL (ref 3.87–5.11)
RDW: 13.3 % (ref 11.5–15.5)
WBC: 7.1 10*3/uL (ref 4.0–10.5)
nRBC: 0 % (ref 0.0–0.2)

## 2020-11-16 LAB — URINALYSIS, ROUTINE W REFLEX MICROSCOPIC
Bilirubin Urine: NEGATIVE
Glucose, UA: NEGATIVE mg/dL
Ketones, ur: NEGATIVE mg/dL
Nitrite: NEGATIVE
Protein, ur: NEGATIVE mg/dL
Specific Gravity, Urine: 1.013 (ref 1.005–1.030)
pH: 5 (ref 5.0–8.0)

## 2020-11-16 LAB — COMPREHENSIVE METABOLIC PANEL
ALT: 14 U/L (ref 0–44)
AST: 16 U/L (ref 15–41)
Albumin: 3.2 g/dL — ABNORMAL LOW (ref 3.5–5.0)
Alkaline Phosphatase: 130 U/L — ABNORMAL HIGH (ref 38–126)
Anion gap: 13 (ref 5–15)
BUN: 16 mg/dL (ref 8–23)
CO2: 28 mmol/L (ref 22–32)
Calcium: 9.3 mg/dL (ref 8.9–10.3)
Chloride: 101 mmol/L (ref 98–111)
Creatinine, Ser: 1.32 mg/dL — ABNORMAL HIGH (ref 0.44–1.00)
GFR, Estimated: 44 mL/min — ABNORMAL LOW (ref >60)
Glucose, Bld: 127 mg/dL — ABNORMAL HIGH (ref 70–99)
Potassium: 3.9 mmol/L (ref 3.5–5.1)
Sodium: 142 mmol/L (ref 135–145)
Total Bilirubin: 1.1 mg/dL (ref 0.3–1.2)
Total Protein: 7.6 g/dL (ref 6.5–8.1)

## 2020-11-16 LAB — RESP PANEL BY RT-PCR (FLU A&B, COVID) ARPGX2
Influenza A by PCR: NEGATIVE
Influenza B by PCR: NEGATIVE
SARS Coronavirus 2 by RT PCR: NEGATIVE

## 2020-11-16 MED ORDER — METFORMIN HCL 500 MG PO TABS
500.0000 mg | ORAL_TABLET | Freq: Two times a day (BID) | ORAL | Status: DC
  Administered 2020-11-16 – 2020-11-22 (×11): 500 mg via ORAL
  Filled 2020-11-16 (×12): qty 1

## 2020-11-16 MED ORDER — FUROSEMIDE 20 MG PO TABS
20.0000 mg | ORAL_TABLET | Freq: Every day | ORAL | Status: DC
  Administered 2020-11-17 – 2020-11-22 (×6): 20 mg via ORAL
  Filled 2020-11-16 (×6): qty 1

## 2020-11-16 MED ORDER — OXYCODONE-ACETAMINOPHEN 5-325 MG PO TABS
1.0000 | ORAL_TABLET | Freq: Once | ORAL | Status: AC
  Administered 2020-11-16: 1 via ORAL
  Filled 2020-11-16: qty 1

## 2020-11-16 MED ORDER — LOSARTAN POTASSIUM 50 MG PO TABS
100.0000 mg | ORAL_TABLET | Freq: Every day | ORAL | Status: DC
  Administered 2020-11-17 – 2020-11-22 (×6): 100 mg via ORAL
  Filled 2020-11-16 (×6): qty 2

## 2020-11-16 MED ORDER — ATORVASTATIN CALCIUM 80 MG PO TABS
80.0000 mg | ORAL_TABLET | Freq: Every day | ORAL | Status: DC
  Administered 2020-11-16 – 2020-11-22 (×7): 80 mg via ORAL
  Filled 2020-11-16: qty 8
  Filled 2020-11-16: qty 1
  Filled 2020-11-16 (×2): qty 8
  Filled 2020-11-16 (×2): qty 1

## 2020-11-16 MED ORDER — AMLODIPINE BESYLATE 5 MG PO TABS
5.0000 mg | ORAL_TABLET | Freq: Every day | ORAL | Status: DC
  Administered 2020-11-17 – 2020-11-22 (×6): 5 mg via ORAL
  Filled 2020-11-16 (×6): qty 1

## 2020-11-16 MED ORDER — PANTOPRAZOLE SODIUM 40 MG PO TBEC
40.0000 mg | DELAYED_RELEASE_TABLET | Freq: Every day | ORAL | Status: DC
  Administered 2020-11-17 – 2020-11-22 (×6): 40 mg via ORAL
  Filled 2020-11-16 (×6): qty 1

## 2020-11-16 MED ORDER — OXYCODONE-ACETAMINOPHEN 5-325 MG PO TABS
1.0000 | ORAL_TABLET | Freq: Four times a day (QID) | ORAL | Status: DC | PRN
  Administered 2020-11-16 – 2020-11-17 (×2): 1 via ORAL
  Filled 2020-11-16 (×2): qty 1

## 2020-11-16 NOTE — ED Notes (Signed)
Pt transported to xray 

## 2020-11-16 NOTE — Evaluation (Signed)
Physical Therapy Evaluation Patient Details Name: Deborah Whitehead MRN: [id number] DOB: [birth year] Today's Date: 11/16/2020   History of Present Illness  Pt is a 69 y/o female presenting to the ED following a fall. Imaging negative throughout RLE and LLE. PMH includes CVA and HTN.   Clinical Impression  Pt admitted secondary to problem above with deficits below. Pt very anxious throughout and presenting with increased pain which limited mobility. Required max A to sit at edge of stretcher. Further mobility deferred secondary to pain. Pt coached on pursed lip breathing throughout. Reports she lives with her daughter, however, her daughter hurt herself when pt fell and will not be able to assist pt. Feel she would benefit from SNF level therapies at d/c. Will continue to follow acutely.     Follow Up Recommendations SNF;Supervision/Assistance - 24 hour    Equipment Recommendations  Other (comment);Hospital bed (hoyer lift and hoyer pad)    Recommendations for Other Services       Precautions / Restrictions Precautions Precautions: Fall Precaution Comments: Pt reporting 2 recent falls.  Restrictions Weight Bearing Restrictions: Yes LLE Weight Bearing: Non weight bearing Other Position/Activity Restrictions: Per Pt she is NWB on LLE.       Mobility  Bed Mobility Overal bed mobility: Needs Assistance Bed Mobility: Sit to Supine;Supine to Sit     Supine to sit: Max assist Sit to supine: Max assist   General bed mobility comments: Max A for trunk and LE assist. Increased time required to come to sitting. Pt with increased pain and requesting to defer further mobility.     Transfers                    Ambulation/Gait                Stairs            Wheelchair Mobility    Modified Rankin (Stroke Patients Only)       Balance Overall balance assessment: Needs assistance Sitting-balance support: No upper extremity supported;Feet supported Sitting  balance-Leahy Scale: Fair                                       Pertinent Vitals/Pain Pain Assessment: Faces Faces Pain Scale: Hurts whole lot Pain Location: L ankle and R knee  Pain Descriptors / Indicators: Aching;Guarding;Grimacing;Moaning;Crying Pain Intervention(s): Limited activity within patient's tolerance;Monitored during session;Repositioned    Home Living Family/patient expects to be discharged to:: Skilled nursing facility                 Additional Comments: Reports she was staying with her daughter who hurt her back and cannot assist her anymore.     Prior Function Level of Independence: Needs assistance   Gait / Transfers Assistance Needed: Per pt, her daughter was having to help with transfers using RW. Used WC for mobility.   ADL's / Homemaking Assistance Needed: Required assist from her daughter.         Hand Dominance        Extremity/Trunk Assessment   Upper Extremity Assessment Upper Extremity Assessment: Generalized weakness    Lower Extremity Assessment Lower Extremity Assessment: LLE deficits/detail;RLE deficits/detail RLE Deficits / Details: Decreased ROM in R knee secondary to soreness.  LLE Deficits / Details: L ankle in cast and ankle immobilizer. Pt unable to wiggle toes at baseline. Could perform heel slide with assist.  Cervical / Trunk Assessment Cervical / Trunk Assessment: Normal  Communication   Communication: No difficulties  Cognition Arousal/Alertness: Awake/alert Behavior During Therapy: Anxious;Restless Overall Cognitive Status: No family/caregiver present to determine baseline cognitive functioning                                 General Comments: Pt very anxious throughout mobility tasks. Crying during assessment. required cues for pursed lip breathing. Pt with difficulty answering some PLOF questions and gave conflicting information.       General Comments      Exercises      Assessment/Plan    PT Assessment Patient needs continued PT services  PT Problem List Decreased strength;Decreased balance;Decreased mobility;Decreased activity tolerance;Decreased range of motion;Decreased knowledge of use of DME;Decreased knowledge of precautions       PT Treatment Interventions Gait training;DME instruction;Functional mobility training;Therapeutic activities;Therapeutic exercise;Balance training;Patient/family education    PT Goals (Current goals can be found in the Care Plan section)  Acute Rehab PT Goals Patient Stated Goal: to decrease pain  PT Goal Formulation: With patient Time For Goal Achievement: 11/30/20 Potential to Achieve Goals: Fair    Frequency Min 2X/week   Barriers to discharge        Co-evaluation               AM-PAC PT "6 Clicks" Mobility  Outcome Measure Help needed turning from your back to your side while in a flat bed without using bedrails?: Total Help needed moving from lying on your back to sitting on the side of a flat bed without using bedrails?: Total Help needed moving to and from a bed to a chair (including a wheelchair)?: Total Help needed standing up from a chair using your arms (e.g., wheelchair or bedside chair)?: Total Help needed to walk in hospital room?: Total Help needed climbing 3-5 steps with a railing? : Total 6 Click Score: 6    End of Session   Activity Tolerance: Patient limited by pain Patient left: in bed;with call bell/phone within reach (on stretcher in ED ) Nurse Communication: Mobility status PT Visit Diagnosis: Unsteadiness on feet (R26.81);Muscle weakness (generalized) (M62.81)    Time: 3009-2330 PT Time Calculation (min) (ACUTE ONLY): 15 min   Charges:   PT Evaluation $PT Eval Moderate Complexity: 1 Mod          Reuel Derby, PT, DPT  Acute Rehabilitation Services  Pager: [phone] Office: [phone]   Rudean Hitt 11/16/2020, 4:14 PM

## 2020-11-16 NOTE — ED Triage Notes (Signed)
Pt here from home with c/o right knee pain after a fall at home , pt already has a fx to the left leg from a fall on the 11/22 in [place]

## 2020-11-16 NOTE — ED Provider Notes (Addendum)
Signout note  69 year old lady with falls, reported injury to left leg on 11/22 in [place] presenting to ER with concern for right knee pain after fall and unable to walk.  X-rays today were negative.  Consult to transitions of care and physical therapy.  Patient will need either home health or potentially SNF placement for rehab.  3:30 PM Received signout from [place], follow-up TOC and PT recs  4:09 PM PT recommending placement, will check basic labs, if negative will plan to board in ER  Basic labs wnl. Xrays negative, given ongoing pain, will check CTs  CT with small avulsion fx off tip of medial malleolus.  For this fracture, believe patient can be safely managed in a cam walker boot, weightbearing as tolerated on left leg.  Likely nonoperative management but should have orthopedic follow-up in the outpatient setting.  No fracture on right leg so she can also bear weight on this leg.  Can continue soft knee brace.  Due to patient's inability to walk, and physical therapy recommendations, will board patient overnight in anticipation of rehab placement by social work tomorrow morning.  Reviewed meds with patient and placed orders.  Place order for walking boot on left ankle to replace her current splint that was placed outside facility.    Lucrezia Starch, MD 11/16/20 [phone]

## 2020-11-16 NOTE — Progress Notes (Signed)
Orthopedic Tech Progress Note Patient Details:  Deborah Whitehead 1951/11/21 [id number]  Ortho Devices Type of Ortho Device: Knee Sleeve Ortho Device/Splint Location: RLE Ortho Device/Splint Interventions: Ordered, Application, Adjustment   Post Interventions Patient Tolerated: Well Instructions Provided: Care of device   Janit Pagan 11/16/2020, 1:10 PM

## 2020-11-16 NOTE — Social Work (Signed)
CSW met with Pt at bedside. Pt is engaged, A&Ox4, but presents as anxious and tearful.  Pt reports that she uses a walker regularly, but has experienced several falls recently. PT has evaluated Pt and recommended SNF placement. TOC assessment complete, FL2 faxed to several area facilities.  Pt has only had first Covid vaccination (per chart and confirmed per Pt) TOC team consulted Inpatient Covid Vaccination Program to request vaccination.

## 2020-11-16 NOTE — NC FL2 (Signed)
[place] LEVEL OF CARE SCREENING TOOL     IDENTIFICATION  Patient Name: Deborah Whitehead Birthdate: [birth year] Sex: female Admission Date (Current Location): 11/16/2020  [facility] and [place] Number:  Herbalist and Address:  [facility], [place] [address], [place], [place] [address]      Provider Number: [id number]  Attending Physician Name and Address:  Lucrezia Starch, MD  Relative Name and Phone Number:  Shaneya, Taketa Daughter [phone]    Current Level of Care: Hospital Recommended Level of Care: [place] Prior Approval Number:    Date Approved/Denied:   PASRR Number: [id number]  Discharge Plan: SNF    Current Diagnoses: Patient Active Problem List   Diagnosis Date Noted  . Weakness 08/19/2020  . TIA (transient ischemic attack) 03/23/2014  . Anxiety 03/23/2014  . CVA (cerebral vascular accident) ([place]) 07/15/2013  . Obesity, unspecified 07/13/2013  . Other and unspecified hyperlipidemia 07/13/2013  . Cigarette smoker 07/13/2013  . Stroke ([place]) 07/10/2013  . HTN (hypertension) 07/10/2013  . Abdominal pain, unspecified site 05/13/2013    Orientation RESPIRATION BLADDER Height & Weight     Self, Time, Situation, Place  Normal Incontinent Weight: 180 lb (81.6 kg) Height:  5\' 3"  (160 cm)  BEHAVIORAL SYMPTOMS/MOOD NEUROLOGICAL BOWEL NUTRITION STATUS      Incontinent Diet (Regular)  AMBULATORY STATUS COMMUNICATION OF NEEDS Skin   Extensive Assist Verbally Normal                       Personal Care Assistance Level of Assistance  Bathing, Feeding, Dressing Bathing Assistance: Maximum assistance Feeding assistance: Independent Dressing Assistance: Maximum assistance     Functional Limitations Info  Sight, Hearing, Speech Sight Info: Adequate Hearing Info: Adequate Speech Info: Adequate    SPECIAL CARE FACTORS FREQUENCY  PT (By licensed PT), OT (By licensed OT)     PT  Frequency: 5x weekly OT Frequency: 5x weekly            Contractures Contractures Info: Not present    Additional Factors Info  Code Status Code Status Info: Full             Current Medications (11/16/2020):  This is the current hospital active medication list Current Facility-Administered Medications  Medication Dose Route Frequency Provider Last Rate Last Admin  . oxyCODONE-acetaminophen (PERCOCET/ROXICET) 5-325 MG per tablet 1 tablet  1 tablet Oral Once Lucrezia Starch, MD       Current Outpatient Medications  Medication Sig Dispense Refill  . ALPRAZolam (XANAX) 0.25 MG tablet Take 1 tablet (0.25 mg total) by mouth 3 (three) times daily as needed for sleep or anxiety. (Patient not taking: Reported on 08/19/2020) 30 tablet 0  . amLODipine (NORVASC) 5 MG tablet Take 5 mg by mouth daily.    Marland Kitchen atorvastatin (LIPITOR) 80 MG tablet Take 1 tablet (80 mg total) by mouth daily at 6 PM. 30 tablet 0  . clopidogrel (PLAVIX) 75 MG tablet Take 1 tablet (75 mg total) by mouth daily with breakfast. (Patient not taking: Reported on 08/19/2020) 30 tablet 1  . clotrimazole-betamethasone (LOTRISONE) cream Apply 1 application topically daily as needed.    . furosemide (LASIX) 20 MG tablet Take 20 mg by mouth daily.    Marland Kitchen ibuprofen (ADVIL,MOTRIN) 200 MG tablet Take 200 mg by mouth every 6 (six) hours as needed for moderate pain.    Marland Kitchen losartan (COZAAR) 100 MG tablet Take 100 mg by mouth daily.    Marland Kitchen  metFORMIN (GLUCOPHAGE-XR) 500 MG 24 hr tablet Take 500 mg by mouth daily.    . Multiple Vitamin (MULTIVITAMIN) capsule Take 1 capsule by mouth daily.    Marland Kitchen omeprazole (PRILOSEC) 40 MG capsule Take 40 mg by mouth daily.       Discharge Medications: Please see discharge summary for a list of discharge medications.  Relevant Imaging Results:  Relevant Lab Results:   Additional Information SSN: [id number]  Vergie Living, LCSW

## 2020-11-16 NOTE — ED Provider Notes (Signed)
[facility]ERGENCY DEPARTMENT Provider Note   CSN: [id number] Arrival date & time: 11/16/20  7782     History No chief complaint on file. FAll  Deborah Whitehead is a 69 y.o. female.  The history is provided by the patient and medical records. No language interpreter was used.  Fall This is a recurrent problem. The current episode started 3 to 5 hours ago. The problem has not changed since onset.Pertinent negatives include no chest pain, no abdominal pain, no headaches and no shortness of breath. Nothing aggravates the symptoms. Nothing relieves the symptoms. She has tried nothing for the symptoms. The treatment provided no relief.       Past Medical History:  Diagnosis Date  . Anxiety   . Arthritis   . Depression   . Fibroids    abd  . GERD (gastroesophageal reflux disease)   . Hypertension    dr Ayesha Rumpf     immanuel fp  . Shortness of breath   . Stroke [facility])    memory loss  . Substance abuse [facility])     Patient Active Problem List   Diagnosis Date Noted  . Weakness 08/19/2020  . TIA (transient ischemic attack) 03/23/2014  . Anxiety 03/23/2014  . CVA (cerebral vascular accident) ([place]) 07/15/2013  . Obesity, unspecified 07/13/2013  . Other and unspecified hyperlipidemia 07/13/2013  . Cigarette smoker 07/13/2013  . Stroke ([place]) 07/10/2013  . HTN (hypertension) 07/10/2013  . Abdominal pain, unspecified site 05/13/2013    Past Surgical History:  Procedure Laterality Date  . ABDOMINAL HYSTERECTOMY    . CESAREAN SECTION    . CHOLECYSTECTOMY    . INSERTION OF MESH N/A 06/25/2013   Procedure: INSERTION OF MESH;  Surgeon: Merrie Roof, MD;  Location: [facility];  Service: General;  Laterality: N/A;  . 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 LOOP RECORDER IMPLANT N/A 03/26/2014   Procedure: LOOP RECORDER IMPLANT;  Surgeon: Deboraha Sprang, MD;  Location: [facility] CATH LAB;  Service: Cardiovascular;   Laterality: N/A;  . TEE WITHOUT CARDIOVERSION N/A 03/25/2014   Procedure: TRANSESOPHAGEAL ECHOCARDIOGRAM (TEE);  Surgeon: Dorothy Spark, MD;  Location: [facility];  Service: Cardiovascular;  Laterality: N/A;  . VENTRAL HERNIA REPAIR N/A 06/25/2013   Procedure: LAPAROSCOPIC VENTRAL HERNIA;  Surgeon: Merrie Roof, MD;  Location: [facility];  Service: General;  Laterality: N/A;     OB History   No obstetric history on file.     Family History  Problem Relation Age of Onset  . Cancer Mother        Breast  . Hypertension Mother   . Diabetes Mother   . Hyperlipidemia Mother   . Thyroid disease Mother   . Cancer Father        Prostate  . Asthma Brother     Social History   Tobacco Use  . Smoking status: Former Smoker    Packs/day: 0.25    Years: 15.00    Pack years: 3.75    Quit date: 06/18/2013    Years since quitting: 7.4  . Smokeless tobacco: Never Used  Substance Use Topics  . Alcohol use: No    Comment: stopped  . Drug use: Yes    Types: Cocaine    Comment: stopped x 1 yr    Home Medications Prior to Admission medications   Medication Sig Start Date End Date Taking? Authorizing Provider  ALPRAZolam Duanne Moron)  0.25 MG tablet Take 1 tablet (0.25 mg total) by mouth 3 (three) times daily as needed for sleep or anxiety. Patient not taking: Reported on 08/19/2020 03/29/14   Cristal Ford, DO  amLODipine (NORVASC) 5 MG tablet Take 5 mg by mouth daily. 08/05/20   [provider]  atorvastatin (LIPITOR) 80 MG tablet Take 1 tablet (80 mg total) by mouth daily at 6 PM. 08/23/20   Andrew Au, MD  clopidogrel (PLAVIX) 75 MG tablet Take 1 tablet (75 mg total) by mouth daily with breakfast. Patient not taking: Reported on 08/19/2020 07/21/13   Angiulli, Lavon Paganini, PA-C  clotrimazole-betamethasone (LOTRISONE) cream Apply 1 application topically daily as needed.    [provider]  furosemide (LASIX) 20 MG tablet Take 20 mg by mouth daily. 08/05/20   [provider]  ibuprofen (ADVIL,MOTRIN) 200 MG tablet Take 200 mg by mouth every 6 (six) hours as needed for moderate pain.    [provider]  losartan (COZAAR) 100 MG tablet Take 100 mg by mouth daily.    [provider]  metFORMIN (GLUCOPHAGE-XR) 500 MG 24 hr tablet Take 500 mg by mouth daily.    [provider]  Multiple Vitamin (MULTIVITAMIN) capsule Take 1 capsule by mouth daily.    [provider]  omeprazole (PRILOSEC) 40 MG capsule Take 40 mg by mouth daily.    [provider]    Allergies    Patient has no known allergies.  Review of Systems   Review of Systems  Constitutional: Negative for chills, diaphoresis, fatigue and fever.  HENT: Negative for congestion.   Eyes: Negative for visual disturbance.  Respiratory: Negative for cough, chest tightness and shortness of breath.   Cardiovascular: Negative for chest pain.  Gastrointestinal: Negative for abdominal pain, diarrhea, nausea and vomiting.  Genitourinary: Negative for dysuria and flank pain.  Musculoskeletal: Negative for back pain, neck pain and neck stiffness.  Skin: Negative for wound.  Neurological: Negative for light-headedness and headaches.  Psychiatric/Behavioral: Negative for agitation.  All other systems reviewed and are negative.   Physical Exam Updated Vital Signs BP (!) 145/75   Pulse 86   Temp 98.5 F (36.9 C) (Oral)   Resp 17   Ht 5\' 3"  (1.6 m)   Wt 81.6 kg   SpO2 95%   BMI 31.89 kg/m   Physical Exam Vitals and nursing note reviewed.  Constitutional:      General: She is not in acute distress.    Appearance: She is well-developed. She is not ill-appearing, toxic-appearing or diaphoretic.  HENT:     Head: Normocephalic and atraumatic.     Right Ear: External ear normal.     Left Ear: External ear normal.     Nose: Nose normal.     Mouth/Throat:     Pharynx: No oropharyngeal exudate.  Eyes:     Conjunctiva/sclera: Conjunctivae normal.     Pupils:  Pupils are equal, round, and reactive to light.  Cardiovascular:     Rate and Rhythm: Normal rate.     Heart sounds: No murmur heard.   Pulmonary:     Effort: No respiratory distress.     Breath sounds: No stridor. No wheezing, rhonchi or rales.  Abdominal:     General: Abdomen is flat. There is no distension.     Tenderness: There is no abdominal tenderness. There is no right CVA tenderness, left CVA tenderness, guarding or rebound.  Musculoskeletal:        General:  Tenderness present. No deformity.     Cervical back: Normal range of motion and neck supple. No tenderness.       Legs:     Comments: Tenderness in right knee with palpation.  Can still bend it.  Intact pulses.  Intact sensation in both feet.  Weakness in left toes at her baseline reportedly chronically.  Left shin in a wrap.  Hips nontender.  Exam otherwise unremarkable today.   Skin:    General: Skin is warm.     Capillary Refill: Capillary refill takes less than 2 seconds.     Findings: No erythema or rash.  Neurological:     Mental Status: She is alert and oriented to person, place, and time. Mental status is at baseline.     Sensory: No sensory deficit.     Motor: No weakness (weakness in L toes at baseline reportedly) or abnormal muscle tone.     Deep Tendon Reflexes: Reflexes are normal and symmetric.  Psychiatric:        Mood and Affect: Mood normal.     ED Results / Procedures / Treatments   Labs (all labs ordered are listed, but only abnormal results are displayed) Labs Reviewed - No data to display  EKG None  Radiology DG Knee 2 Views Right  Result Date: 11/16/2020 CLINICAL DATA:  RIGHT knee pain, fall round Thanksgiving with continued pain EXAM: RIGHT KNEE - 1-2 VIEW COMPARISON:  None FINDINGS: Signs of tricompartmental degenerative change. Greatest in medial and patellofemoral compartments. No sign of joint effusion. No signs of fracture or dislocation. IMPRESSION: Signs of tricompartmental  degenerative change without sign of fracture or dislocation. Electronically Signed   By: Zetta Bills M.D.   On: 11/16/2020 09:52   DG Tibia/Fibula Left  Result Date: 11/16/2020 CLINICAL DATA:  Pain EXAM: LEFT TIBIA AND FIBULA - 2 VIEW COMPARISON:  None. FINDINGS: Frontal and lateral views were obtained. There is an apparent immobilization device over the lower extremity. There is no appreciable fracture or dislocation. No abnormal periosteal reaction. No appreciable joint space narrowing. There are prominent inferior and posterior calcaneal spurs. IMPRESSION: Overlying immobilization device. No fracture or dislocation evident. No appreciable joint space narrowing. Calcaneal spurs noted. Electronically Signed   By: Lowella Grip III M.D.   On: 11/16/2020 09:46   DG Ankle Complete Left  Result Date: 11/16/2020 CLINICAL DATA:  Left ankle fracture. EXAM: LEFT ANKLE COMPLETE - 3+ VIEW COMPARISON:  None. FINDINGS: The left ankle has been casted and immobilized. No definite fracture or dislocation is noted, although this may be obscured by overlying cast. Posterior calcaneal spurring is noted. IMPRESSION: No definite fracture or dislocation is noted, although this may be obscured by overlying cast. Electronically Signed   By: Marijo Conception M.D.   On: 11/16/2020 11:28    Procedures Procedures (including critical care time)  Medications Ordered in ED Medications - No data to display  ED Course  I have reviewed the triage vital signs and the nursing notes.  Pertinent labs & imaging results that were available during my care of the patient were reviewed by me and considered in my medical decision making (see chart for details).    MDM Rules/Calculators/A&P                          LETANYA FROH is a 69 y.o. female with a past medical history significant for prior stroke, hypertension, GERD, depression, anxiety, and recurrent falls  who presents with right knee pain and left ankle pain  after a fall.  She reports that last week, she had a fall and has a small fracture in her left ankle.  She is in a protective immobilizing wrap on her left shin.  She reports that she frequently has falls and was using her walker this morning when it "slipped out from under her and she fell.  She reports she hit her right knee and then her left ankle started hurting worse.  She denies numbness, tingling, weakness distally.  She denies any hip pain.  Denies any back pain, chest pain, abdominal pain, headache, or other injuries.  She is only concerned about her right knee pain and left ankle pain.  She is concerned she may need a better immobilizing splint or brace on her left ankle.  On my evaluation, patient's lungs are clear and chest is nontender.  Abdomen is nontender.  Back is nontender.  She has tenderness in the right knee but has intact range of motion.  Intact sensation and strength and pulses in her right leg.  Patient's left shin is in a wrap and she has a splint on the left ankle.  Patient's left ankle is tender to palpation and she is able to feel her toes.  She reports she is never been able to move her left toes chronically.  This is at his baseline.  She denies other complaints.  We will get x-ray of her right knee, and left ankle.  She received a x-ray in triage of her right knee and her left shin which did not show any acute fractures.  Degenerative changes are seen.  We will add a left ankle fracture as this is where she is hurting.  If imaging is all reassuring, anticipate placement of a knee sleeve on the right so she is still able to bend it and then discuss left ankle pain.  Anticipate discussion for discharge after imaging is completed.  X-ray of the right knee that showed arthritis but otherwise no fracture dislocation.  Suspect soft tissue and nonbony injury.  Will place in a soft knee sleeve so she can still try to ambulate.  X-ray of the left ankle did not show fracture however it  is still in the splint.  Cannot rule out occult fracture or fracture was seen on previous imaging.  We will leave it in the splint as she has good cap refill, sensation, and reports her strength is unchanged from her weakness at baseline.  Family reportedly called nursing to express concern for the patient safety with getting around and ambulation at home.  Patient is had 2 falls in the last week that it caused her to have injuries.  We will consult case management and see if they would like [place] to talk with PT/OT for evaluation and to discuss further management.  2:56 PM Spoke with CSW recommends PT to evaluate to determine disposition given her multiple falls in the last week.  We did not find any acute new injuries today.  We will let her continue to work-up as an outpatient for the soft tissue injuries however will wait for PT determine if she is safe for going home.  If they determined she may need admission or placement, patient may need further labs and work-up at that point.  Care transferred to oncoming team while waiting for PT eval and case management recommendations.   Final Clinical Impression(s) / ED Diagnoses Final diagnoses:  Acute pain of  right knee  Acute left ankle pain  Multiple falls    Rx / DC Orders ED Discharge Orders    None     Clinical Impression: 1. Acute pain of right knee   2. Acute left ankle pain   3. Multiple falls     Disposition: Admit  This note was prepared with assistance of Dragon voice recognition software. Occasional wrong-word or sound-a-like substitutions may have occurred due to the inherent limitations of voice recognition software.     Chiann Goffredo, Gwenyth Allegra, MD 11/16/20 1544

## 2020-11-16 NOTE — ED Notes (Addendum)
Daughter in law, Stan Head, given update.

## 2020-11-16 NOTE — Discharge Instructions (Addendum)
Use walking boot for left ankle fracture.  Bear weight as tolerated.  Follow-up with orthopedic surgery and primary doctor.

## 2020-11-17 ENCOUNTER — Emergency Department: Payer: Medicare HMO

## 2020-11-17 DIAGNOSIS — M25561 Pain in right knee: Secondary | ICD-10-CM | POA: Diagnosis not present

## 2020-11-17 DIAGNOSIS — Z23 Encounter for immunization: Secondary | ICD-10-CM

## 2020-11-17 LAB — URINE CULTURE: Culture: <10000 — AB

## 2020-11-17 MED ORDER — METRONIDAZOLE 500 MG PO TABS
2000.0000 mg | ORAL_TABLET | Freq: Once | ORAL | Status: AC
  Administered 2020-11-17: 2000 mg via ORAL
  Filled 2020-11-17: qty 4

## 2020-11-17 MED ORDER — ONDANSETRON 4 MG PO TBDP: 4.0000 mg | ORAL_TABLET | Freq: Once | ORAL | Status: DC

## 2020-11-17 NOTE — ED Notes (Signed)
Lunch Tray Ordered @ 1027. °

## 2020-11-17 NOTE — Progress Notes (Signed)
   Covid-19 Vaccination Clinic  Name:  Deborah Whitehead    MRN: [id number] DOB: July 08, 1951  11/17/2020  [name] was observed post Covid-19 immunization for 15 minutes without incident. She was provided with Vaccine Information Sheet and instruction to access the V-Safe system.   [name] was instructed to call 911 with any severe reactions post vaccine: Marland Kitchen Difficulty breathing  . Swelling of face and throat  . A fast heartbeat  . A bad rash all over body  . Dizziness and weakness   Immunizations Administered    Name Date Dose VIS Date Route   Pfizer COVID-19 Vaccine 11/17/2020 11:42 AM 0.3 mL 10/05/2020 Intramuscular   Manufacturer: [place]   Lot: [id number]   [place]: [id number]

## 2020-11-17 NOTE — Progress Notes (Signed)
Patient has accepted bed offer from [facility] notified Claiborne Billings at facility of acceptance.  CSW spoke with Tiffany at [facility] to initiate insurance authorization for SNF - case #[id number].  Madilyn Fireman, MSW, LCSW-A Transitions of Care  Clinical Social Worker I [facility] Emergency Departments  Medical ICU [phone]

## 2020-11-17 NOTE — Progress Notes (Signed)
Orthopedic Tech Progress Note Patient Details:  Deborah Whitehead 08-21-1951 [id number]  Ortho Devices Type of Ortho Device: CAM walker Ortho Device/Splint Location: LLE Ortho Device/Splint Interventions: Ordered, Application, Adjustment   Post Interventions Patient Tolerated: Fair Instructions Provided: Care of device, Adjustment of device, Poper ambulation with device   [name] 11/17/2020, 7:17 PM

## 2020-11-17 NOTE — ED Notes (Signed)
Incontinence care given to patient. She had a large BM. Purewick was replaced.

## 2020-11-17 NOTE — ED Notes (Signed)
Dinner Tray Ordered @ 1654. 

## 2020-11-18 DIAGNOSIS — M25561 Pain in right knee: Secondary | ICD-10-CM | POA: Diagnosis not present

## 2020-11-18 MED ORDER — OXYCODONE-ACETAMINOPHEN 7.5-325 MG PO TABS
1.0000 | ORAL_TABLET | Freq: Four times a day (QID) | ORAL | Status: DC | PRN
  Administered 2020-11-18 – 2020-11-22 (×16): 1 via ORAL
  Filled 2020-11-18 (×17): qty 1

## 2020-11-18 NOTE — ED Notes (Signed)
Breakfast Ordered 

## 2020-11-18 NOTE — ED Notes (Signed)
Pt acuity changed secondary to pt just waiting for placement. No need for more frequent vitals at this time.

## 2020-11-18 NOTE — ED Notes (Signed)
Pt sitting up and eating breakfast tray 

## 2020-11-18 NOTE — Progress Notes (Addendum)
Pt will be going to [facility] once insurance authorization has been obtained. CSW updated Pt about process.

## 2020-11-18 NOTE — ED Notes (Signed)
This RN and Clifton James EMT cleaned pt, changed full bed linen and gown. Pt verbalized comfort at this time.

## 2020-11-18 NOTE — ED Notes (Signed)
Ordered hospital bed 

## 2020-11-18 NOTE — Progress Notes (Signed)
CSW spoke with Ulice Dash at [facility] to initiate insurance authorization directly. CSW faxed clinicals to [phone]. Case reference #[id number].  Madilyn Fireman, MSW, LCSW-A Transitions of Care  Clinical Social Worker I [facility] Emergency Departments  Medical ICU [phone]

## 2020-11-19 DIAGNOSIS — M25561 Pain in right knee: Secondary | ICD-10-CM | POA: Diagnosis not present

## 2020-11-19 NOTE — ED Notes (Signed)
Breakfast Ordered 

## 2020-11-20 ENCOUNTER — Other Ambulatory Visit: Payer: Self-pay

## 2020-11-20 DIAGNOSIS — M25561 Pain in right knee: Secondary | ICD-10-CM | POA: Diagnosis not present

## 2020-11-20 NOTE — ED Notes (Signed)
Breakfast Ordered 

## 2020-11-20 NOTE — Discharge Planning (Signed)
Licensed Clinical Social Worker is seeking post-discharge placement for this patient at the following level of care: skilled nursing facility    

## 2020-11-21 DIAGNOSIS — M25561 Pain in right knee: Secondary | ICD-10-CM | POA: Diagnosis not present

## 2020-11-21 LAB — CBG MONITORING, ED: Glucose-Capillary: 141 mg/dL — ABNORMAL HIGH (ref 70–99)

## 2020-11-21 NOTE — Progress Notes (Signed)
TOC CM/CSW spoke with Whitney at [facility] in reference to authorization. Case reference #: [id number].  Authorization still pending.  Delice Lesch would be contact person for updates [phone], ext. [phone].  CSW will continue to follow for dc needs.  Ezri Landers Tarpley-Carter, MSW, LCSW-A Pronouns:  She, Her, Hers                  [facility] Transitions of CareClinical Social Worker [email or web address] [phone]

## 2020-11-21 NOTE — ED Notes (Signed)
Pt placed in recliner, X3 assist. Pts whole body cleaned, gown changed, linen changed.

## 2020-11-21 NOTE — ED Notes (Signed)
Dinner Trays Ordered @ [phone].

## 2020-11-21 NOTE — ED Notes (Signed)
Lunch Tray Ordered @ 1039. 

## 2020-11-21 NOTE — ED Notes (Signed)
Place a new external cath gave patient some apple juice patient is resting with call bell in reach

## 2020-11-21 NOTE — ED Notes (Signed)
Pt put back in bed. Assist X 3. Meal tray given to pt.

## 2020-11-21 NOTE — ED Notes (Signed)
Pt currently in Hospital Bed for comfort. Pt cleaned and bathed by previous staff. Pt clean and dry at this time. No concerns at this time from the patient. Call [place] at bedside and pt instructed to use call bell if assistance is needed.

## 2020-11-21 NOTE — Progress Notes (Signed)
Physical Therapy Treatment Patient Details Name: Deborah Whitehead MRN: [id number] DOB: 06-10-1951 Today's Date: 11/21/2020    History of Present Illness Pt is a 69 y/o female presenting to the ED following a fall. Imaging negative throughout RLE and LLE. PMH includes CVA and HTN.     PT Comments    Pt with slow progression towards goals. Remains anxious and is limited by pain. Refused OOB mobility, but was agreeable to HEP. Current recommendations appropriate. Will continue to follow acutely.    Follow Up Recommendations  SNF;Supervision/Assistance - 24 hour     Equipment Recommendations  Other (comment) (hoyer lift and hoyer pad)    Recommendations for Other Services       Precautions / Restrictions Precautions Precautions: Fall Precaution Comments: Pt reporting 2 recent falls.  Required Braces or Orthoses: Other Brace Other Brace: Pt now in a CAM boot on L ankle  Restrictions Weight Bearing Restrictions: Yes LLE Weight Bearing: Non weight bearing Other Position/Activity Restrictions: Per Pt she is NWB on LLE.     Mobility  Bed Mobility               General bed mobility comments: Pt refusing OOB mobility secondary to pain, but was agreeable to ther ex.   Transfers                    Ambulation/Gait                 Stairs             Wheelchair Mobility    Modified Rankin (Stroke Patients Only)       Balance                                            Cognition Arousal/Alertness: Awake/alert Behavior During Therapy: Anxious Overall Cognitive Status: No family/caregiver present to determine baseline cognitive functioning                                 General Comments: Pt continues to be anxious during mobility tasks.       Exercises General Exercises - Upper Extremity Shoulder Flexion: AROM;Both;10 reps;Supine Elbow Flexion: AROM;Both;10 reps;Limitations Elbow Flexion Limitations:  Limited ROM in LUE secondary to previous CVA General Exercises - Lower Extremity Ankle Circles/Pumps: AROM;Right;20 reps;Supine Quad Sets: AROM;Both;10 reps;Supine Heel Slides: AROM;Right;AAROM;Left;10 reps Hip ABduction/ADduction: AROM;Right;AAROM;Left;10 reps    General Comments        Pertinent Vitals/Pain Pain Assessment: Faces Faces Pain Scale: Hurts whole lot Pain Location: L ankle  Pain Descriptors / Indicators: Aching;Guarding;Grimacing;Moaning;Crying Pain Intervention(s): Limited activity within patient's tolerance;Monitored during session;Repositioned    Home Living                      Prior Function            PT Goals (current goals can now be found in the care plan section) Acute Rehab PT Goals Patient Stated Goal: to decrease pain  PT Goal Formulation: With patient Time For Goal Achievement: 11/30/20 Potential to Achieve Goals: Fair Progress towards PT goals: Progressing toward goals    Frequency    Min 2X/week      PT Plan Current plan remains appropriate    Co-evaluation  AM-PAC PT "6 Clicks" Mobility   Outcome Measure  Help needed turning from your back to your side while in a flat bed without using bedrails?: A Lot Help needed moving from lying on your back to sitting on the side of a flat bed without using bedrails?: A Lot Help needed moving to and from a bed to a chair (including a wheelchair)?: Total Help needed standing up from a chair using your arms (e.g., wheelchair or bedside chair)?: Total Help needed to walk in hospital room?: Total Help needed climbing 3-5 steps with a railing? : Total 6 Click Score: 8    End of Session   Activity Tolerance: Patient limited by pain Patient left: in bed;with call bell/phone within reach (in bed in ED ) Nurse Communication: Mobility status PT Visit Diagnosis: Unsteadiness on feet (R26.81);Muscle weakness (generalized) (M62.81)     Time: 8206-0156 PT Time  Calculation (min) (ACUTE ONLY): 10 min  Charges:  $Therapeutic Exercise: 8-22 mins                     Lou Miner, DPT  Acute Rehabilitation Services  Pager: [phone] Office: [phone]    Rudean Hitt 11/21/2020, 10:53 AM

## 2020-11-22 DIAGNOSIS — M25561 Pain in right knee: Secondary | ICD-10-CM | POA: Diagnosis not present

## 2020-11-22 NOTE — ED Provider Notes (Signed)
Pt holding for rehab facility.  Pt denies any current complaints.  Pt eating lunch.  She states she feels well. Vitals stable    Fransico Meadow, [place] 11/22/20 1354    Noemi Chapel, MD 11/23/20 [phone]

## 2020-11-22 NOTE — ED Notes (Signed)
Straighten patient up in the bed patient is sitting up eating breakfast

## 2020-11-22 NOTE — Progress Notes (Addendum)
3pm: Patient will go to room 3225 at [facility]. The number for report is [phone]. The patient will be transported via [place] arranged for transport.  1:45pm: CSW received call from [place] at [employer] authorization has been obtained.  CSW emailed patient's AVS to [facility] for review.  8:30am: CSW attempted to reach [place] at [phone], ext. [phone] - no answer, a voicemail was left requesting a return call.  Madilyn Fireman, MSW, LCSW-A Transitions of Care  Clinical Social Worker I [facility] Emergency Departments  Medical ICU [phone]

## 2020-11-22 NOTE — ED Notes (Signed)
Lunch Tray Ordered @ 1036. 

## 2020-12-26 ENCOUNTER — Ambulatory Visit (INDEPENDENT_AMBULATORY_CARE_PROVIDER_SITE_OTHER): Payer: Medicare HMO

## 2020-12-26 ENCOUNTER — Encounter: Payer: Self-pay | Admitting: Orthopedic Surgery

## 2020-12-26 ENCOUNTER — Ambulatory Visit (INDEPENDENT_AMBULATORY_CARE_PROVIDER_SITE_OTHER): Payer: Medicare HMO | Admitting: Orthopedic Surgery

## 2020-12-26 DIAGNOSIS — M6702 Short Achilles tendon (acquired), left ankle: Secondary | ICD-10-CM | POA: Diagnosis not present

## 2020-12-26 DIAGNOSIS — M79672 Pain in left foot: Secondary | ICD-10-CM

## 2020-12-26 NOTE — Progress Notes (Signed)
Office Visit Note   Patient: Deborah Whitehead           Date of Birth: 1951/12/12           MRN: [id number] Visit Date: 12/26/2020              Requested by: Lin Landsman, [place],  [place] [id number] PCP: Lin Landsman, MD  Chief Complaint  Patient presents with  . Left Foot - Pain      HPI: Patient is a 70 year old woman who was seen for evaluation of a avulsion chip off the medial malleolus.  Patient states she has had instability and falls.  She is status post a stroke in 2010 involving the left upper and left lower extremity.  Patient also has developed a blister ulcer beneath the left great toe.  Assessment & Plan: Visit Diagnoses:  1. Pain in left foot   2. Achilles tendon contracture, left     Plan: With patient's equinus contracture and hallux rigidus on the left with a stroke that involves the left upper and left lower extremity I do not feel that gait training would be beneficial.  Patient may be weightbearing for transfers on the left heel but gait training with weightbearing on the great toe will lead to ulceration.  Follow-Up Instructions: Return if symptoms worsen or fail to improve.   Ortho Exam  Patient is alert, oriented, no adenopathy, well-dressed, normal affect, normal respiratory effort. Examination patient has tenderness to palpation over the lateral malleolus consistent with an ankle sprain the medial malleolus with the old evulsion chip is not painful.  Patient has no active ankle plantarflexion or dorsiflexion or active toe range of motion.  There is a blister medially at the left great toe from weightbearing.  She has a significant Achilles contracture with 30 degrees of equinus contracture and hallux rigidus with dorsiflexion passively of only about 20 degrees.  A Doppler was used and she does have a triphasic dorsalis pedis pulse and  a monophasic posterior tibial pulse  Imaging: XR Foot 2 Views Left  Result Date: 12/26/2020 2  view radiographs of the left ankle shows a small old evulsion chip off the medial malleolus the mortise is congruent.  No images are attached to the encounter.  Labs: Lab Results  Component Value Date   HGBA1C 7.8 (H) 08/19/2020   HGBA1C 6.3 (H) 03/24/2014   HGBA1C 7.1 (H) 01/21/2014   ESRSEDRATE 43 (H) 01/21/2014   REPTSTATUS 11/17/2020 FINAL 11/16/2020   CULT (A) 11/16/2020    <10,000 COLONIES/mL INSIGNIFICANT GROWTH Performed at [facility], [place] [address], [place], [place] [address]      Lab Results  Component Value Date   ALBUMIN 3.2 (L) 11/16/2020   ALBUMIN 3.0 (L) 08/21/2020   ALBUMIN 2.8 (L) 08/21/2020    No results found for: MG No results found for: VD25OH  No results found for: PREALBUMIN CBC EXTENDED Latest Ref Rng & Units 11/16/2020 08/23/2020 08/22/2020  WBC 4.0 - 10.5 K/uL 7.1 5.0 4.6  RBC 3.87 - 5.11 MIL/uL 4.03 4.03 3.94  HGB 12.0 - 15.0 g/dL 12.0 11.5(L) 11.2(L)  HCT 36.0 - 46.0 % 38.6 38.1 36.6  PLT 150 - 400 K/uL 250 199 196  NEUTROABS 1.7 - 7.7 K/uL 4.7 - -  LYMPHSABS 0.7 - 4.0 K/uL 1.6 - -     There is no height or weight on file to calculate BMI.  Orders:  Orders Placed This Encounter  Procedures  . XR Foot 2 Views Left   No orders of the defined types were placed in this encounter.    Procedures: No procedures performed  Clinical Data: No additional findings.  ROS:  All other systems negative, except as noted in the HPI. Review of Systems  Objective: Vital Signs: There were no vitals taken for this visit.  Specialty Comments:  No specialty comments available.  PMFS History: Patient Active Problem List   Diagnosis Date Noted  . Weakness 08/19/2020  . TIA (transient ischemic attack) 03/23/2014  . Anxiety 03/23/2014  . CVA (cerebral vascular accident) ([place]) 07/15/2013  . Obesity, unspecified 07/13/2013  . Other and unspecified hyperlipidemia 07/13/2013  . Cigarette smoker 07/13/2013  . Stroke ([place]) 07/10/2013  . HTN  (hypertension) 07/10/2013  . Abdominal pain, unspecified site 05/13/2013   Past Medical History:  Diagnosis Date  . Anxiety   . Arthritis   . Depression   . Fibroids    abd  . GERD (gastroesophageal reflux disease)   . Hypertension    dr Ayesha Rumpf     immanuel fp  . Shortness of breath   . Stroke [facility])    memory loss  . Substance abuse ([place])     Family History  Problem Relation Age of Onset  . Cancer Mother        Breast  . Hypertension Mother   . Diabetes Mother   . Hyperlipidemia Mother   . Thyroid disease Mother   . Cancer Father        Prostate  . Asthma Brother     Past Surgical History:  Procedure Laterality Date  . ABDOMINAL HYSTERECTOMY    . CESAREAN SECTION    . CHOLECYSTECTOMY    . INSERTION OF MESH N/A 06/25/2013   Procedure: INSERTION OF MESH;  Surgeon: Merrie Roof, MD;  Location: [place];  Service: General;  Laterality: N/A;  . 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 LOOP RECORDER IMPLANT N/A 03/26/2014   Procedure: LOOP RECORDER IMPLANT;  Surgeon: Deboraha Sprang, MD;  Location: [facility] CATH LAB;  Service: Cardiovascular;  Laterality: N/A;  . TEE WITHOUT CARDIOVERSION N/A 03/25/2014   Procedure: TRANSESOPHAGEAL ECHOCARDIOGRAM (TEE);  Surgeon: Dorothy Spark, MD;  Location: [place];  Service: Cardiovascular;  Laterality: N/A;  . VENTRAL HERNIA REPAIR N/A 06/25/2013   Procedure: LAPAROSCOPIC VENTRAL HERNIA;  Surgeon: Merrie Roof, MD;  Location: [place];  Service: General;  Laterality: N/A;   Social History   Occupational History  . Not on file  Tobacco Use  . Smoking status: Former Smoker    Packs/day: 0.25    Years: 15.00    Pack years: 3.75    Quit date: 06/18/2013    Years since quitting: 7.5  . Smokeless tobacco: Never Used  Substance and Sexual Activity  . Alcohol use: No    Comment: stopped  . Drug use: Yes    Types: Cocaine    Comment: stopped x 1 yr  . Sexual activity: Not on file

## 2021-07-24 ENCOUNTER — Ambulatory Visit: Payer: Medicare Other | Admitting: Podiatry

## 2021-07-28 ENCOUNTER — Ambulatory Visit (INDEPENDENT_AMBULATORY_CARE_PROVIDER_SITE_OTHER): Payer: Medicare Other | Admitting: Podiatry

## 2021-07-28 ENCOUNTER — Other Ambulatory Visit: Payer: Self-pay

## 2021-07-28 DIAGNOSIS — M79675 Pain in left toe(s): Secondary | ICD-10-CM

## 2021-07-28 DIAGNOSIS — B351 Tinea unguium: Secondary | ICD-10-CM | POA: Diagnosis not present

## 2021-07-28 DIAGNOSIS — M79674 Pain in right toe(s): Secondary | ICD-10-CM | POA: Diagnosis not present

## 2021-07-28 DIAGNOSIS — M7752 Other enthesopathy of left foot: Secondary | ICD-10-CM

## 2021-08-02 ENCOUNTER — Encounter: Payer: Self-pay | Admitting: Podiatry

## 2021-08-02 NOTE — Progress Notes (Signed)
  Subjective:  Patient ID: Deborah Whitehead, female    DOB: 04/22/1951,  MRN: [id number]  Chief Complaint  Patient presents with   Nail Problem    New patient, routine foot care nail trim and callus trim    70 y.o. female returns for the above complaint. Patient presents with complaint of thickened elongated dystrophic toenails x10.  Patient stated painful to touch painful to walk on.  She would like for me to debride them down she is not able to do it herself.  She has secondary complaint of left ankle pain and pain with range of motion of the ankle joint.  She states it came out of nowhere has progressive gotten worse.  She does have a history of arthritis.  She would like to know if she can get a steroid injection.  She denies any other acute complaints.  Objective:  There were no vitals filed for this visit. Podiatric Exam: Vascular: dorsalis pedis and posterior tibial pulses are palpable bilateral. Capillary return is immediate. Temperature gradient is WNL. Skin turgor WNL  Sensorium: Normal Semmes Weinstein monofilament test. Normal tactile sensation bilaterally. Nail Exam: Pt has thick disfigured discolored nails with subungual debris noted bilateral entire nail hallux through fifth toenails.  Pain on palpation to the nails. Ulcer Exam: There is no evidence of ulcer or pre-ulcerative changes or infection. Orthopedic Exam: Muscle tone and strength are WNL. No limitations in general ROM. No crepitus or effusions noted. HAV  B/L.  Hammer toes 2-5  B/L.  Pain on palpation to the left ankle joint.  At the lateral gutter.  Pain with range of motion of the ankle joint.  Deep intra-articular pain noted. Skin: No Porokeratosis. No infection or ulcers  Radiographs left ankle prior: 3 views of skeletally mature adult previous left ankle x-ray noted osteoarthritic changes noted at the ankle joint.  No other bony abnormalities identified.  Assessment & Plan:   1. Capsulitis of ankle, left   2.  Pain due to onychomycosis of toenails of both feet     Patient was evaluated and treated and all questions answered.  Left ankle capsulitis -I explained patient the etiology of capsulitis and various treatment options were extensively discussed.  Given the amount of pain that she is having I believe she will benefit from steroid injection of decrease acute inflammatory component associate with pain.  Patient agrees with plan like to proceed with a steroid injection. -A steroid injection was performed at left ankle joint using 1% plain Lidocaine and 10 mg of Kenalog. This was well tolerated.   Onychomycosis with pain  -Nails palliatively debrided as below. -Educated on self-care  Procedure: Nail Debridement Rationale: pain  Type of Debridement: manual, sharp debridement. Instrumentation: Nail nipper, rotary burr. Number of Nails: 10  Procedures and Treatment: Consent by patient was obtained for treatment procedures. The patient understood the discussion of treatment and procedures well. All questions were answered thoroughly reviewed. Debridement of mycotic and hypertrophic toenails, 1 through 5 bilateral and clearing of subungual debris. No ulceration, no infection noted.  Return Visit-Office Procedure: Patient instructed to return to the office for a follow up visit 3 months for continued evaluation and treatment.  Boneta Lucks, DPM    Return for routine foot care .

## 2021-11-03 ENCOUNTER — Ambulatory Visit (INDEPENDENT_AMBULATORY_CARE_PROVIDER_SITE_OTHER): Payer: Medicare Other | Admitting: Podiatry

## 2021-11-03 ENCOUNTER — Other Ambulatory Visit: Payer: Self-pay

## 2021-11-03 DIAGNOSIS — E119 Type 2 diabetes mellitus without complications: Secondary | ICD-10-CM

## 2021-11-03 DIAGNOSIS — I96 Gangrene, not elsewhere classified: Secondary | ICD-10-CM | POA: Diagnosis not present

## 2021-11-03 DIAGNOSIS — F209 Schizophrenia, unspecified: Secondary | ICD-10-CM | POA: Insufficient documentation

## 2021-11-03 DIAGNOSIS — I999 Unspecified disorder of circulatory system: Secondary | ICD-10-CM

## 2021-11-03 DIAGNOSIS — K219 Gastro-esophageal reflux disease without esophagitis: Secondary | ICD-10-CM | POA: Insufficient documentation

## 2021-11-03 DIAGNOSIS — J309 Allergic rhinitis, unspecified: Secondary | ICD-10-CM | POA: Insufficient documentation

## 2021-11-03 DIAGNOSIS — F32A Depression, unspecified: Secondary | ICD-10-CM | POA: Insufficient documentation

## 2021-11-03 NOTE — Progress Notes (Deleted)
ANNUAL DIABETIC FOOT EXAM  Subjective: Deborah Whitehead presents today for {jgcomplaint:23593}.  Patient relates *** year h/o diabetes.  Patient *** h/o foot wounds.  Patient *** symptoms of foot numbness.  Patient *** symptoms of foot tingling.  Patient *** symptoms of burning in feet.  Patient *** symptoms of pins/needles in feet.  Patient denies any numbness, tingling, burning, or pins/needle sensation in feet.  Patient has been diagnosed with neuropathy and it is managed with ***.  Patient's blood sugar was *** mg/dl ***. Patient did not check blood glucose this morning.  Patient does not monitor blood glucose daily.  Deborah Landsman, MD is patient's PCP. Last visit was ***.  Past Medical History:  Diagnosis Date   Anxiety    Arthritis    Depression    Fibroids    abd   GERD (gastroesophageal reflux disease)    Hypertension    dr Ayesha Rumpf     immanuel fp   Shortness of breath    Stroke [facility])    memory loss   Substance abuse [facility])    Patient Active Problem List   Diagnosis Date Noted   Allergic rhinitis 11/03/2021   Depression 11/03/2021   Esophageal reflux 11/03/2021   Schizophrenia ([place]) 11/03/2021   Weakness 08/19/2020   Left-sided weakness 12/18/2016   Status post CVA 12/18/2016   Rash 10/02/2016   Blood sugar increased 10/01/2016   Insomnia 10/01/2016   Type 2 diabetes mellitus without complication, without long-term current use of insulin ([place]) 10/01/2016   GAD (generalized anxiety disorder) 08/04/2014   MDD (major depressive disorder), recurrent episode, moderate ([place]) 08/04/2014   TIA (transient ischemic attack) 03/23/2014   Anxiety 03/23/2014   CVA (cerebral vascular accident) ([place]) 07/15/2013   Obesity, unspecified 07/13/2013   Other and unspecified hyperlipidemia 07/13/2013   Cigarette smoker 07/13/2013   Stroke ([place]) 07/10/2013   HTN (hypertension) 07/10/2013   Abdominal pain, unspecified site 05/13/2013   Macular hole, right eye 05/30/2011    Past Surgical History:  Procedure Laterality Date   ABDOMINAL HYSTERECTOMY     CESAREAN SECTION     CHOLECYSTECTOMY     INSERTION OF MESH N/A 06/25/2013   Procedure: INSERTION OF MESH;  Surgeon: Merrie Roof, MD;  Location: [place];  Service: General;  Laterality: N/A;   LOOP RECORDER IMPLANT  03-26-14   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   LOOP RECORDER IMPLANT N/A 03/26/2014   Procedure: LOOP RECORDER IMPLANT;  Surgeon: Deboraha Sprang, MD;  Location: [facility] CATH LAB;  Service: Cardiovascular;  Laterality: N/A;   TEE WITHOUT CARDIOVERSION N/A 03/25/2014   Procedure: TRANSESOPHAGEAL ECHOCARDIOGRAM (TEE);  Surgeon: Dorothy Spark, MD;  Location: [place];  Service: Cardiovascular;  Laterality: N/A;   VENTRAL HERNIA REPAIR N/A 06/25/2013   Procedure: LAPAROSCOPIC VENTRAL HERNIA;  Surgeon: Luella Cook III, MD;  Location: [place];  Service: General;  Laterality: N/A;   Current Outpatient Medications on File Prior to Visit  Medication Sig Dispense Refill   acetaminophen-codeine (TYLENOL #3) 300-30 MG tablet Take 1 tablet by mouth every 4 (four) hours as needed for moderate pain.     amLODipine (NORVASC) 5 MG tablet Take 5 mg by mouth daily.     atorvastatin (LIPITOR) 80 MG tablet Take 1 tablet (80 mg total) by mouth daily at 6 PM. 30 tablet 0   buPROPion (WELLBUTRIN XL) 150 MG 24 hr tablet Take 150 mg by mouth daily as needed.     clopidogrel (  PLAVIX) 75 MG tablet Take 1 tablet (75 mg total) by mouth daily with breakfast. 30 tablet 1   DULoxetine (CYMBALTA) 60 MG capsule Take by mouth.     ELDERBERRY PO Take 1 tablet by mouth daily.     furosemide (LASIX) 20 MG tablet Take 20 mg by mouth daily.     gabapentin (NEURONTIN) 100 MG capsule Take 100 mg by mouth 3 (three) times daily.     LORazepam (ATIVAN) 0.5 MG tablet Take 0.5 mg by mouth 2 (two) times daily as needed.     losartan (COZAAR) 100 MG tablet Take 100 mg by mouth daily.     metFORMIN (GLUCOPHAGE-XR) 500 MG 24 hr  tablet Take 500 mg by mouth daily.     omeprazole (PRILOSEC) 20 MG capsule Take 20 mg by mouth daily.     warfarin (COUMADIN) 5 MG tablet Take by mouth.     No current facility-administered medications on file prior to visit.    No Known Allergies Social History   Occupational History   Not on file  Tobacco Use   Smoking status: Former    Packs/day: 0.25    Years: 15.00    Pack years: 3.75    Types: Cigarettes    Quit date: 06/18/2013    Years since quitting: 8.3   Smokeless tobacco: Never  Substance and Sexual Activity   Alcohol use: No    Comment: stopped   Drug use: Yes    Types: Cocaine    Comment: stopped x 1 yr   Sexual activity: Not on file   Family History  Problem Relation Age of Onset   Cancer Mother        Breast   Hypertension Mother    Diabetes Mother    Hyperlipidemia Mother    Thyroid disease Mother    Cancer Father        Prostate   Asthma Brother    Immunization History  Administered Date(s) Administered   PFIZER(Purple Top)SARS-COV-2 Vaccination 08/23/2020, 11/17/2020     Review of Systems: Negative except as noted in the HPI.   Objective: There were no vitals filed for this visit.  Deborah Whitehead is a pleasant 70 y.o. female in NAD. AAO X 3.  Vascular Examination: {jgvascular:23595}  Dermatological Examination: {jgderm:23598}  Musculoskeletal Examination: {jgmsk:23600::"Normal muscle strength 5/5 to all lower extremity muscle groups bilaterally. No pain, crepitus or joint limitation noted with ROM b/l LE. No gross bony pedal deformities b/l. Patient ambulates independently without assistive aids."}  Footwear Assessment: Does the patient wear appropriate shoes? {Yes,No}. Does the patient need inserts/orthotics? {Yes,No}.  Neurological Examination: {jgneuro:23601::"Protective sensation intact 5/5 intact bilaterally with 10g monofilament b/l.","Vibratory sensation intact b/l.","Proprioception intact bilaterally."}  No flowsheet  data found.   Assessment: No diagnosis found.   ADA Risk Categorization: Low Risk :  Patient has all of the following: Intact protective sensation No prior foot ulcer  No severe deformity Pedal pulses present  High Risk  Patient has one or more of the following: Loss of protective sensation Absent pedal pulses Severe Foot deformity History of foot ulcer  Plan: {jgplan:23602::"-Patient/POA to call should there be question/concern in the interim."}  No follow-ups on file.  Deborah Whitehead, Deborah Whitehead

## 2021-11-07 ENCOUNTER — Telehealth: Payer: Self-pay | Admitting: Podiatry

## 2021-11-07 NOTE — Progress Notes (Signed)
Subjective:  Patient ID: Deborah Whitehead, female    DOB: [birth year],  MRN: [id number]  Chief Complaint  Patient presents with   Routine Foot Care    Patient presents for routine Diabetic foot care. Her foot is discolored, cold and peeling. She states that she does not have much feeling in her feet.    70 y.o. female presents with the above complaint.  Patient presents with complaint of left plantar hallux superficial gangrene.  Patient states she started noticing discoloration.  She was here to see Dr. Carney Corners for routine foot care however her foot has discolored cold peeling.  She wanted get it evaluated.  I saw her in clinic today.  She does not have any vascular history that she is aware of.  She is a diabetic with last A1c of 7.8.  She has not seen anyone else prior to seeing me.   Review of Systems: Negative except as noted in the HPI. Denies N/V/F/Ch.  Past Medical History:  Diagnosis Date   Anxiety    Arthritis    Depression    Fibroids    abd   GERD (gastroesophageal reflux disease)    Hypertension    dr Ayesha Rumpf     immanuel fp   Shortness of breath    Stroke ([place])    memory loss   Substance abuse ([facility])     Current Outpatient Medications:    acetaminophen-codeine (TYLENOL #3) 300-30 MG tablet, Take 1 tablet by mouth every 4 (four) hours as needed for moderate pain., Disp: , Rfl:    amLODipine (NORVASC) 5 MG tablet, Take 5 mg by mouth daily., Disp: , Rfl:    atorvastatin (LIPITOR) 80 MG tablet, Take 1 tablet (80 mg total) by mouth daily at 6 PM., Disp: 30 tablet, Rfl: 0   buPROPion (WELLBUTRIN XL) 150 MG 24 hr tablet, Take 150 mg by mouth daily as needed., Disp: , Rfl:    clopidogrel (PLAVIX) 75 MG tablet, Take 1 tablet (75 mg total) by mouth daily with breakfast., Disp: 30 tablet, Rfl: 1   DULoxetine (CYMBALTA) 60 MG capsule, Take by mouth., Disp: , Rfl:    ELDERBERRY PO, Take 1 tablet by mouth daily., Disp: , Rfl:    furosemide (LASIX) 20 MG tablet, Take 20 mg by mouth  daily., Disp: , Rfl:    gabapentin (NEURONTIN) 100 MG capsule, Take 100 mg by mouth 3 (three) times daily., Disp: , Rfl:    LORazepam (ATIVAN) 0.5 MG tablet, Take 0.5 mg by mouth 2 (two) times daily as needed., Disp: , Rfl:    losartan (COZAAR) 100 MG tablet, Take 100 mg by mouth daily., Disp: , Rfl:    metFORMIN (GLUCOPHAGE-XR) 500 MG 24 hr tablet, Take 500 mg by mouth daily., Disp: , Rfl:    omeprazole (PRILOSEC) 20 MG capsule, Take 20 mg by mouth daily., Disp: , Rfl:    warfarin (COUMADIN) 5 MG tablet, Take by mouth., Disp: , Rfl:   Social History   Tobacco Use  Smoking Status Former   Packs/day: 0.25   Years: 15.00   Pack years: 3.75   Types: Cigarettes   Quit date: 06/18/2013   Years since quitting: 8.3  Smokeless Tobacco Never    No Known Allergies Objective:  There were no vitals filed for this visit. There is no height or weight on file to calculate BMI. Constitutional Well developed. Well nourished.  Vascular Dorsalis pedis pulses non palpable bilaterally. Posterior tibial pulses non palpable bilaterally. Capillary refill sluggish  to all digits No cyanosis or clubbing noted. Pedal hair growth not present  Neurologic Normal speech. Oriented to person, place, and time. Epicritic sensation to light touch grossly present bilaterally.  Dermatologic Dry stable gangrene noted to left plantar hallux.  No erythema no purulent drainage noted.  Thick hard fibrotic Noted.  No wet gangrene noted.  No cellulitis noted.  No malodor present.  Orthopedic: Normal joint ROM without pain or crepitus bilaterally. No visible deformities. No bony tenderness.   Radiographs: None Assessment:   1. Vascular abnormality   2. Gangrene of left foot ([place])   3. Type 2 diabetes mellitus without complication, without long-term current use of insulin ([place])    Plan:  Patient was evaluated and treated and all questions answered.  Left plantar hallux superficial gangrene dry stable with underlying  vascular abnormality -I explained to the patient the etiology of gangrene and various treatment options were discussed.  Given that this is an acute signs of gangrene present I believe patient will benefit from ABIs PVRs to assess the vascular flow to the left lower extremity.  Clinically I am not able to appreciate palpable pulses.  At this time is dry stable and without any clinical signs of infection.  I will hold off on any antibiotics.  I discussed with her that if it gets worse or becomes more wet I have asked her to go to the emergency room right away.  She states understanding -ABIs PVRs were ordered   No follow-ups on file.

## 2021-11-07 NOTE — Telephone Encounter (Signed)
Dawn from [facility] called she thinks the wrong orders were given to this patient, this patient does not have anything wrong with her right foot?  They do not want to order testing on this patient if these are the wrong orders.  Their physician  checked over the patient and could not find anything of concern on the right side.  Please advise   Dawn cell phone [phone]

## 2021-11-08 ENCOUNTER — Telehealth: Payer: Self-pay | Admitting: Podiatry

## 2021-11-08 ENCOUNTER — Telehealth: Payer: Self-pay | Admitting: *Deleted

## 2021-11-08 NOTE — Telephone Encounter (Signed)
[facility] called stating they called patient on Monday to schedule and appt to be seen with them. Patient answered twice and hung up. They are stating they will no longer be able to service her. The order has been sent back with further information. Please advise.

## 2021-11-08 NOTE — Telephone Encounter (Signed)
[name] is having a hard time reaching patient to schedule appointment. Called and spoke with daughter, said that patient is in a nursing facility([name]) and to contact them to schedule.Returned the call [name],exchanging both numbers with them to contact.

## 2021-11-15 ENCOUNTER — Ambulatory Visit (HOSPITAL_COMMUNITY)
Admission: RE | Admit: 2021-11-15 | Discharge: 2021-11-15 | Disposition: A | Discharge status: Home / Self Care | Payer: Medicare Other | Source: Ambulatory Visit | Attending: Podiatry | Admitting: Podiatry

## 2021-11-15 ENCOUNTER — Other Ambulatory Visit: Payer: Self-pay

## 2021-11-15 DIAGNOSIS — I999 Unspecified disorder of circulatory system: Secondary | ICD-10-CM | POA: Insufficient documentation

## 2021-12-01 ENCOUNTER — Other Ambulatory Visit: Payer: Self-pay

## 2021-12-01 ENCOUNTER — Ambulatory Visit (INDEPENDENT_AMBULATORY_CARE_PROVIDER_SITE_OTHER): Payer: Medicare Other | Admitting: Podiatry

## 2021-12-01 DIAGNOSIS — I999 Unspecified disorder of circulatory system: Secondary | ICD-10-CM

## 2021-12-01 DIAGNOSIS — E119 Type 2 diabetes mellitus without complications: Secondary | ICD-10-CM

## 2021-12-01 DIAGNOSIS — I96 Gangrene, not elsewhere classified: Secondary | ICD-10-CM

## 2021-12-05 NOTE — Progress Notes (Signed)
Subjective:  Patient ID: Deborah Whitehead, female    DOB: Sep 02, 1951,  MRN: [id number]  Chief Complaint  Patient presents with   Wound Check    Left foot     70 y.o. female presents with the above complaint.  Patient presents for follow-up to left hallux gangrene superficial.  Patient states is looking a lot better.  She had a vascular study done for which he would like to go the results.  She does not follow-up with her vascular doctor.  She denies any other acute complaints.   Review of Systems: Negative except as noted in the HPI. Denies N/V/F/Ch.  Past Medical History:  Diagnosis Date   Anxiety    Arthritis    Depression    Fibroids    abd   GERD (gastroesophageal reflux disease)    Hypertension    dr Ayesha Rumpf     immanuel fp   Shortness of breath    Stroke ([place])    memory loss   Substance abuse (HCC)     Current Outpatient Medications:    acetaminophen-codeine (TYLENOL #3) 300-30 MG tablet, Take 1 tablet by mouth every 4 (four) hours as needed for moderate pain., Disp: , Rfl:    amLODipine (NORVASC) 5 MG tablet, Take 5 mg by mouth daily., Disp: , Rfl:    atorvastatin (LIPITOR) 80 MG tablet, Take 1 tablet (80 mg total) by mouth daily at 6 PM., Disp: 30 tablet, Rfl: 0   buPROPion (WELLBUTRIN XL) 150 MG 24 hr tablet, Take 150 mg by mouth daily as needed., Disp: , Rfl:    clopidogrel (PLAVIX) 75 MG tablet, Take 1 tablet (75 mg total) by mouth daily with breakfast., Disp: 30 tablet, Rfl: 1   DULoxetine (CYMBALTA) 60 MG capsule, Take by mouth., Disp: , Rfl:    ELDERBERRY PO, Take 1 tablet by mouth daily., Disp: , Rfl:    furosemide (LASIX) 20 MG tablet, Take 20 mg by mouth daily., Disp: , Rfl:    gabapentin (NEURONTIN) 100 MG capsule, Take 100 mg by mouth 3 (three) times daily., Disp: , Rfl:    LORazepam (ATIVAN) 0.5 MG tablet, Take 0.5 mg by mouth 2 (two) times daily as needed., Disp: , Rfl:    losartan (COZAAR) 100 MG tablet, Take 100 mg by mouth daily., Disp: , Rfl:     metFORMIN (GLUCOPHAGE-XR) 500 MG 24 hr tablet, Take 500 mg by mouth daily., Disp: , Rfl:    omeprazole (PRILOSEC) 20 MG capsule, Take 20 mg by mouth daily., Disp: , Rfl:    warfarin (COUMADIN) 5 MG tablet, Take by mouth., Disp: , Rfl:   Social History   Tobacco Use  Smoking Status Former   Packs/day: 0.25   Years: 15.00   Pack years: 3.75   Types: Cigarettes   Quit date: 06/18/2013   Years since quitting: 8.4  Smokeless Tobacco Never    No Known Allergies Objective:  There were no vitals filed for this visit. There is no height or weight on file to calculate BMI. Constitutional Well developed. Well nourished.  Vascular Dorsalis pedis pulses non palpable bilaterally. Posterior tibial pulses non palpable bilaterally. Capillary refill sluggish to all digits No cyanosis or clubbing noted. Pedal hair growth not present  Neurologic Normal speech. Oriented to person, place, and time. Epicritic sensation to light touch grossly present bilaterally.  Dermatologic Dry stable gangrene noted to left plantar hallux that is improving and mostly resolving.  No erythema no purulent drainage noted.    No  wet gangrene noted.  No cellulitis noted.  No malodor present.  Orthopedic: Normal joint ROM without pain or crepitus bilaterally. No visible deformities. No bony tenderness.   Radiographs: None Assessment:   1. Vascular abnormality   2. Gangrene of left foot ([place])   3. Type 2 diabetes mellitus without complication, without long-term current use of insulin ([place])     Plan:  Patient was evaluated and treated and all questions answered.  Left plantar hallux g that is healing and mostly resolving -I explained to the patient the etiology of gangrene and various treatment options were discussed.  -ABIs PVRs were discussed with the patient.  She does have abnormality in the vascular flow.  She would benefit from a consult with Dr. Corrie Mckusick for further evaluation and management.  She states  understanding would like to proceed with that -She is also experiencing some claudication type of pain and symptoms in the leg as well for which she will benefit from vascular flow assessment as well. -At this time I will defer further management to vascular.  I will continue seeing her from [place] especially if the wound gets worsen.  She states understanding.  No follow-ups on file.

## 2021-12-12 ENCOUNTER — Other Ambulatory Visit: Payer: Self-pay | Admitting: Podiatry

## 2021-12-12 DIAGNOSIS — I96 Gangrene, not elsewhere classified: Secondary | ICD-10-CM

## 2021-12-21 ENCOUNTER — Ambulatory Visit
Admission: RE | Admit: 2021-12-21 | Discharge: 2021-12-21 | Disposition: A | Discharge status: Home / Self Care | Payer: Medicare Other | Source: Ambulatory Visit | Attending: Podiatry | Admitting: Podiatry

## 2021-12-21 ENCOUNTER — Encounter: Payer: Self-pay | Admitting: *Deleted

## 2021-12-21 DIAGNOSIS — I96 Gangrene, not elsewhere classified: Secondary | ICD-10-CM

## 2021-12-21 HISTORY — PX: IR RADIOLOGIST EVAL & MGMT: IMG5224

## 2021-12-21 NOTE — Consult Note (Signed)
Chief Complaint: Left foot wound  Referring Physician(s): Patel,Kevin P  History of Present Illness: Deborah Whitehead is a 71 y.o. female presenting today to Vascular & Interventional Radiology, kindly referred by Dr. Posey Pronto of [facility], for evaluation of left foot wound.    Deborah Whitehead is here today in the clinic by herself and is somewhat of a poor historian.  We did get to speak with her daughter on the phone during our interview, and she provided some additional history.   Deborah Whitehead tells me that about a month ago she had a wound develop on the left great toe, which has been getting cared for by Dr. Posey Pronto.  She endorses developing pain in her foot over the course of months, with a parallel deterioration in her walking frequency.  Currently, she is only using a wheelchair, she says because of left foot pain.   She denies any remote history of foot wounds.     She denies any resting chest pain, history of MI. She has history of a stroke, with residual left hand contracture/weakness.    She has history of prior tobacco use, HTN.    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   LOOP RECORDER IMPLANT N/A 03/26/2014   Procedure: LOOP RECORDER IMPLANT;  Surgeon: Deboraha Sprang, MD;  Location: [facility] C[facility] LAB;  Service: Cardiovascular;  Laterality: N/A;   TEE WITHOUT CARDIOVERSION N/A 03/25/2014    Procedure: TRANSESOPHAGEAL ECHOCARDIOGRAM (TEE);  Surgeon: Dorothy Spark, MD;  Location: [place];  Service: Cardiovascular;  Laterality: N/A;   VENTRAL HERNIA REPAIR N/A 06/25/2013   Procedure: LAPAROSCOPIC VENTRAL HERNIA;  Surgeon: Merrie Roof, MD;  Location: [facility];  Service: General;  Laterality: N/A;    Allergies: Patient has no known allergies.  Medications: Prior to Admission medications   Medication Sig Start Date End Date Taking? Authorizing Provider  acetaminophen-codeine (TYLENOL #3) 300-30 MG tablet Take 1 tablet by mouth every 4 (four) hours as needed for moderate pain.    [provider]  amLODipine (NORVASC) 5 MG tablet Take 5 mg by mouth daily. 08/05/20   [provider]  atorvastatin (LIPITOR) 80 MG tablet Take 1 tablet (80 mg total) by mouth daily at 6 PM. 08/23/20   Andrew Au, MD  buPROPion (WELLBUTRIN XL) 150 MG 24 hr tablet Take 150 mg by mouth daily as needed. 10/14/21   [provider]  clopidogrel (PLAVIX) 75 MG tablet Take 1 tablet (75 mg total) by mouth daily with breakfast. 07/21/13   Angiulli, Lavon Paganini, PA-C  DULoxetine (CYMBALTA) 60 MG capsule Take by mouth. 10/24/21   [provider]  ELDERBERRY PO Take 1 tablet by  mouth daily.    [provider]  furosemide (LASIX) 20 MG tablet Take 20 mg by mouth daily. 08/05/20   [provider]  gabapentin (NEURONTIN) 100 MG capsule Take 100 mg by mouth 3 (three) times daily. 10/28/20   [provider]  LORazepam (ATIVAN) 0.5 MG tablet Take 0.5 mg by mouth 2 (two) times daily as needed. 05/23/21   [provider]  losartan (COZAAR) 100 MG tablet Take 100 mg by mouth daily.    [provider]  metFORMIN (GLUCOPHAGE-XR) 500 MG 24 hr tablet Take 500 mg by mouth daily.    [provider]  omeprazole (PRILOSEC) 20 MG capsule Take 20 mg by mouth daily. 10/28/20   [provider]  warfarin (COUMADIN) 5 MG tablet Take by mouth.  05/17/21   [provider]     Family History  Problem Relation Age of Onset   Cancer Mother        Breast   Hypertension Mother    Diabetes Mother    Hyperlipidemia Mother    Thyroid disease Mother    Cancer Father        Prostate   Asthma Brother     Social History   Socioeconomic History   Marital status: Widowed    Spouse name: Not on file   Number of children: Not on file   Years of education: Not on file   Highest education level: Not on file  Occupational History   Not on file  Tobacco Use   Smoking status: Former    Packs/day: 0.25    Years: 15.00    Pack years: 3.75    Types: Cigarettes    Quit date: 06/18/2013    Years since quitting: 8.5   Smokeless tobacco: Never  Substance and Sexual Activity   Alcohol use: No    Comment: stopped   Drug use: Yes    Types: Cocaine    Comment: stopped x 1 yr   Sexual activity: Not on file  Other Topics Concern   Not on file  Social History Narrative   Not on file   Social Determinants of Health   Financial Resource Strain: Not on file  Food Insecurity: Not on file  Transportation Needs: Not on file  Physical Activity: Not on file  Stress: Not on file  Social Connections: Not on file       Review of Systems: A 12 point ROS discussed and pertinent positives are indicated in the HPI above.  All other systems are negative.  Review of Systems  Vital Signs: Pulse 82    SpO2 98%   Physical Exam General: 71 yo female appearing stated age.  Well-developed, well-nourished.  No distress. HEENT: Atraumatic, normocephalic.  Conjugate gaze, extra-ocular motor intact. No scleral icterus or scleral injection. No lesions on external ears, nose, lips, or gums.  Oral mucosa moist, pink.  Neck: Symmetric with no goiter enlargement.  Chest/Lungs:  Symmetric chest with inspiration/expiration.  No labored breathing.  Clear to auscultation with no wheezes, rhonchi, or rales.  Heart:  RRR, with no third heart sounds  appreciated. No JVD appreciated.  Abdomen:  Soft, NT/ND, with + bowel sounds.   Genito-urinary: Deferred Neurologic/MSK:  She is oriented, with some delayed speech. Left hand contracture.  Symmetric strength of the upper extremity.  Decreased strength of bilateral hips and knees.  Pulse Exam:  Doppler positive right and left AT and PT.  Extremities: The left hallux wound has healed.  Small ~2mm  wound on the medial left 3rd toe intertriginous surface, involving skin, with distal digit darknening. Small ~56mm wound on the medial left 5rd toe intertriginous surface, involving skin. Dry flaking skin, with hypertrophic nail changes     Mallampati Score:     Imaging: No results found.  Labs:  CBC: No results for input(s): WBC, HGB, HCT, PLT in the last 8760 hours.  COAGS: No results for input(s): INR, APTT in the last 8760 hours.  BMP: No results for input(s): NA, K, CL, CO2, GLUCOSE, BUN, CALCIUM, CREATININE, GFRNONAA, GFRAA in the last 8760 hours.  Invalid input(s): CMP  LIVER FUNCTION TESTS: No results for input(s): BILITOT, AST, ALT, ALKPHOS, PROT, ALBUMIN in the last 8760 hours.  TUMOR MARKERS: No results for input(s): AFPTM, CEA, CA199, CHROMGRNA in the last 8760 hours.  Assessment and Plan:  Assessment:  Deborah Whitehead is a 44 presenting with left lower extremity CLI/CLTI, with Ruther[place] 5 category symptoms of PAD.    Non-invasive lower extremity exam shows at least tibial disease bilateral, with falsely elevated ABI.    I had a discussion with Deborah Whitehead and her daughter regarding anatomy, pathology/pathophysiology, natural history, and prognosis of PAD/CLI.  Informed consent regarding treatment strategies was performed which would possibly include medical management, and/or endovascular options, with risk/benefit discussion.  The indications for treatment supported by updated guidelines1, 2 were discussed.  Regarding endovascular options, specific risks discussed include:  bleeding, infection, contrast reaction, renal injury/nephropathy, arterial injury/dissection, need for additional procedure/surgery, worsening symptoms/tissue including limb loss, cardiopulmonary collapse, death.    I reinforced the role of medical management, maximal medical therapy for reduction of risk factors is indicated as recommended by updated AHA guidelines1.  This includes anti-platelet medication, tight blood glucose control to a HbA1c < 7, tight blood pressure control, maximum-dose HMG-CoA reductase inhibitor.  Today, they seem a bit on the fence to decide for any procedure.   I did reassure both Deborah Whitehead and her daughter that she is not at imminent need of any amputation, and that the prior left great toe wound healed.  She has resting pain and ongoing small wounds of the 3rd and 5th toes, but these may heal. I have encouraged them to take today's information and discuss.  We are available if she has any new wounds or deterioration.   Annual flu vaccination is also recommended, with Class 1 recommendation1.   Plan: - Strategy is conservative at this time, with 4 months office visit follow up to assure healing, as they remain undecided about desire for any procedure at this time.  - Notes to her assisted living reflect that they may call our office if there are any new wounds that develop and we can get her in for another appointment to discuss.  - Continue maximal medical therapy for cardiovascular risk reduction, including anti-platelet therapy.    ___________________________________________________________________   1Morley Kos MD, et al. 2016 AHA/ACC Guideline on the Management of Patients With Lower Extremity Peripheral Artery Disease: Executive Summary: A Report of the American College of Cardiology/American Heart Association Task Force on Clinical Practice Guidelines. J Am Coll Cardiol. 2017 Mar 21;69(11):1465-1508. doi: 10.1016/j.jacc.2016.11.008.   2 - Norgren L,  et al. TASC II Working Group. Inter-society consensus for the management of peripheral arterial disease. Int Tressia Miners. 2007 Jun;26(2):81-157. Review. PubMed PMID: 66294765  3 - Hingorani A, et al. The management of diabetic foot: A clinical practice guideline by the Society for Vascular Surgery in collaboration with the [place] and  the Society  for Vascular Medicine. J Vasc Surg. 2016 Feb;63(2 Suppl):3S-21S. doi: 10.1016/j.jvs.2015.10.003. PubMed PMID: 57473403.  4 - Corinna Gab, Saab FA, Luberta Mutter, Grant Ruts, Ewell Poe, Driver VR, Nordic, Lookstein R, van den Baldemar Lenis, Jaff MR, Guadalupe Dawn, Henao S, AlMahameed A, Katzen B. Digital Subtraction Angiography Prior to an Amputation for Critical Limb Ischemia (CLI): An Expert Recommendation Statement From the CLI Global Society to Optimize Limb Salvage. J Endovasc Ther. 2020 Aug;27(4):540-546. doi: 10.1177/1526602820928590. Epub 2020 May 29. PMID: 70964383.    Thank you for this interesting consult.  I greatly enjoyed meeting Deborah Whitehead and look forward to participating in their care.  A copy of this report was sent to the requesting provider on this date.  Electronically Signed: Corrie Mckusick 12/21/2021, 2:03 PM   I spent a total of  60 Minutes   in face to face in clinical consultation, greater than 50% of which was counseling/coordinating care for left lower extremity CLI, possible angiogram, possible intervention

## 2022-01-17 ENCOUNTER — Ambulatory Visit (INDEPENDENT_AMBULATORY_CARE_PROVIDER_SITE_OTHER): Payer: Medicare Other | Admitting: Podiatry

## 2022-01-17 ENCOUNTER — Other Ambulatory Visit: Payer: Self-pay

## 2022-01-17 DIAGNOSIS — E119 Type 2 diabetes mellitus without complications: Secondary | ICD-10-CM | POA: Diagnosis not present

## 2022-01-17 DIAGNOSIS — I96 Gangrene, not elsewhere classified: Secondary | ICD-10-CM

## 2022-01-17 DIAGNOSIS — I999 Unspecified disorder of circulatory system: Secondary | ICD-10-CM | POA: Diagnosis not present

## 2022-01-17 NOTE — Progress Notes (Signed)
Subjective:  Patient ID: Deborah Whitehead, female    DOB: 1951-09-21,  MRN: [id number]  Chief Complaint  Patient presents with   Wound Check    Left foot wound check     71 y.o. female presents with the above complaint.  Patient presents for follow-up to left hallux gangrene superficial.  Patient states is looking a lot better.  She states she is doing good.  She went to go see Dr. Earleen Newport.  At this time we will continue conservative treatment option as well as his recommendation.   Review of Systems: Negative except as noted in the HPI. Denies N/V/F/Ch.  Past Medical History:  Diagnosis Date   Anxiety    Arthritis    Depression    Fibroids    abd   GERD (gastroesophageal reflux disease)    Hypertension    dr Ayesha Rumpf     immanuel fp   Shortness of breath    Stroke ([place])    memory loss   Substance abuse (HCC)     Current Outpatient Medications:    acetaminophen-codeine (TYLENOL #3) 300-30 MG tablet, Take 1 tablet by mouth every 4 (four) hours as needed for moderate pain., Disp: , Rfl:    amLODipine (NORVASC) 5 MG tablet, Take 5 mg by mouth daily., Disp: , Rfl:    atorvastatin (LIPITOR) 80 MG tablet, Take 1 tablet (80 mg total) by mouth daily at 6 PM., Disp: 30 tablet, Rfl: 0   buPROPion (WELLBUTRIN XL) 150 MG 24 hr tablet, Take 150 mg by mouth daily as needed., Disp: , Rfl:    clopidogrel (PLAVIX) 75 MG tablet, Take 1 tablet (75 mg total) by mouth daily with breakfast., Disp: 30 tablet, Rfl: 1   DULoxetine (CYMBALTA) 60 MG capsule, Take by mouth., Disp: , Rfl:    ELDERBERRY PO, Take 1 tablet by mouth daily., Disp: , Rfl:    furosemide (LASIX) 20 MG tablet, Take 20 mg by mouth daily., Disp: , Rfl:    gabapentin (NEURONTIN) 100 MG capsule, Take 100 mg by mouth 3 (three) times daily., Disp: , Rfl:    LORazepam (ATIVAN) 0.5 MG tablet, Take 0.5 mg by mouth 2 (two) times daily as needed., Disp: , Rfl:    losartan (COZAAR) 100 MG tablet, Take 100 mg by mouth daily., Disp: , Rfl:     metFORMIN (GLUCOPHAGE-XR) 500 MG 24 hr tablet, Take 500 mg by mouth daily., Disp: , Rfl:    omeprazole (PRILOSEC) 20 MG capsule, Take 20 mg by mouth daily., Disp: , Rfl:    warfarin (COUMADIN) 5 MG tablet, Take by mouth., Disp: , Rfl:   Social History   Tobacco Use  Smoking Status Former   Packs/day: 0.25   Years: 15.00   Pack years: 3.75   Types: Cigarettes   Quit date: 06/18/2013   Years since quitting: 8.5  Smokeless Tobacco Never    No Known Allergies Objective:  There were no vitals filed for this visit. There is no height or weight on file to calculate BMI. Constitutional Well developed. Well nourished.  Vascular Dorsalis pedis pulses non palpable bilaterally. Posterior tibial pulses non palpable bilaterally. Capillary refill sluggish to all digits No cyanosis or clubbing noted. Pedal hair growth not present  Neurologic Normal speech. Oriented to person, place, and time. Epicritic sensation to light touch grossly present bilaterally.  Dermatologic No further gangrene noted to the left hallux.  It has completely resolved.  No erythema or purulent drainage noted no malodor present..  Orthopedic:  Normal joint ROM without pain or crepitus bilaterally. No visible deformities. No bony tenderness.   Radiographs: None Assessment:   1. Vascular abnormality   2. Gangrene of left foot ([place])   3. Type 2 diabetes mellitus without complication, without long-term current use of insulin ([place])      Plan:  Patient was evaluated and treated and all questions answered.  Left plantar hallux g that is healing and mostly resolving -I explained to the patient the etiology of gangrene and various treatment options were discussed.  -ABIs PVRs were discussed with the patient.  She does have abnormality in the vascular flow. -She went to Dr. Earleen Newport who evaluated her at this time her wounds have healed therefore we will plan on continuing conservative treatment options.  At this time  patient does not need intervention from vascular standpoint however if there is any recurrence of wound she will see Dr. Earleen Newport for vascular evaluation.  No follow-ups on file.
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
## 2022-04-09 ENCOUNTER — Other Ambulatory Visit: Payer: Self-pay | Admitting: Interventional Radiology

## 2022-04-09 DIAGNOSIS — I96 Gangrene, not elsewhere classified: Secondary | ICD-10-CM

## 2022-05-09 ENCOUNTER — Ambulatory Visit
Admission: RE | Admit: 2022-05-09 | Discharge: 2022-05-09 | Disposition: A | Discharge status: Home / Self Care | Payer: Medicare Other | Source: Ambulatory Visit | Attending: Interventional Radiology | Admitting: Interventional Radiology

## 2022-05-09 ENCOUNTER — Other Ambulatory Visit: Payer: Self-pay | Admitting: Interventional Radiology

## 2022-05-09 ENCOUNTER — Other Ambulatory Visit: Payer: Medicare Other

## 2022-05-09 ENCOUNTER — Encounter: Payer: Self-pay | Admitting: *Deleted

## 2022-05-09 DIAGNOSIS — I96 Gangrene, not elsewhere classified: Secondary | ICD-10-CM

## 2022-05-09 HISTORY — PX: IR RADIOLOGIST EVAL & MGMT: IMG5224

## 2022-05-09 NOTE — Progress Notes (Signed)
Chief Complaint: PAD/CLI, with new left foot pain   Referring Physician(s): Patel,Kevin P  PCP: Dr. Wenda Low   History of Present Illness: Deborah Whitehead is a 71 y.o. female returning today to [facility], for new complaints of left foot pain.     Deborah Whitehead is here today in the clinic by herself.   Hx:  Full consult for prior complaint of CLI/PAD 12/21/2021   Previously sent by Dr. Posey Pronto for left foot wounds.  She previously described developing pain in her foot over the course of months, with a parallel deterioration in her walking frequency.  Only using a wheelchair, she says because of left foot pain.    No remote history of foot wounds.      Denies any resting chest pain, history of MI. She has history of a stroke, with residual left hand contracture/weakness.     She has history of prior tobacco use, HTN.  Interval Hx: Deborah Whitehead is here by herself today, again transported from [facility] facility.   She tells me that her main problem now is pain in the left foot.  She describes pain when standing and transferring for bathing and toileting. She denies any pain at night that wakes her.  Currently she does not have pain.     We performed left ankle/foot xray series today.  This shows insufficiency fracture of the left 5th metatarsal, possibly the 4th metatarsal.   Past Medical History:  Diagnosis Date   Anxiety    Arthritis    Depression    Fibroids    abd   GERD (gastroesophageal reflux disease)    Hypertension    dr Ayesha Rumpf     immanuel fp   Shortness of breath    Stroke ([place])    memory loss   Substance abuse ([place])     Past Surgical History:  Procedure Laterality Date   ABDOMINAL HYSTERECTOMY     CESAREAN SECTION     CHOLECYSTECTOMY     INSERTION OF MESH N/A 06/25/2013   Procedure: INSERTION OF MESH;  Surgeon: Merrie Roof, MD;  Location: [place];  Service: General;  Laterality: N/A;   IR RADIOLOGIST EVAL &  MGMT  12/21/2021   IR RADIOLOGIST EVAL & MGMT  05/09/2022   LOOP RECORDER IMPLANT  03-26-14   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   LOOP RECORDER IMPLANT N/A 03/26/2014   Procedure: LOOP RECORDER IMPLANT;  Surgeon: Deboraha Sprang, MD;  Location[facility] C[facility] LAB;  Service: Cardiovascular;  Laterality: N/A;   TEE WITHOUT CARDIOVERSION N/A 03/25/2014   Procedure: TRANSESOPHAGEAL ECHOCARDIOGRAM (TEE);  Surgeon: Dorothy Spark, MD;  Location: [place];  Service: Cardiovascular;  Laterality: N/A;   VENTRAL HERNIA REPAIR N/A 06/25/2013   Procedure: LAPAROSCOPIC VENTRAL HERNIA;  Surgeon: Merrie Roof, MD;  Location: [place];  Service: General;  Laterality: N/A;    Allergies: Patient has no known allergies.  Medications: Prior to Admission medications   Medication Sig Start Date End Date Taking? Authorizing Provider  acetaminophen-codeine (TYLENOL #3) 300-30 MG tablet Take 1 tablet by mouth every 4 (four) hours as needed for moderate pain.    [provider]  amLODipine (NORVASC) 5 MG tablet Take 5 mg by mouth daily. 08/05/20   [provider]  atorvastatin (LIPITOR) 80 MG tablet Take 1 tablet (80 mg total) by mouth daily at 6 PM. 08/23/20   Andrew Au, MD  buPROPion (  WELLBUTRIN XL) 150 MG 24 hr tablet Take 150 mg by mouth daily as needed. 10/14/21   [provider]  clopidogrel (PLAVIX) 75 MG tablet Take 1 tablet (75 mg total) by mouth daily with breakfast. 07/21/13   Angiulli, Lavon Paganini, PA-C  DULoxetine (CYMBALTA) 60 MG capsule Take by mouth. 10/24/21   [provider]  ELDERBERRY PO Take 1 tablet by mouth daily.    [provider]  furosemide (LASIX) 20 MG tablet Take 20 mg by mouth daily. 08/05/20   [provider]  gabapentin (NEURONTIN) 100 MG capsule Take 100 mg by mouth 3 (three) times daily. 10/28/20   [provider]  LORazepam (ATIVAN) 0.5 MG tablet Take 0.5 mg by mouth 2 (two) times daily as needed. 05/23/21    [provider]  losartan (COZAAR) 100 MG tablet Take 100 mg by mouth daily.    [provider]  metFORMIN (GLUCOPHAGE-XR) 500 MG 24 hr tablet Take 500 mg by mouth daily.    [provider]  omeprazole (PRILOSEC) 20 MG capsule Take 20 mg by mouth daily. 10/28/20   [provider]  warfarin (COUMADIN) 5 MG tablet Take by mouth. 05/17/21   [provider]     Family History  Problem Relation Age of Onset   Cancer Mother        Breast   Hypertension Mother    Diabetes Mother    Hyperlipidemia Mother    Thyroid disease Mother    Cancer Father        Prostate   Asthma Brother     Social History   Socioeconomic History   Marital status: Widowed    Spouse name: Not on file   Number of children: Not on file   Years of education: Not on file   Highest education level: Not on file  Occupational History   Not on file  Tobacco Use   Smoking status: Former    Packs/day: 0.25    Years: 15.00    Pack years: 3.75    Types: Cigarettes    Quit date: 06/18/2013    Years since quitting: 8.8   Smokeless tobacco: Never  Substance and Sexual Activity   Alcohol use: No    Comment: stopped   Drug use: Yes    Types: Cocaine    Comment: stopped x 1 yr   Sexual activity: Not on file  Other Topics Concern   Not on file  Social History Narrative   Not on file   Social Determinants of Health   Financial Resource Strain: Not on file  Food Insecurity: Not on file  Transportation Needs: Not on file  Physical Activity: Not on file  Stress: Not on file  Social Connections: Not on file     Review of Systems: A 12 point ROS discussed and pertinent positives are indicated in the HPI above.  All other systems are negative.  Review of Systems  Vital Signs: BP 129/67 (BP Location: Left Arm)   Pulse 97   SpO2 94%   Physical Exam General: 71 yo female appearing stated age.  Obese, in wheelchair.  Well-developed, well-nourished.  No  distress. HEENT: Atraumatic, normocephalic.  Conjugate gaze, extra-ocular motor intact. No scleral icterus or scleral injection. No lesions on external ears, nose, lips, or gums.  Oral mucosa moist, pink.  Neck: Symmetric with no goiter enlargement.  Chest/Lungs:  Symmetric chest with inspiration/expiration.  No labored breathing.   Heart:   No JVD appreciated.  Abdomen:  Soft, NT/ND, with + bowel sounds.   Genito-urinary: Deferred MSK: reproducible pain at the left ankle with percussion and with plantar/dorsiflexion. No pain on the right. Reduced flexion at the left and right ankles.  Pulse Exam:  Palpable left and right popliteal pulses.  Doppler positive left AT & PT.    Extremities: No wound.     Mallampati Score:     Imaging: DG Ankle 2 Views Left  Result Date: 05/09/2022 CLINICAL DATA:  71 year old female with left foot pain EXAM: LEFT ANKLE - 2 VIEW COMPARISON:  12/26/2020 FINDINGS: Diffuse osteopenia. Age-indeterminate fracture at the base of the fifth metatarsal, with some sclerotic changes at the base of the fourth metatarsal potentially representing a second fracture. Enthesopathic changes at the Achilles insertion and plantar fascia insertion. Degenerative changes of the hindfoot. IMPRESSION: Indeterminate age fracture at the base of the fifth metatarsal, as well as questionable fracture at the base of the fourth metatarsal. Advanced osteopenia. Electronically Signed   By: Corrie Mckusick D.O.   On: 05/09/2022 15:33   DG Foot 2 Views Left  Result Date: 05/09/2022 CLINICAL DATA:  71 year old female with a history of foot pain EXAM: LEFT FOOT - 2 VIEW COMPARISON:  None Available. FINDINGS: Diffuse osteopenia. Fracture at the base of the fifth metatarsal without displacement. There is also cortical irregularity at the head of the fifth metatarsal, potentially a second region of insufficiency fracture. Cortical irregularity at the base of the fourth metatarsal. Degenerative changes of the  hindfoot with enthesopathic changes at the Achilles insertion and plantar fascia insertion IMPRESSION: Insufficiency fracture at the base of the fifth metatarsal, and suspected insufficiency fracture at the base of the fourth metatarsal, age indeterminate. Questionable insufficiency fracture at the head of the fifth metatarsal. Advanced osteopenia. Electronically Signed   By: Corrie Mckusick D.O.   On: 05/09/2022 15:35   IR Radiologist Eval & Mgmt  Result Date: 05/09/2022 Please refer to notes tab for details about interventional procedure. (Op Note)   Labs:  CBC: No results for input(s): WBC, HGB, HCT, PLT in the last 8760 hours.  COAGS: No results for input(s): INR, APTT in the last 8760 hours.  BMP: No results for input(s): NA, K, CL, CO2, GLUCOSE, BUN, CALCIUM, CREATININE, GFRNONAA, GFRAA in the last 8760 hours.  Invalid input(s): CMP  LIVER FUNCTION TESTS: No results for input(s): BILITOT, AST, ALT, ALKPHOS, PROT, ALBUMIN in the last 8760 hours.  TUMOR MARKERS: No results for input(s): AFPTM, CEA, CA199, CHROMGRNA in the last 8760 hours.  Assessment and Plan:  Deborah Azbell is 71 yo female with new left foot pain that is present when standing and transferring.   She has history of left foot wound and PAD/CLI.   Since we saw her in January, she has healed her left foot wounds, with no new wounds.   We performed xrays today of the left ankle/foot, confirming fracture of the base of the left 5th metatarsal, and possible base of the left 4th metatarsal.  This accounts for the symptoms.   Plan: - Continue maximal medical care for her PAD history. - I enclosed recs to [place], to include consideration of orthopedic input given the left foot fractures, her limited mobility, and advanced osteopenia.   Electronically Signed: Corrie Mckusick 05/09/2022, 3:46 PM   I spent a total of    40 Minutes in face to face in clinical consultation, greater than 50% of which was  counseling/coordinating care for left foot pain, 5th metatarsal fracture, history of PAD.

## 2023-08-18 DEATH — deceased
